# Patient Record
Sex: Female | Born: 1975 | Race: White | Hispanic: No | Marital: Married | Smoking: Never smoker
Health system: Southern US, Community
[De-identification: ages and names within clinical notes are randomized; demographics above are authoritative.]

## PROBLEM LIST (undated history)

## (undated) ENCOUNTER — Ambulatory Visit (HOSPITAL_COMMUNITY): Admission: EM | Source: Home / Self Care

## (undated) ENCOUNTER — Ambulatory Visit: Admission: EM

## (undated) DIAGNOSIS — Z789 Other specified health status: Secondary | ICD-10-CM

## (undated) DIAGNOSIS — F319 Bipolar disorder, unspecified: Secondary | ICD-10-CM

## (undated) HISTORY — PX: LEG SKIN LESION  BIOPSY / EXCISION: SUR473

## (undated) HISTORY — PX: OTHER SURGICAL HISTORY: SHX169

## (undated) HISTORY — DX: Bipolar disorder, unspecified: F31.9

---

## 2004-12-04 ENCOUNTER — Other Ambulatory Visit: Admission: RE | Admit: 2004-12-04 | Discharge: 2004-12-04 | Payer: Self-pay | Admitting: Obstetrics and Gynecology

## 2006-05-15 ENCOUNTER — Other Ambulatory Visit: Admission: RE | Admit: 2006-05-15 | Discharge: 2006-05-15 | Payer: Self-pay | Admitting: Obstetrics and Gynecology

## 2007-09-23 ENCOUNTER — Inpatient Hospital Stay (HOSPITAL_COMMUNITY): Admission: AD | Admit: 2007-09-23 | Discharge: 2007-09-23 | Payer: Self-pay | Admitting: Obstetrics and Gynecology

## 2007-09-24 ENCOUNTER — Inpatient Hospital Stay (HOSPITAL_COMMUNITY): Admission: AD | Admit: 2007-09-24 | Discharge: 2007-09-27 | Payer: Self-pay | Admitting: Obstetrics and Gynecology

## 2007-09-25 ENCOUNTER — Encounter (INDEPENDENT_AMBULATORY_CARE_PROVIDER_SITE_OTHER): Payer: Self-pay | Admitting: Obstetrics and Gynecology

## 2009-05-17 ENCOUNTER — Inpatient Hospital Stay (HOSPITAL_COMMUNITY): Admission: AD | Admit: 2009-05-17 | Discharge: 2009-05-20 | Payer: Self-pay | Admitting: Obstetrics and Gynecology

## 2010-04-09 LAB — CBC
HCT: 35.4 % — ABNORMAL LOW (ref 36.0–46.0)
Hemoglobin: 12 g/dL (ref 12.0–15.0)
MCV: 88.3 fL (ref 78.0–100.0)
RBC: 3.89 MIL/uL (ref 3.87–5.11)
WBC: 10.4 10*3/uL (ref 4.0–10.5)
WBC: 7.6 10*3/uL (ref 4.0–10.5)

## 2010-04-09 LAB — RPR: RPR Ser Ql: NONREACTIVE

## 2010-06-04 NOTE — Discharge Summary (Signed)
Janet Welch, Janet Welch                  ACCOUNT NO.:  [id number]   MEDICAL RECORD NO.:  [id number]          PATIENT TYPE:  INP   LOCATION:  9136                          FACILITY:  [facility]   PHYSICIAN:  Guy Sandifer. Henderson Cloud, M.D. DATE OF BIRTH:  [birth year]   DATE OF ADMISSION:  09/24/2007  DATE OF DISCHARGE:                               DISCHARGE SUMMARY   PROCEDURE:  Vacuum extraction.   INDICATIONS AND CONSENT:  This patient is a 35 year old married white  female G1, P0, at 37-5/7 weeks, who is admitted in labor.  Meconium was  noted.  An IUPC was placed for amnioinfusion.  The patient progresses to  complete and pushing.  She has been pushing for approximately 2 hours.  Vertex is at +3 station with caput.  However, there was no progress over  the last 45-60 minutes.  The procedure and options were discussed with  the patient and her husband.  The vacuum extraction with 1:40,000 risk  of severe morbidity or mortality as discussed.  All questions were  answered.  Straight cath was performed.  Kiwi vacuum extractor was  placed.  Over the course of 3-4 uterine contraction study progress was  made.  There was no pop-ups.  Vertex was delivered over a second-degree  midline episiotomy.  Oronasopharynx were suctioned with the DeLee  suction.  Baby was delivered and the cord was immediately clamped and  cut.  The baby's was handed to the awaiting pediatrics team.  A viable  female infant, Apgars of 9 and 9 at 1 and 5 minutes respectively with a  birth weight of 7 pounds 5 ounces.  Arterial cord pH of 7.36 noted.  Placenta with 3 vessels intact.  Estimated blood loss is 500 mL.  Placenta was sent to pathology for meconium staining.  Vagina and cervix  were without lesion.  A second-degree midline episiotomy was repaired.  The patient and infant are stable in labor and delivery room.      Guy Sandifer Henderson Cloud, M.D.  Electronically Signed     JET/MEDQ  D:  09/25/2007  T:  09/25/2007  Job:  [id number]

## 2010-09-05 LAB — HIV ANTIBODY (ROUTINE TESTING W REFLEX): HIV: NONREACTIVE

## 2010-09-05 LAB — RUBELLA ANTIBODY, IGM: Rubella: IMMUNE

## 2010-09-05 LAB — RPR: RPR: NONREACTIVE

## 2010-09-05 LAB — GC/CHLAMYDIA PROBE AMP, GENITAL

## 2010-10-23 LAB — CBC
HCT: 36.8
Hemoglobin: 12.1
MCHC: 32.7
MCHC: 32.8
MCV: 89.8
MCV: 90.1
Platelets: 224
RDW: 14

## 2010-10-23 LAB — CCBB MATERNAL DONOR DRAW

## 2010-11-06 ENCOUNTER — Other Ambulatory Visit (HOSPITAL_COMMUNITY): Payer: Self-pay | Admitting: Obstetrics and Gynecology

## 2010-11-06 DIAGNOSIS — Q632 Ectopic kidney: Secondary | ICD-10-CM

## 2010-11-08 ENCOUNTER — Ambulatory Visit (HOSPITAL_COMMUNITY): Payer: Self-pay

## 2011-01-29 ENCOUNTER — Ambulatory Visit (HOSPITAL_COMMUNITY)
Admission: RE | Admit: 2011-01-29 | Discharge: 2011-01-29 | Disposition: A | Discharge status: Home / Self Care | Payer: No Typology Code available for payment source | Source: Ambulatory Visit | Attending: Obstetrics and Gynecology | Admitting: Obstetrics and Gynecology

## 2011-01-29 DIAGNOSIS — O34599 Maternal care for other abnormalities of gravid uterus, unspecified trimester: Secondary | ICD-10-CM | POA: Insufficient documentation

## 2011-01-29 DIAGNOSIS — Q632 Ectopic kidney: Secondary | ICD-10-CM

## 2011-01-30 ENCOUNTER — Ambulatory Visit (HOSPITAL_COMMUNITY): Payer: Self-pay

## 2011-03-19 LAB — STREP B DNA PROBE: GBS: POSITIVE

## 2011-04-03 ENCOUNTER — Encounter (HOSPITAL_COMMUNITY): Payer: Self-pay | Admitting: *Deleted

## 2011-04-03 ENCOUNTER — Inpatient Hospital Stay (HOSPITAL_COMMUNITY)
Admission: RE | Admit: 2011-04-03 | Discharge: 2011-04-05 | DRG: 775 | Disposition: A | Discharge status: Home / Self Care | Payer: No Typology Code available for payment source | Source: Ambulatory Visit | Attending: Obstetrics and Gynecology | Admitting: Obstetrics and Gynecology

## 2011-04-03 ENCOUNTER — Encounter (HOSPITAL_COMMUNITY): Payer: Self-pay | Admitting: Anesthesiology

## 2011-04-03 ENCOUNTER — Inpatient Hospital Stay (HOSPITAL_COMMUNITY): Payer: No Typology Code available for payment source | Admitting: Anesthesiology

## 2011-04-03 DIAGNOSIS — O09529 Supervision of elderly multigravida, unspecified trimester: Secondary | ICD-10-CM | POA: Diagnosis present

## 2011-04-03 DIAGNOSIS — Z22330 Carrier of Group B streptococcus: Secondary | ICD-10-CM

## 2011-04-03 DIAGNOSIS — O99892 Other specified diseases and conditions complicating childbirth: Secondary | ICD-10-CM | POA: Diagnosis present

## 2011-04-03 HISTORY — DX: Other specified health status: Z78.9

## 2011-04-03 LAB — RPR: RPR Ser Ql: NONREACTIVE

## 2011-04-03 LAB — CBC
HCT: 36.3 % (ref 36.0–46.0)
Hemoglobin: 11.9 g/dL — ABNORMAL LOW (ref 12.0–15.0)
MCH: 28.7 pg (ref 26.0–34.0)
MCHC: 32.8 g/dL (ref 30.0–36.0)
MCV: 87.7 fL (ref 78.0–100.0)
Platelets: 212 10*3/uL (ref 150–400)
RBC: 4.14 MIL/uL (ref 3.87–5.11)
RDW: 14 % (ref 11.5–15.5)
WBC: 8.5 10*3/uL (ref 4.0–10.5)

## 2011-04-03 LAB — POCT FERN TEST: Fern Test: POSITIVE

## 2011-04-03 MED ORDER — CITRIC ACID-SODIUM CITRATE 334-500 MG/5ML PO SOLN: 30.0000 mL | ORAL | Status: DC | PRN

## 2011-04-03 MED ORDER — BISACODYL 10 MG RE SUPP: 10.0000 mg | Freq: Every day | RECTAL | Status: DC | PRN

## 2011-04-03 MED ORDER — SIMETHICONE 80 MG PO CHEW: 80.0000 mg | CHEWABLE_TABLET | ORAL | Status: DC | PRN

## 2011-04-03 MED ORDER — ONDANSETRON HCL 4 MG PO TABS: 4.0000 mg | ORAL_TABLET | ORAL | Status: DC | PRN

## 2011-04-03 MED ORDER — LACTATED RINGERS IV SOLN: 500.0000 mL | Freq: Once | INTRAVENOUS | Status: DC

## 2011-04-03 MED ORDER — PHENYLEPHRINE 40 MCG/ML (10ML) SYRINGE FOR IV PUSH (FOR BLOOD PRESSURE SUPPORT)
80.0000 ug | PREFILLED_SYRINGE | INTRAVENOUS | Status: DC | PRN
  Filled 2011-04-03: qty 2
  Filled 2011-04-03: qty 5

## 2011-04-03 MED ORDER — FENTANYL 2.5 MCG/ML BUPIVACAINE 1/10 % EPIDURAL INFUSION (WH - ANES)
14.0000 mL/h | INTRAMUSCULAR | Status: DC
  Administered 2011-04-03: 14 mL/h via EPIDURAL
  Filled 2011-04-03 (×2): qty 60

## 2011-04-03 MED ORDER — PHENYLEPHRINE 40 MCG/ML (10ML) SYRINGE FOR IV PUSH (FOR BLOOD PRESSURE SUPPORT)
80.0000 ug | PREFILLED_SYRINGE | INTRAVENOUS | Status: DC | PRN
  Filled 2011-04-03: qty 2

## 2011-04-03 MED ORDER — IBUPROFEN 600 MG PO TABS
600.0000 mg | ORAL_TABLET | Freq: Four times a day (QID) | ORAL | Status: DC | PRN
  Filled 2011-04-03: qty 1

## 2011-04-03 MED ORDER — EPHEDRINE 5 MG/ML INJ
10.0000 mg | INTRAVENOUS | Status: DC | PRN
  Filled 2011-04-03: qty 2
  Filled 2011-04-03: qty 4

## 2011-04-03 MED ORDER — DIBUCAINE 1 % RE OINT: 1.0000 "application " | TOPICAL_OINTMENT | RECTAL | Status: DC | PRN

## 2011-04-03 MED ORDER — PENICILLIN G POTASSIUM 5000000 UNITS IJ SOLR
2.5000 10*6.[IU] | INTRAVENOUS | Status: DC
  Administered 2011-04-03: 2.5 10*6.[IU] via INTRAVENOUS
  Filled 2011-04-03 (×3): qty 2.5

## 2011-04-03 MED ORDER — BENZOCAINE-MENTHOL 20-0.5 % EX AERO
INHALATION_SPRAY | CUTANEOUS | Status: AC
  Filled 2011-04-03: qty 56

## 2011-04-03 MED ORDER — OXYCODONE-ACETAMINOPHEN 5-325 MG PO TABS: 1.0000 | ORAL_TABLET | ORAL | Status: DC | PRN

## 2011-04-03 MED ORDER — SENNOSIDES-DOCUSATE SODIUM 8.6-50 MG PO TABS
2.0000 | ORAL_TABLET | Freq: Every day | ORAL | Status: DC
  Administered 2011-04-03 – 2011-04-04 (×2): 2 via ORAL

## 2011-04-03 MED ORDER — SENNOSIDES-DOCUSATE SODIUM 8.6-50 MG PO TABS: 2.0000 | ORAL_TABLET | Freq: Every day | ORAL | Status: DC

## 2011-04-03 MED ORDER — ZOLPIDEM TARTRATE 5 MG PO TABS: 5.0000 mg | ORAL_TABLET | Freq: Every evening | ORAL | Status: DC | PRN

## 2011-04-03 MED ORDER — FENTANYL 2.5 MCG/ML BUPIVACAINE 1/10 % EPIDURAL INFUSION (WH - ANES)
INTRAMUSCULAR | Status: DC | PRN
  Administered 2011-04-03: 14 mL/h via EPIDURAL

## 2011-04-03 MED ORDER — BENZOCAINE-MENTHOL 20-0.5 % EX AERO: 1.0000 "application " | INHALATION_SPRAY | CUTANEOUS | Status: DC | PRN

## 2011-04-03 MED ORDER — PRENATAL MULTIVITAMIN CH: 1.0000 | ORAL_TABLET | Freq: Every day | ORAL | Status: DC

## 2011-04-03 MED ORDER — FLEET ENEMA 7-19 GM/118ML RE ENEM: 1.0000 | ENEMA | Freq: Every day | RECTAL | Status: DC | PRN

## 2011-04-03 MED ORDER — EPHEDRINE 5 MG/ML INJ
10.0000 mg | INTRAVENOUS | Status: DC | PRN
  Filled 2011-04-03: qty 2

## 2011-04-03 MED ORDER — LACTATED RINGERS IV SOLN
INTRAVENOUS | Status: DC
  Administered 2011-04-03: 04:00:00 via INTRAVENOUS

## 2011-04-03 MED ORDER — TETANUS-DIPHTH-ACELL PERTUSSIS 5-2.5-18.5 LF-MCG/0.5 IM SUSP: 0.5000 mL | Freq: Once | INTRAMUSCULAR | Status: DC

## 2011-04-03 MED ORDER — ONDANSETRON HCL 4 MG/2ML IJ SOLN: 4.0000 mg | INTRAMUSCULAR | Status: DC | PRN

## 2011-04-03 MED ORDER — ACETAMINOPHEN 325 MG PO TABS: 650.0000 mg | ORAL_TABLET | ORAL | Status: DC | PRN

## 2011-04-03 MED ORDER — OXYTOCIN 20 UNITS IN LACTATED RINGERS INFUSION - SIMPLE
125.0000 mL/h | Freq: Once | INTRAVENOUS | Status: AC
  Administered 2011-04-03: 125 mL/h via INTRAVENOUS

## 2011-04-03 MED ORDER — OXYTOCIN BOLUS FROM INFUSION
500.0000 mL | Freq: Once | INTRAVENOUS | Status: AC
  Administered 2011-04-03: 500 mL via INTRAVENOUS
  Filled 2011-04-03: qty 500
  Filled 2011-04-03: qty 1000

## 2011-04-03 MED ORDER — PRENATAL MULTIVITAMIN CH
1.0000 | ORAL_TABLET | Freq: Every day | ORAL | Status: DC
  Administered 2011-04-05: 1 via ORAL
  Filled 2011-04-03 (×2): qty 1

## 2011-04-03 MED ORDER — DEXTROSE 5 % IV SOLN
5.0000 10*6.[IU] | Freq: Once | INTRAVENOUS | Status: AC
  Administered 2011-04-03: 5 10*6.[IU] via INTRAVENOUS
  Filled 2011-04-03: qty 5

## 2011-04-03 MED ORDER — DIPHENHYDRAMINE HCL 25 MG PO CAPS: 25.0000 mg | ORAL_CAPSULE | Freq: Four times a day (QID) | ORAL | Status: DC | PRN

## 2011-04-03 MED ORDER — LACTATED RINGERS IV SOLN: 500.0000 mL | INTRAVENOUS | Status: DC | PRN

## 2011-04-03 MED ORDER — WITCH HAZEL-GLYCERIN EX PADS: 1.0000 "application " | MEDICATED_PAD | CUTANEOUS | Status: DC | PRN

## 2011-04-03 MED ORDER — DIPHENHYDRAMINE HCL 50 MG/ML IJ SOLN: 12.5000 mg | INTRAMUSCULAR | Status: DC | PRN

## 2011-04-03 MED ORDER — IBUPROFEN 600 MG PO TABS
600.0000 mg | ORAL_TABLET | Freq: Four times a day (QID) | ORAL | Status: DC
  Administered 2011-04-03: 600 mg via ORAL

## 2011-04-03 MED ORDER — FLEET ENEMA 7-19 GM/118ML RE ENEM: 1.0000 | ENEMA | RECTAL | Status: DC | PRN

## 2011-04-03 MED ORDER — LANOLIN HYDROUS EX OINT: TOPICAL_OINTMENT | CUTANEOUS | Status: DC | PRN

## 2011-04-03 MED ORDER — ONDANSETRON HCL 4 MG/2ML IJ SOLN: 4.0000 mg | Freq: Four times a day (QID) | INTRAMUSCULAR | Status: DC | PRN

## 2011-04-03 MED ORDER — SODIUM BICARBONATE 8.4 % IV SOLN
INTRAVENOUS | Status: DC | PRN
  Administered 2011-04-03: 4 mL via EPIDURAL

## 2011-04-03 MED ORDER — LIDOCAINE HCL (PF) 1 % IJ SOLN
30.0000 mL | INTRAMUSCULAR | Status: DC | PRN
  Filled 2011-04-03 (×2): qty 30

## 2011-04-03 MED ORDER — IBUPROFEN 600 MG PO TABS
600.0000 mg | ORAL_TABLET | Freq: Four times a day (QID) | ORAL | Status: DC
  Administered 2011-04-03 – 2011-04-05 (×8): 600 mg via ORAL
  Filled 2011-04-03 (×8): qty 1

## 2011-04-03 MED ORDER — BENZOCAINE-MENTHOL 20-0.5 % EX AERO
1.0000 "application " | INHALATION_SPRAY | CUTANEOUS | Status: DC | PRN
  Administered 2011-04-03: 1 via TOPICAL

## 2011-04-03 NOTE — Progress Notes (Signed)
Dr. Henderson Cloud notified of pt presenting for labor check.  notified of + fern and GBS+ with VE 4-5/100/-2.  Admit orders received.

## 2011-04-03 NOTE — Progress Notes (Signed)
Pushing since about 6:30am Took about 30 min break in exagerrated Simms Tired, requests help Vtx at +3, ROP D/W pt/husband VE and  risk of severe morbidity or mortality All questions answered

## 2011-04-03 NOTE — H&P (Signed)
Janet Welch is a 36 y.o. female presenting for SROM and UCs starting about 3 am.  No CNS change/epigastric pain. Maternal Medical History:  Reason for admission: Reason for admission: rupture of membranes and contractions.  Contractions: Onset was 3-5 hours ago.    Fetal activity: Perceived fetal activity is normal.      OB History    Grav Para Term Preterm Abortions TAB SAB Ect Mult Living   4 2 2       2      Past Medical History  Diagnosis Date  . No pertinent past medical history    Past Surgical History  Procedure Date  . Scar tissue biopsy    Family History: family history is not on file. Social History:  reports that she has never smoked. She does not have any smokeless tobacco history on file. She reports that she does not drink alcohol or use illicit drugs.  Review of Systems  Eyes: Negative for blurred vision.  Gastrointestinal: Negative for abdominal pain.  Neurological: Negative for headaches.    Dilation: 7 Effacement (%): 100 Station: 0 Exam by:: H.Norton, RN  Blood pressure 105/38, pulse 69, temperature 97.8 F (36.6 C), temperature source Oral, resp. rate 20, height 5\' 7"  (1.702 m), weight 81.647 kg (180 lb), last menstrual period 06/30/2010, SpO2 100.00%. Maternal Exam:  Uterine Assessment: Contraction strength is firm.  Contraction frequency is regular.      Fetal Exam Fetal Monitor Review: Pattern: accelerations present.       Physical Exam  Cardiovascular: Normal rate and regular rhythm.   Respiratory: Effort normal and breath sounds normal.  GI: There is no tenderness.  Neurological: She has normal reflexes.   Cx  Lt rim/C/+1  (+2 with UC) Prenatal labs: ABO, Rh: B/Positive/-- (08/16 0000) Antibody: Negative (08/16 0000) Rubella: Immune (08/16 0000) RPR: Nonreactive (08/16 0000)  HBsAg: Negative (08/16 0000)  HIV: Non-reactive (08/16 0000)  GBS: Positive (02/27 0000)   Assessment/Plan: 36 yo G4P2 at 41 4/7 weeks with SROM and  labor. Anticipate vaginal delivery.  Juley Giovanetti II,Lashell Moffitt E 04/03/2011, 6:32 AM

## 2011-04-03 NOTE — Progress Notes (Signed)
Delivery Note Foley cath removed Kiwi applied-handle came apart, separated from tubing Another Kiwi applied, steady progress, popoff x 2 ROT, asynclytic VMI Apgars 8/9, weight 8#  11oz Art pH insufficient blood per lab report Placenta 3 vessels, intact, EBL 350cc cx vagina intact Second degree MLE repaired Pt/infant stable in LDR

## 2011-04-03 NOTE — Anesthesia Preprocedure Evaluation (Signed)

## 2011-04-03 NOTE — Anesthesia Procedure Notes (Signed)

## 2011-04-03 NOTE — MAU Note (Signed)
Pt G3 P2 , SVE 4-5cm today in the office.  Contracting every .

## 2011-04-03 NOTE — Progress Notes (Signed)
Pt may go to room 169. 

## 2011-04-04 ENCOUNTER — Encounter (HOSPITAL_COMMUNITY): Payer: Self-pay | Admitting: *Deleted

## 2011-04-04 LAB — CBC
HCT: 36.8 % (ref 36.0–46.0)
Hemoglobin: 11.8 g/dL — ABNORMAL LOW (ref 12.0–15.0)
MCH: 28.4 pg (ref 26.0–34.0)
MCHC: 32.1 g/dL (ref 30.0–36.0)
MCV: 88.7 fL (ref 78.0–100.0)
RBC: 4.15 MIL/uL (ref 3.87–5.11)

## 2011-04-04 NOTE — Addendum Note (Signed)
Addendum  created 04/04/11 1446 by Lorenzo Pereyra O Claris Pech, CRNA   Modules edited:Charges VN, Notes Section    

## 2011-04-04 NOTE — Addendum Note (Signed)
Addendum  created 04/04/11 1446 by Graciela Husbands, CRNA   Modules edited:Charges VN, Notes Section

## 2011-04-04 NOTE — Progress Notes (Signed)
Post Partum Day 1 Subjective: no complaints and tolerating PO  Objective: Blood pressure 113/63, pulse 62, temperature 97.4 F (36.3 C), temperature source Oral, resp. rate 18, height 5\' 7"  (1.702 m), weight 81.647 kg (180 lb), last menstrual period 06/30/2010, SpO2 100.00%, unknown if currently breastfeeding.  Physical Exam:  General: alert and cooperative Lochia: appropriate Uterine Fundus: firm Incision: n/a DVT Evaluation: No evidence of DVT seen on physical exam.   Basename 04/04/11 0510 04/03/11 0417  HGB 11.8* 11.9*  HCT 36.8 36.3    Assessment/Plan: Plan for discharge tomorrow   LOS: 1 day   Janet Welch 04/04/2011, 9:03 AM

## 2011-04-04 NOTE — Anesthesia Postprocedure Evaluation (Signed)
  Anesthesia Post-op Note  Patient: Janet Welch  Procedure(s) Performed: * No procedures listed *  Patient Location: 131  Anesthesia Type: Epidural  Level of Consciousness: awake, alert  and oriented  Airway and Oxygen Therapy: Patient Spontanous Breathing  Post-op Pain: mild  Post-op Assessment: Post-op Vital signs reviewed, Patient's Cardiovascular Status Stable, No headache, No backache, No residual numbness and No residual motor weakness  Post-op Vital Signs: Reviewed and stable  Complications: No apparent anesthesia complications

## 2011-04-05 NOTE — Discharge Summary (Signed)
Obstetric Discharge Summary Reason for Admission: onset of labor Prenatal Procedures: ultrasound Intrapartum Procedures: spontaneous vaginal delivery Postpartum Procedures: none Complications-Operative and Postpartum: none Hemoglobin  Date Value Range Status  04/04/2011 11.8* 12.0-15.0 (g/dL) Final     HCT  Date Value Range Status  04/04/2011 36.8  36.0-46.0 (%) Final    Physical Exam:  General: alert and cooperative Lochia: appropriate Uterine Fundus: firm Incision: n/a DVT Evaluation: No evidence of DVT seen on physical exam.  Discharge Diagnoses: Term Pregnancy-delivered  Discharge Information: Date: 04/05/2011 Activity: pelvic rest Diet: routine Medications: PNV and Ibuprofen Condition: stable Instructions: refer to practice specific booklet Discharge to: home Follow-up Information    Schedule an appointment as soon as possible for a visit in 5 weeks to follow up.         Newborn Data: Live born female  Birth Weight: 8 lb 11.7 oz (3960 g) APGAR: 8, 9  Home with mother.  Alfio Loescher 04/05/2011, 9:27 AM

## 2011-10-10 ENCOUNTER — Other Ambulatory Visit: Payer: Self-pay | Admitting: Podiatry

## 2012-12-03 LAB — OB RESULTS CONSOLE HIV ANTIBODY (ROUTINE TESTING): HIV: NONREACTIVE

## 2012-12-03 LAB — OB RESULTS CONSOLE ABO/RH: RH TYPE: POSITIVE

## 2012-12-03 LAB — OB RESULTS CONSOLE ANTIBODY SCREEN: Antibody Screen: NEGATIVE

## 2012-12-03 LAB — OB RESULTS CONSOLE RPR: RPR: NONREACTIVE

## 2012-12-03 LAB — OB RESULTS CONSOLE RUBELLA ANTIBODY, IGM: RUBELLA: IMMUNE

## 2012-12-03 LAB — OB RESULTS CONSOLE HEPATITIS B SURFACE ANTIGEN: Hepatitis B Surface Ag: NEGATIVE

## 2012-12-15 LAB — OB RESULTS CONSOLE GC/CHLAMYDIA
Chlamydia: NEGATIVE
Gonorrhea: NEGATIVE

## 2013-06-15 LAB — OB RESULTS CONSOLE GBS: STREP GROUP B AG: POSITIVE

## 2013-07-06 ENCOUNTER — Inpatient Hospital Stay (HOSPITAL_COMMUNITY)
Admission: AD | Admit: 2013-07-06 | Discharge: 2013-07-10 | DRG: 766 | Disposition: A | Discharge status: Home / Self Care | Payer: BC Managed Care – PPO | Source: Ambulatory Visit | Attending: Obstetrics & Gynecology | Admitting: Obstetrics & Gynecology

## 2013-07-06 ENCOUNTER — Encounter (HOSPITAL_COMMUNITY): Payer: Self-pay | Admitting: *Deleted

## 2013-07-06 DIAGNOSIS — O99892 Other specified diseases and conditions complicating childbirth: Secondary | ICD-10-CM | POA: Diagnosis present

## 2013-07-06 DIAGNOSIS — O324XX0 Maternal care for high head at term, not applicable or unspecified: Secondary | ICD-10-CM | POA: Diagnosis present

## 2013-07-06 DIAGNOSIS — IMO0001 Reserved for inherently not codable concepts without codable children: Secondary | ICD-10-CM

## 2013-07-06 DIAGNOSIS — Z302 Encounter for sterilization: Secondary | ICD-10-CM

## 2013-07-06 DIAGNOSIS — O9989 Other specified diseases and conditions complicating pregnancy, childbirth and the puerperium: Secondary | ICD-10-CM

## 2013-07-06 DIAGNOSIS — O09529 Supervision of elderly multigravida, unspecified trimester: Secondary | ICD-10-CM | POA: Diagnosis present

## 2013-07-06 DIAGNOSIS — Z22330 Carrier of Group B streptococcus: Secondary | ICD-10-CM

## 2013-07-06 DIAGNOSIS — Z98891 History of uterine scar from previous surgery: Secondary | ICD-10-CM

## 2013-07-06 LAB — CBC
HCT: 37 % (ref 36.0–46.0)
HEMOGLOBIN: 12.4 g/dL (ref 12.0–15.0)
MCH: 29.1 pg (ref 26.0–34.0)
MCHC: 33.5 g/dL (ref 30.0–36.0)
MCV: 86.9 fL (ref 78.0–100.0)
Platelets: 261 10*3/uL (ref 150–400)
RBC: 4.26 MIL/uL (ref 3.87–5.11)
RDW: 13.9 % (ref 11.5–15.5)
WBC: 10.6 10*3/uL — ABNORMAL HIGH (ref 4.0–10.5)

## 2013-07-06 MED ORDER — CITRIC ACID-SODIUM CITRATE 334-500 MG/5ML PO SOLN
30.0000 mL | ORAL | Status: DC | PRN
  Administered 2013-07-07: 30 mL via ORAL
  Filled 2013-07-06: qty 15

## 2013-07-06 MED ORDER — LACTATED RINGERS IV SOLN: 500.0000 mL | INTRAVENOUS | Status: DC | PRN

## 2013-07-06 MED ORDER — ACETAMINOPHEN 325 MG PO TABS: 650.0000 mg | ORAL_TABLET | ORAL | Status: DC | PRN

## 2013-07-06 MED ORDER — PENICILLIN G POTASSIUM 5000000 UNITS IJ SOLR
2.5000 10*6.[IU] | INTRAVENOUS | Status: DC
  Administered 2013-07-07 (×4): 2.5 10*6.[IU] via INTRAVENOUS
  Filled 2013-07-06 (×7): qty 2.5

## 2013-07-06 MED ORDER — LACTATED RINGERS IV SOLN
500.0000 mL | Freq: Once | INTRAVENOUS | Status: AC
  Administered 2013-07-07: 500 mL via INTRAVENOUS

## 2013-07-06 MED ORDER — DIPHENHYDRAMINE HCL 50 MG/ML IJ SOLN: 12.5000 mg | INTRAMUSCULAR | Status: DC | PRN

## 2013-07-06 MED ORDER — EPHEDRINE 5 MG/ML INJ
10.0000 mg | INTRAVENOUS | Status: DC | PRN
  Filled 2013-07-06: qty 2
  Filled 2013-07-06: qty 4

## 2013-07-06 MED ORDER — EPHEDRINE 5 MG/ML INJ
10.0000 mg | INTRAVENOUS | Status: DC | PRN
  Filled 2013-07-06: qty 2

## 2013-07-06 MED ORDER — ONDANSETRON HCL 4 MG/2ML IJ SOLN: 4.0000 mg | Freq: Four times a day (QID) | INTRAMUSCULAR | Status: DC | PRN

## 2013-07-06 MED ORDER — IBUPROFEN 600 MG PO TABS: 600.0000 mg | ORAL_TABLET | Freq: Four times a day (QID) | ORAL | Status: DC | PRN

## 2013-07-06 MED ORDER — OXYCODONE-ACETAMINOPHEN 5-325 MG PO TABS: 1.0000 | ORAL_TABLET | ORAL | Status: DC | PRN

## 2013-07-06 MED ORDER — PHENYLEPHRINE 40 MCG/ML (10ML) SYRINGE FOR IV PUSH (FOR BLOOD PRESSURE SUPPORT)
80.0000 ug | PREFILLED_SYRINGE | INTRAVENOUS | Status: DC | PRN
  Filled 2013-07-06: qty 2

## 2013-07-06 MED ORDER — OXYTOCIN 40 UNITS IN LACTATED RINGERS INFUSION - SIMPLE MED
62.5000 mL/h | INTRAVENOUS | Status: DC
Start: 2013-07-06 — End: 2013-07-07

## 2013-07-06 MED ORDER — OXYTOCIN BOLUS FROM INFUSION: 500.0000 mL | INTRAVENOUS | Status: DC

## 2013-07-06 MED ORDER — PENICILLIN G POTASSIUM 5000000 UNITS IJ SOLR
5.0000 10*6.[IU] | Freq: Once | INTRAVENOUS | Status: AC
  Administered 2013-07-06: 5 10*6.[IU] via INTRAVENOUS
  Filled 2013-07-06: qty 5

## 2013-07-06 MED ORDER — LIDOCAINE HCL (PF) 1 % IJ SOLN: 30.0000 mL | INTRAMUSCULAR | Status: DC | PRN

## 2013-07-06 MED ORDER — FLEET ENEMA 7-19 GM/118ML RE ENEM: 1.0000 | ENEMA | RECTAL | Status: DC | PRN

## 2013-07-06 MED ORDER — PHENYLEPHRINE 40 MCG/ML (10ML) SYRINGE FOR IV PUSH (FOR BLOOD PRESSURE SUPPORT)
80.0000 ug | PREFILLED_SYRINGE | INTRAVENOUS | Status: DC | PRN
  Filled 2013-07-06: qty 2
  Filled 2013-07-06: qty 10

## 2013-07-06 MED ORDER — LACTATED RINGERS IV SOLN
INTRAVENOUS | Status: DC
  Administered 2013-07-06: 23:00:00 via INTRAVENOUS
  Administered 2013-07-07: 125 mL/h via INTRAVENOUS
  Administered 2013-07-07 (×5): via INTRAVENOUS

## 2013-07-06 MED ORDER — FENTANYL 2.5 MCG/ML BUPIVACAINE 1/10 % EPIDURAL INFUSION (WH - ANES)
14.0000 mL/h | INTRAMUSCULAR | Status: DC | PRN
  Administered 2013-07-07 (×2): 14 mL/h via EPIDURAL
  Filled 2013-07-06 (×2): qty 125

## 2013-07-07 ENCOUNTER — Encounter (HOSPITAL_COMMUNITY)
Admission: AD | Disposition: A | Discharge status: Home / Self Care | Payer: Self-pay | Source: Ambulatory Visit | Attending: Obstetrics & Gynecology

## 2013-07-07 ENCOUNTER — Inpatient Hospital Stay (HOSPITAL_COMMUNITY): Payer: BC Managed Care – PPO | Admitting: Anesthesiology

## 2013-07-07 ENCOUNTER — Encounter (HOSPITAL_COMMUNITY): Payer: BC Managed Care – PPO | Admitting: Anesthesiology

## 2013-07-07 ENCOUNTER — Inpatient Hospital Stay (HOSPITAL_COMMUNITY): Admission: RE | Admit: 2013-07-07 | Payer: No Typology Code available for payment source | Source: Ambulatory Visit

## 2013-07-07 ENCOUNTER — Encounter (HOSPITAL_COMMUNITY): Payer: Self-pay | Admitting: *Deleted

## 2013-07-07 DIAGNOSIS — Z98891 History of uterine scar from previous surgery: Secondary | ICD-10-CM

## 2013-07-07 LAB — RPR

## 2013-07-07 SURGERY — Surgical Case
Anesthesia: Epidural

## 2013-07-07 MED ORDER — LACTATED RINGERS IV SOLN
INTRAVENOUS | Status: DC
  Administered 2013-07-08: 05:00:00 via INTRAVENOUS

## 2013-07-07 MED ORDER — MEPERIDINE HCL 25 MG/ML IJ SOLN: 6.2500 mg | INTRAMUSCULAR | Status: DC | PRN

## 2013-07-07 MED ORDER — SIMETHICONE 80 MG PO CHEW
80.0000 mg | CHEWABLE_TABLET | ORAL | Status: DC
  Filled 2013-07-07 (×2): qty 1

## 2013-07-07 MED ORDER — IBUPROFEN 600 MG PO TABS: 600.0000 mg | ORAL_TABLET | Freq: Four times a day (QID) | ORAL | Status: DC | PRN

## 2013-07-07 MED ORDER — SODIUM BICARBONATE 8.4 % IV SOLN
INTRAVENOUS | Status: DC | PRN
  Administered 2013-07-07 (×2): 5 mL via EPIDURAL

## 2013-07-07 MED ORDER — DIPHENHYDRAMINE HCL 25 MG PO CAPS: 25.0000 mg | ORAL_CAPSULE | Freq: Four times a day (QID) | ORAL | Status: DC | PRN

## 2013-07-07 MED ORDER — MENTHOL 3 MG MT LOZG: 1.0000 | LOZENGE | OROMUCOSAL | Status: DC | PRN

## 2013-07-07 MED ORDER — PRENATAL MULTIVITAMIN CH
1.0000 | ORAL_TABLET | Freq: Every day | ORAL | Status: DC
  Administered 2013-07-08 – 2013-07-09 (×2): 1 via ORAL
  Filled 2013-07-07 (×2): qty 1

## 2013-07-07 MED ORDER — OXYTOCIN 40 UNITS IN LACTATED RINGERS INFUSION - SIMPLE MED: 62.5000 mL/h | INTRAVENOUS | Status: AC

## 2013-07-07 MED ORDER — KETOROLAC TROMETHAMINE 30 MG/ML IJ SOLN
30.0000 mg | Freq: Four times a day (QID) | INTRAMUSCULAR | Status: AC | PRN
  Administered 2013-07-07: 30 mg via INTRAMUSCULAR

## 2013-07-07 MED ORDER — ACETAMINOPHEN 500 MG PO TABS: 1000.0000 mg | ORAL_TABLET | Freq: Four times a day (QID) | ORAL | Status: AC

## 2013-07-07 MED ORDER — SODIUM CHLORIDE 0.9 % IJ SOLN: 3.0000 mL | INTRAMUSCULAR | Status: DC | PRN

## 2013-07-07 MED ORDER — ZOLPIDEM TARTRATE 5 MG PO TABS: 5.0000 mg | ORAL_TABLET | Freq: Every evening | ORAL | Status: DC | PRN

## 2013-07-07 MED ORDER — ONDANSETRON HCL 4 MG/2ML IJ SOLN
INTRAMUSCULAR | Status: AC
  Filled 2013-07-07: qty 2

## 2013-07-07 MED ORDER — DIPHENHYDRAMINE HCL 50 MG/ML IJ SOLN: 25.0000 mg | INTRAMUSCULAR | Status: DC | PRN

## 2013-07-07 MED ORDER — ONDANSETRON HCL 4 MG/2ML IJ SOLN
INTRAMUSCULAR | Status: DC | PRN
  Administered 2013-07-07: 4 mg via INTRAVENOUS

## 2013-07-07 MED ORDER — NALBUPHINE HCL 10 MG/ML IJ SOLN: 5.0000 mg | INTRAMUSCULAR | Status: DC | PRN

## 2013-07-07 MED ORDER — MORPHINE SULFATE (PF) 0.5 MG/ML IJ SOLN
INTRAMUSCULAR | Status: DC | PRN
  Administered 2013-07-07: 4 mg via EPIDURAL

## 2013-07-07 MED ORDER — KETOROLAC TROMETHAMINE 30 MG/ML IJ SOLN
INTRAMUSCULAR | Status: AC
  Administered 2013-07-07: 30 mg via INTRAMUSCULAR
  Filled 2013-07-07: qty 1

## 2013-07-07 MED ORDER — CEFAZOLIN SODIUM-DEXTROSE 2-3 GM-% IV SOLR
2.0000 g | Freq: Once | INTRAVENOUS | Status: AC
  Administered 2013-07-07 (×2): 2 g via INTRAVENOUS
  Filled 2013-07-07: qty 50

## 2013-07-07 MED ORDER — TERBUTALINE SULFATE 1 MG/ML IJ SOLN
0.2500 mg | Freq: Once | INTRAMUSCULAR | Status: DC | PRN
Start: 2013-07-07 — End: 2013-07-07

## 2013-07-07 MED ORDER — LANOLIN HYDROUS EX OINT: 1.0000 "application " | TOPICAL_OINTMENT | CUTANEOUS | Status: DC | PRN

## 2013-07-07 MED ORDER — KETOROLAC TROMETHAMINE 30 MG/ML IJ SOLN
30.0000 mg | Freq: Four times a day (QID) | INTRAMUSCULAR | Status: AC | PRN
  Administered 2013-07-08: 30 mg via INTRAVENOUS
  Filled 2013-07-07: qty 1

## 2013-07-07 MED ORDER — SCOPOLAMINE 1 MG/3DAYS TD PT72
MEDICATED_PATCH | TRANSDERMAL | Status: AC
  Administered 2013-07-07: 1.5 mg via TRANSDERMAL
  Filled 2013-07-07: qty 1

## 2013-07-07 MED ORDER — DIPHENHYDRAMINE HCL 50 MG/ML IJ SOLN: 12.5000 mg | INTRAMUSCULAR | Status: DC | PRN

## 2013-07-07 MED ORDER — NALOXONE HCL 1 MG/ML IJ SOLN: 1.0000 ug/kg/h | INTRAVENOUS | Status: DC | PRN

## 2013-07-07 MED ORDER — ONDANSETRON HCL 4 MG/2ML IJ SOLN: 4.0000 mg | Freq: Three times a day (TID) | INTRAMUSCULAR | Status: DC | PRN

## 2013-07-07 MED ORDER — FENTANYL 2.5 MCG/ML BUPIVACAINE 1/10 % EPIDURAL INFUSION (WH - ANES)
INTRAMUSCULAR | Status: DC | PRN
  Administered 2013-07-07: 14 mL/h via EPIDURAL

## 2013-07-07 MED ORDER — SIMETHICONE 80 MG PO CHEW
80.0000 mg | CHEWABLE_TABLET | ORAL | Status: DC | PRN
  Administered 2013-07-08: 80 mg via ORAL

## 2013-07-07 MED ORDER — OXYTOCIN 10 UNIT/ML IJ SOLN
INTRAMUSCULAR | Status: AC
  Filled 2013-07-07: qty 4

## 2013-07-07 MED ORDER — MORPHINE SULFATE 0.5 MG/ML IJ SOLN
INTRAMUSCULAR | Status: AC
  Filled 2013-07-07: qty 10

## 2013-07-07 MED ORDER — DIBUCAINE 1 % RE OINT: 1.0000 "application " | TOPICAL_OINTMENT | RECTAL | Status: DC | PRN

## 2013-07-07 MED ORDER — OXYTOCIN 10 UNIT/ML IJ SOLN
40.0000 [IU] | INTRAVENOUS | Status: DC | PRN
  Administered 2013-07-07: 40 [IU] via INTRAVENOUS

## 2013-07-07 MED ORDER — SODIUM BICARBONATE 8.4 % IV SOLN
INTRAVENOUS | Status: AC
  Filled 2013-07-07: qty 50

## 2013-07-07 MED ORDER — NALOXONE HCL 0.4 MG/ML IJ SOLN: 0.4000 mg | INTRAMUSCULAR | Status: DC | PRN

## 2013-07-07 MED ORDER — LACTATED RINGERS IV SOLN
INTRAVENOUS | Status: DC | PRN
  Administered 2013-07-07: 16:00:00 via INTRAVENOUS

## 2013-07-07 MED ORDER — OXYTOCIN 40 UNITS IN LACTATED RINGERS INFUSION - SIMPLE MED
1.0000 m[IU]/min | INTRAVENOUS | Status: DC
Start: 2013-07-07 — End: 2013-07-07
  Administered 2013-07-07: 2 m[IU]/min via INTRAVENOUS
  Filled 2013-07-07: qty 1000

## 2013-07-07 MED ORDER — MEPERIDINE HCL 25 MG/ML IJ SOLN
INTRAMUSCULAR | Status: AC
  Filled 2013-07-07: qty 1

## 2013-07-07 MED ORDER — LIDOCAINE HCL (PF) 1 % IJ SOLN
INTRAMUSCULAR | Status: DC | PRN
  Administered 2013-07-07 (×2): 5 mL
  Administered 2013-07-07: 3 mL

## 2013-07-07 MED ORDER — ONDANSETRON HCL 4 MG/2ML IJ SOLN
4.0000 mg | INTRAMUSCULAR | Status: DC | PRN
  Administered 2013-07-07: 4 mg via INTRAVENOUS

## 2013-07-07 MED ORDER — SIMETHICONE 80 MG PO CHEW
80.0000 mg | CHEWABLE_TABLET | Freq: Three times a day (TID) | ORAL | Status: DC
  Administered 2013-07-08 – 2013-07-10 (×5): 80 mg via ORAL
  Filled 2013-07-07 (×3): qty 1

## 2013-07-07 MED ORDER — LIDOCAINE-EPINEPHRINE (PF) 2 %-1:200000 IJ SOLN
INTRAMUSCULAR | Status: AC
  Filled 2013-07-07: qty 20

## 2013-07-07 MED ORDER — FENTANYL CITRATE 0.05 MG/ML IJ SOLN: 25.0000 ug | INTRAMUSCULAR | Status: DC | PRN

## 2013-07-07 MED ORDER — SENNOSIDES-DOCUSATE SODIUM 8.6-50 MG PO TABS
2.0000 | ORAL_TABLET | ORAL | Status: DC
  Administered 2013-07-08 – 2013-07-09 (×2): 2 via ORAL
  Filled 2013-07-07 (×2): qty 2

## 2013-07-07 MED ORDER — SCOPOLAMINE 1 MG/3DAYS TD PT72
1.0000 | MEDICATED_PATCH | Freq: Once | TRANSDERMAL | Status: DC
  Administered 2013-07-07: 1.5 mg via TRANSDERMAL

## 2013-07-07 MED ORDER — PROMETHAZINE HCL 25 MG/ML IJ SOLN: 6.2500 mg | INTRAMUSCULAR | Status: DC | PRN

## 2013-07-07 MED ORDER — TETANUS-DIPHTH-ACELL PERTUSSIS 5-2.5-18.5 LF-MCG/0.5 IM SUSP
0.5000 mL | Freq: Once | INTRAMUSCULAR | Status: DC
Start: 2013-07-08 — End: 2013-07-08

## 2013-07-07 MED ORDER — METOCLOPRAMIDE HCL 5 MG/ML IJ SOLN
10.0000 mg | Freq: Three times a day (TID) | INTRAMUSCULAR | Status: DC | PRN
Start: 2013-07-07 — End: 2013-07-10
  Administered 2013-07-08: 10 mg via INTRAVENOUS
  Filled 2013-07-07: qty 2

## 2013-07-07 MED ORDER — DIPHENHYDRAMINE HCL 25 MG PO CAPS: 25.0000 mg | ORAL_CAPSULE | ORAL | Status: DC | PRN

## 2013-07-07 MED ORDER — WITCH HAZEL-GLYCERIN EX PADS: 1.0000 "application " | MEDICATED_PAD | CUTANEOUS | Status: DC | PRN

## 2013-07-07 MED ORDER — IBUPROFEN 600 MG PO TABS
600.0000 mg | ORAL_TABLET | Freq: Four times a day (QID) | ORAL | Status: DC
Start: 2013-07-08 — End: 2013-07-10
  Administered 2013-07-08 – 2013-07-10 (×9): 600 mg via ORAL
  Filled 2013-07-07 (×9): qty 1

## 2013-07-07 MED ORDER — MEPERIDINE HCL 25 MG/ML IJ SOLN
INTRAMUSCULAR | Status: DC | PRN
  Administered 2013-07-07 (×2): 12.5 mg via INTRAVENOUS

## 2013-07-07 MED ORDER — ONDANSETRON HCL 4 MG PO TABS: 4.0000 mg | ORAL_TABLET | ORAL | Status: DC | PRN

## 2013-07-07 MED ORDER — MIDAZOLAM HCL 2 MG/2ML IJ SOLN: 0.5000 mg | Freq: Once | INTRAMUSCULAR | Status: DC | PRN

## 2013-07-07 MED ORDER — OXYCODONE-ACETAMINOPHEN 5-325 MG PO TABS
1.0000 | ORAL_TABLET | ORAL | Status: DC | PRN
Start: 2013-07-07 — End: 2013-07-10
  Administered 2013-07-08 – 2013-07-09 (×3): 1 via ORAL
  Administered 2013-07-09: 2 via ORAL
  Administered 2013-07-09 – 2013-07-10 (×4): 1 via ORAL
  Filled 2013-07-07: qty 1
  Filled 2013-07-07: qty 2
  Filled 2013-07-07 (×7): qty 1

## 2013-07-07 SURGICAL SUPPLY — 34 items
BENZOIN TINCTURE PRP APPL 2/3 (GAUZE/BANDAGES/DRESSINGS) ×2 IMPLANT
CLAMP CORD UMBIL (MISCELLANEOUS) IMPLANT
CLOSURE STERI STRIP 1/2 X4 (GAUZE/BANDAGES/DRESSINGS) ×2 IMPLANT
CLOTH BEACON ORANGE TIMEOUT ST (SAFETY) ×2 IMPLANT
DERMABOND ADVANCED (GAUZE/BANDAGES/DRESSINGS)
DERMABOND ADVANCED .7 DNX12 (GAUZE/BANDAGES/DRESSINGS) IMPLANT
DRAPE LG THREE QUARTER DISP (DRAPES) IMPLANT
DRSG OPSITE POSTOP 4X10 (GAUZE/BANDAGES/DRESSINGS) ×2 IMPLANT
DURAPREP 26ML APPLICATOR (WOUND CARE) ×2 IMPLANT
ELECT REM PT RETURN 9FT ADLT (ELECTROSURGICAL) ×2
ELECTRODE REM PT RTRN 9FT ADLT (ELECTROSURGICAL) ×1 IMPLANT
EXTRACTOR VACUUM KIWI (MISCELLANEOUS) IMPLANT
EXTRACTOR VACUUM M CUP 4 TUBE (SUCTIONS) IMPLANT
GLOVE BIO SURGEON STRL SZ 6 (GLOVE) ×2 IMPLANT
GLOVE BIOGEL PI IND STRL 6 (GLOVE) ×2 IMPLANT
GLOVE BIOGEL PI INDICATOR 6 (GLOVE) ×2
GOWN STRL REUS W/TWL LRG LVL3 (GOWN DISPOSABLE) ×4 IMPLANT
KIT ABG SYR 3ML LUER SLIP (SYRINGE) ×2 IMPLANT
NEEDLE HYPO 25X5/8 SAFETYGLIDE (NEEDLE) ×2 IMPLANT
NS IRRIG 1000ML POUR BTL (IV SOLUTION) ×2 IMPLANT
PACK C SECTION WH (CUSTOM PROCEDURE TRAY) ×2 IMPLANT
PAD OB MATERNITY 4.3X12.25 (PERSONAL CARE ITEMS) ×2 IMPLANT
STAPLER VISISTAT 35W (STAPLE) IMPLANT
SUT CHROMIC 0 CTX 36 (SUTURE) ×6 IMPLANT
SUT MON AB 2-0 CT1 27 (SUTURE) ×2 IMPLANT
SUT PDS AB 0 CT1 27 (SUTURE) IMPLANT
SUT PLAIN 0 NONE (SUTURE) ×2 IMPLANT
SUT VIC AB 0 CT1 27 (SUTURE) ×1
SUT VIC AB 0 CT1 27XBRD ANBCTR (SUTURE) ×1 IMPLANT
SUT VIC AB 0 CT1 36 (SUTURE) IMPLANT
SUT VIC AB 4-0 KS 27 (SUTURE) ×2 IMPLANT
TOWEL OR 17X24 6PK STRL BLUE (TOWEL DISPOSABLE) ×2 IMPLANT
TRAY FOLEY CATH 14FR (SET/KITS/TRAYS/PACK) IMPLANT
WATER STERILE IRR 1000ML POUR (IV SOLUTION) ×2 IMPLANT

## 2013-07-07 NOTE — Transfer of Care (Signed)
Immediate Anesthesia Transfer of Care Note  Patient: Janet Welch  Procedure(s) Performed: Procedure(s): CESAREAN SECTION (N/A)  Patient Location: PACU  Anesthesia Type:Epidural  Level of Consciousness: awake  Airway & Oxygen Therapy: Patient Spontanous Breathing  Post-op Assessment: Report given to PACU RN and Post -op Vital signs reviewed and stable  Post vital signs: stable  Complications: No apparent anesthesia complications

## 2013-07-07 NOTE — Op Note (Signed)
[facility] PROCEDURE DATE: 07/06/2013 - 07/07/2013  PREOPERATIVE DIAGNOSIS: Intrauterine pregnancy at  [redacted]w[redacted]d weeks gestation with arrest of descent and non-reassuring fetal heart tracing; desires permanent sterilization  POSTOPERATIVE DIAGNOSIS: The same  PROCEDURE:  Primary  Low Transverse Cesarean Section with Bilateral Tubal Ligation  SURGEON:  Dr. Linda Hedges  INDICATIONS: Janet Welch is a 38 y.o. [phone] at [redacted]w[redacted]d scheduled for cesarean section secondary to arrest of descent and non-reassuring fetal heart tracing.  The risks of cesarean section discussed with the patient included but were not limited to: bleeding which may require transfusion or reoperation; infection which may require antibiotics; injury to bowel, bladder, ureters or other surrounding organs; injury to the fetus; need for additional procedures including hysterectomy in the event of a life-threatening hemorrhage, incisional problems, thromboembolic phenomenon and other postoperative/anesthesia complications. Also, the patient was counseled about the risk of failure, regret and ectopic pregnancy with tubal ligation.  The patient concurred with the proposed plan, giving informed written consent for the procedure.    FINDINGS:  Viable female infant in cephalic presentation, APGARs 9,9: weight pending  clear amniotic fluid.  Intact placenta, three vessel cord.  Grossly normal uterus, ovaries and fallopian tubes. .   ANESTHESIA:    Epidural ESTIMATED BLOOD LOSS:  700 ml SPECIMENS: Placenta sent to L&D COMPLICATIONS: None immediate  PROCEDURE IN DETAIL:  The patient received intravenous antibiotics and had sequential compression devices applied to her lower extremities while in the preoperative area.  She was then taken to the operating room where epidural anesthesia was dosed up to surgical level and was found to be adequate. She was then placed in a dorsal supine position with a leftward tilt, and prepped and draped in a sterile  manner.  A foley catheter was placed into her bladder and attached to constant gravity.  After an adequate timeout was performed, a Pfannenstiel skin incision was made with scalpel and carried through to the underlying layer of fascia. The fascia was incised in the midline and this incision was extended bilaterally using the Mayo scissors. Kocher clamps were applied to the superior aspect of the fascial incision and the underlying rectus muscles were dissected off bluntly. A similar process was carried out on the inferior aspect of the facial incision. The rectus muscles were separated in the midline bluntly and the peritoneum was entered bluntly.  A bladder flap was created sharply and developed bluntly.  Bladder was protected behind the bladder blade.  A transverse hysterotomy was made with a scalpel and extended bilaterally bluntly. The bladder blade was then removed. The infant was successfully delivered, and cord was clamped and cut and infant was handed over to awaiting neonatology team. Uterine massage was then administered and the placenta delivered intact with three-vessel cord. The uterus was cleared of clot and debris.  The hysterotomy was closed with 0 Chromic.  A second imbricating suture of 0 Chromic was used to reinforce the incision and aid in hemostasis.  Attention was turned to the fallopian tubes.  The right tube was elevated, doubly tied with plain gut and the knuckle was excised with excellent hemostasis.  The contralateral fallopian tube was treated in the same way.  The peritoneum and rectus muscles were noted to be hemostatic and were reapproximated using 3-0 Monocryl.  The fascia was closed with 0-Vicryl in a running fashion with good restoration of anatomy.  The subcutaneus tissue was copiously irrigated.  The skin was closed with 4-0 Vicryl in a subcuticular fashion.  Pt tolerated the procedure will.  All counts were correct x2.  Pt went to the recovery room in stable condition.

## 2013-07-07 NOTE — Progress Notes (Signed)
Dr Jillyn Hidden notified that pt desating into upper 80s when sleeping.  O2 started @ 2 liters/min

## 2013-07-07 NOTE — Progress Notes (Signed)
Patient has been pushing effectively x 2 hours and little descent noted.  C/C/+2.  Late decelerations now present despite pitocin off and repositioning.  Recommend C/S secondary to arrest of descent.  Counseled re: risk of bleeding, infection, scarring, and damage to surrounding structures.  She is informed of the risk of life-saving C-hyst.  She also wants to have BTL since she is having C/S.  She understands the risk of failure, regret and permanence.  All questions were answered and the patient wishes to proceed.    Linda Hedges, DO

## 2013-07-07 NOTE — Progress Notes (Signed)
Rope and hand bars used

## 2013-07-07 NOTE — Consult Note (Signed)
Neonatology Note:   Attendance at C-section:    I was asked by Dr. Rogue Bussing to attend this primary C/S at term due to FTP. 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 ROM 8 hours before delivery, fluid clear. Mother received Pen G > 4 hours prior to delivery.  Infant vigorous with good spontaneous cry and tone. Needed only minimal bulb suctioning. Ap 9/9. Lungs clear to ausc in DR. To CN to care of Pediatrician.   Real Cons, MD

## 2013-07-07 NOTE — Addendum Note (Signed)
Addendum created 07/07/13 1758 by Ignacia Bayley, CRNA   Modules edited: Anesthesia Flowsheet

## 2013-07-07 NOTE — H&P (Signed)
Janet Welch is a 38 y.o. female presenting for latent labor.  Antepartum course complicated by AMA with normal first trimester screen.  Of note, patient has left pelvic kidney.  GBS positive.  Patient began contracting last night and came in for evaluation secondary to multiparity and h/o fast labor.  She is comfortable with epidural.  Maternal Medical History:  Reason for admission: Contractions.   Contractions: Onset was 13-24 hours ago.   Frequency: irregular.   Perceived severity is mild.    Fetal activity: Perceived fetal activity is normal.   Last perceived fetal movement was within the past hour.    Prenatal complications: no prenatal complications Prenatal Complications - Diabetes: none.    OB History   Grav Para Term Preterm Abortions TAB SAB Ect Mult Living   5 3 3  0 0 0 0 0 0 3     Past Medical History  Diagnosis Date  . No pertinent past medical history    Past Surgical History  Procedure Laterality Date  . Scar tissue biopsy    . Leg skin lesion  biopsy / excision     Family History: family history includes Parkinson's disease in her father; Thyroid disease in her mother. Social History:  reports that she has never smoked. She has never used smokeless tobacco. She reports that she does not drink alcohol or use illicit drugs.   Prenatal Transfer Tool  Maternal Diabetes: No Genetic Screening: Normal Maternal Ultrasounds/Referrals: Normal Fetal Ultrasounds or other Referrals:  None Maternal Substance Abuse:  No Significant Maternal Medications:  None Significant Maternal Lab Results:  Lab values include: Group B Strep positive Other Comments:  None  ROS  Dilation: 5 Effacement (%): 70 Station: Ballotable Exam by:: C McIntosh RN Blood pressure 113/64, pulse 66, temperature 98.1 F (36.7 C), temperature source Oral, resp. rate 18, height 5\' 7"  (1.702 m), weight 199 lb 6 oz (90.436 kg), last menstrual period 09/30/2012, SpO2 100.00%, unknown if currently  breastfeeding. Maternal Exam:  Uterine Assessment: Contraction strength is mild.  Contraction frequency is irregular.   Abdomen: Patient reports no abdominal tenderness. Fundal height is c/w dates.   Estimated fetal weight is 8#.       Physical Exam  Constitutional: She is oriented to person, place, and time. She appears well-developed and well-nourished.  GI: Soft. There is no rebound and no guarding.  Neurological: She is alert and oriented to person, place, and time.  Skin: Skin is warm and dry.  Psychiatric: She has a normal mood and affect. Her behavior is normal.    Prenatal labs: ABO, Rh: B/Positive/-- (11/14 0000) Antibody: Negative (11/14 0000) Rubella: Immune (11/14 0000) RPR: NON REAC (06/17 2310)  HBsAg: Negative (11/14 0000)  HIV: Non-reactive (11/14 0000)  GBS: Positive (05/27 0000)   Assessment/Plan: 38yo G5P3 at [redacted]w[redacted]d with latent labor -PCN for GBS ppx -Will AROM -Pitocin prn   MORRIS, MEGAN 07/07/2013, 8:10 AM

## 2013-07-07 NOTE — Anesthesia Preprocedure Evaluation (Signed)
Anesthesia Evaluation  Patient identified by MRN, date of birth, ID band Patient awake    Reviewed: Allergy & Precautions, H&P , NPO status , Patient's Chart, lab work & pertinent test results  History of Anesthesia Complications Negative for: history of anesthetic complications  Airway Mallampati: II TM Distance: >3 FB Neck ROM: full    Dental no notable dental hx. (+) Teeth Intact   Pulmonary neg pulmonary ROS,  breath sounds clear to auscultation  Pulmonary exam normal       Cardiovascular negative cardio ROS  Rhythm:regular Rate:Normal     Neuro/Psych negative neurological ROS  negative psych ROS   GI/Hepatic negative GI ROS, Neg liver ROS,   Endo/Other  negative endocrine ROS  Renal/GU negative Renal ROS  negative genitourinary   Musculoskeletal   Abdominal Normal abdominal exam  (+)   Peds  Hematology negative hematology ROS (+)   Anesthesia Other Findings   Reproductive/Obstetrics (+) Pregnancy                           Anesthesia Physical Anesthesia Plan  ASA: II  Anesthesia Plan: Epidural   Post-op Pain Management:    Induction:   Airway Management Planned:   Additional Equipment:   Intra-op Plan:   Post-operative Plan:   Informed Consent: I have reviewed the patients History and Physical, chart, labs and discussed the procedure including the risks, benefits and alternatives for the proposed anesthesia with the patient or authorized representative who has indicated his/her understanding and acceptance.     Plan Discussed with:   Anesthesia Plan Comments:         Anesthesia Quick Evaluation  

## 2013-07-07 NOTE — Progress Notes (Signed)
To OR per stretcher

## 2013-07-07 NOTE — Anesthesia Postprocedure Evaluation (Signed)
Anesthesia Post Note  Patient: Janet Welch  Procedure(s) Performed: Procedure(s) (LRB): CESAREAN SECTION (N/A)  Anesthesia type: Epidural  Patient location: PACU  Post pain: Pain level controlled  Post assessment: Post-op Vital signs reviewed  Last Vitals:  Filed Vitals:   07/07/13 1730  BP: 100/51  Pulse: 76  Temp:   Resp: 14    Post vital signs: Reviewed  Level of consciousness: awake  Complications: No apparent anesthesia complications

## 2013-07-07 NOTE — Anesthesia Postprocedure Evaluation (Deleted)
  Anesthesia Post-op Note  Patient: Janet Welch  Procedure(s) Performed: Procedure(s): CESAREAN SECTION (N/A)  Patient Location: PACU  Anesthesia Type:Epidural  Level of Consciousness: awake  Airway and Oxygen Therapy: Patient Spontanous Breathing  Post-op Pain: mild  Post-op Assessment: Patient's Cardiovascular Status Stable and Respiratory Function Stable  Post-op Vital Signs: stable  Last Vitals:  Filed Vitals:   07/07/13 1557  BP:   Pulse:   Temp:   Resp: 20    Complications: No apparent anesthesia complications

## 2013-07-08 ENCOUNTER — Encounter (HOSPITAL_COMMUNITY): Payer: Self-pay | Admitting: Obstetrics & Gynecology

## 2013-07-08 LAB — CBC
HCT: 29.3 % — ABNORMAL LOW (ref 36.0–46.0)
Hemoglobin: 9.5 g/dL — ABNORMAL LOW (ref 12.0–15.0)
MCH: 28.4 pg (ref 26.0–34.0)
MCHC: 32.4 g/dL (ref 30.0–36.0)
MCV: 87.7 fL (ref 78.0–100.0)
PLATELETS: 218 10*3/uL (ref 150–400)
RBC: 3.34 MIL/uL — ABNORMAL LOW (ref 3.87–5.11)
RDW: 14 % (ref 11.5–15.5)
WBC: 17.3 10*3/uL — ABNORMAL HIGH (ref 4.0–10.5)

## 2013-07-08 NOTE — Anesthesia Postprocedure Evaluation (Signed)
  Anesthesia Post-op Note  Patient: Janet Welch  Procedure(s) Performed: Procedure(s): CESAREAN SECTION (N/A)  Patient Location: Mother/Baby  Anesthesia Type:Epidural  Level of Consciousness: awake  Airway and Oxygen Therapy: Patient Spontanous Breathing  Post-op Pain: mild  Post-op Assessment: Patient's Cardiovascular Status Stable and Respiratory Function Stable  Post-op Vital Signs: stable  Last Vitals:  Filed Vitals:   07/08/13 0630  BP: 90/58  Pulse: 83  Temp:   Resp:     Complications: No apparent anesthesia complications

## 2013-07-08 NOTE — Addendum Note (Signed)
Addendum created 07/08/13 0741 by Ignacia Bayley, CRNA   Modules edited: Charges VN, Notes Section   Notes Section:  File: [id number]

## 2013-07-08 NOTE — Lactation Note (Signed)
This note was copied from the chart of [place]. Lactation Consultation Note  Patient Name: Janet Welch [id number] Date: 07/08/2013 Reason for consult: Initial assessment  Baby 19 hours of life.  Mom states baby just finished nursing, and nursing going well. Mom states that she has nursed each of her children for 15 months. Mom given [facility] brochure, aware of OP/BFSG and community resources. Enc to call for assist with latch as needed. Maternal Data Has patient been taught Hand Expression?: Yes Does the patient have breastfeeding experience prior to this delivery?: Yes  Feeding Feeding Type:  (Mom has just finished nursing.)  LATCH Score/Interventions                      Lactation Tools Discussed/Used     Consult Status Consult Status: Follow-up Date: 07/09/13 Follow-up type: In-patient    Inocente Salles 07/08/2013, 11:42 AM

## 2013-07-08 NOTE — Progress Notes (Signed)
Subjective: Postpartum Day 1: Cesarean Delivery Patient reports tolerating PO.    Objective: Vital signs in last 24 hours: Temp:  [97.6 F (36.4 C)-99.7 F (37.6 C)] 98.4 F (36.9 C) (06/19 0430) Pulse Rate:  [60-107] 83 (06/19 0630) Resp:  [13-20] 18 (06/19 0430) BP: (87-120)/(42-84) 90/58 mmHg (06/19 0630) SpO2:  [88 %-99 %] 95 % (06/19 0430)  Physical Exam:  General: alert and cooperative Lochia: appropriate Uterine Fundus: firm Incision: honeycomb dressing CDI DVT Evaluation: No evidence of DVT seen on physical exam. Negative Homan's sign. No cords or calf tenderness. No significant calf/ankle edema.   Recent Labs  07/06/13 2310 07/08/13 0600  HGB 12.4 9.5*  HCT 37.0 29.3*    Assessment/Plan: Status post Cesarean section. Doing well postoperatively.  Continue current care.  CURTIS,CAROL G 07/08/2013, 8:26 AM

## 2013-07-09 NOTE — Progress Notes (Signed)
Subjective: Postpartum Day 2: Cesarean Delivery Patient reports tolerating PO, + flatus and no problems voiding.    Objective: Vital signs in last 24 hours: Temp:  [97.8 F (36.6 C)-98.2 F (36.8 C)] 97.8 F (36.6 C) (06/20 0518) Pulse Rate:  [62-82] 71 (06/20 0518) Resp:  [18-20] 18 (06/20 0518) BP: (92-100)/(54-61) 99/59 mmHg (06/20 0518) SpO2:  [95 %-98 %] 98 % (06/19 1630)  Physical Exam:  General: alert, cooperative and no distress Lochia: appropriate Uterine Fundus: firm Incision: healing well DVT Evaluation: No evidence of DVT seen on physical exam.   Recent Labs  07/06/13 2310 07/08/13 0600  HGB 12.4 9.5*  HCT 37.0 29.3*    Assessment/Plan: Status post Cesarean section. Doing well postoperatively.  Continue current care.  TOMBLIN II,JAMES E 07/09/2013, 10:32 AM

## 2013-07-09 NOTE — Plan of Care (Signed)
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

## 2013-07-10 MED ORDER — IBUPROFEN 600 MG PO TABS: 600.0000 mg | ORAL_TABLET | Freq: Four times a day (QID) | ORAL | Status: DC | PRN

## 2013-07-10 MED ORDER — OXYCODONE-ACETAMINOPHEN 5-325 MG PO TABS: 1.0000 | ORAL_TABLET | Freq: Four times a day (QID) | ORAL | Status: DC | PRN

## 2013-07-10 NOTE — Progress Notes (Signed)
Subjective: Postpartum Day 3: Cesarean Delivery Patient reports tolerating PO, + flatus and no problems voiding.    Objective: Vital signs in last 24 hours: Temp:  [97.7 F (36.5 C)-98 F (36.7 C)] 97.7 F (36.5 C) (06/21 0500) Pulse Rate:  [64-71] 64 (06/21 0500) Resp:  [18] 18 (06/21 0500) BP: (103-105)/(50-68) 105/50 mmHg (06/21 0500)  Physical Exam:  General: alert, cooperative and no distress Lochia: appropriate Uterine Fundus: firm Incision: healing well DVT Evaluation: No evidence of DVT seen on physical exam.   Recent Labs  07/08/13 0600  HGB 9.5*  HCT 29.3*    Assessment/Plan: Status post Cesarean section. Doing well postoperatively.  Discharge home with standard precautions and return to clinic in 4-6 weeks.  TOMBLIN II,JAMES E 07/10/2013, 9:38 AM

## 2013-07-10 NOTE — Lactation Note (Signed)
This note was copied from the chart of [place]. Lactation Consultation Note  Patient Name: Janet Welch [id number] Date: 07/10/2013 Reason for consult: Follow-up assessment;Breast/nipple pain Mom asked for assistance with latch due to nipple pain. Left nipple is cracked. She is wearing comfort gels and reports discomfort is improving. Care for sore nipples reviewed.  Assisted Mom in football hold and obtaining more depth with latch. Mom has been using cradle hold Mom reported some pain with initial latch that resolved with baby nursing. Mom reports her breasts are filling. No engorgement noted. Engorgement care reviewed if needed. Advised of OP services and support group.   Maternal Data    Feeding Feeding Type: Breast Fed Length of feed: 15 min  LATCH Score/Interventions Latch: Grasps breast easily, tongue down, lips flanged, rhythmical sucking.  Audible Swallowing: A few with stimulation  Type of Nipple: Everted at rest and after stimulation  Comfort (Breast/Nipple): Filling, red/small blisters or bruises, mild/mod discomfort  Problem noted: Cracked, bleeding, blisters, bruises;Mild/Moderate discomfort Interventions  (Cracked/bleeding/bruising/blister): Expressed breast milk to nipple Interventions (Mild/moderate discomfort): Comfort gels  Hold (Positioning): Assistance needed to correctly position infant at breast and maintain latch. Intervention(s): Breastfeeding basics reviewed;Support Pillows;Position options;Skin to skin  LATCH Score: 7  Lactation Tools Discussed/Used Tools: Comfort gels   Consult Status Consult Status: Complete Date: 07/10/13    Katrine Coho 07/10/2013, 8:57 AM

## 2013-07-11 NOTE — Discharge Summary (Signed)
Obstetric Discharge Summary Reason for Admission: onset of labor Prenatal Procedures: ultrasound Intrapartum Procedures: cesarean: low cervical, transverse Postpartum Procedures: none Complications-Operative and Postpartum: none Hemoglobin  Date Value Ref Range Status  07/08/2013 9.5* 12.0 - 15.0 g/dL Final     DELTA CHECK NOTED     REPEATED TO VERIFY     HCT  Date Value Ref Range Status  07/08/2013 29.3* 36.0 - 46.0 % Final    Physical Exam:  General: alert, cooperative and no distress Lochia: appropriate Uterine Fundus: firm Incision: healing well DVT Evaluation: No evidence of DVT seen on physical exam.  Discharge Diagnoses: Term Pregnancy-delivered  Discharge Information: Date: 07/11/2013 Activity: pelvic rest Diet: routine Medications: PNV, Ibuprofen and Percocet Condition: stable Instructions: refer to practice specific booklet Discharge to: home   Newborn Data: Live born female  Birth Weight: 8 lb 13.6 oz (4014 g) APGAR: 9, 9  Home with mother.  TOMBLIN II,JAMES E 07/11/2013, 12:59 AM

## 2013-11-21 ENCOUNTER — Encounter (HOSPITAL_COMMUNITY): Payer: Self-pay | Admitting: Obstetrics & Gynecology

## 2014-01-30 ENCOUNTER — Encounter: Payer: Self-pay | Admitting: Physician Assistant

## 2014-07-06 ENCOUNTER — Encounter: Payer: Self-pay | Admitting: Physician Assistant

## 2014-07-06 ENCOUNTER — Ambulatory Visit (INDEPENDENT_AMBULATORY_CARE_PROVIDER_SITE_OTHER): Payer: BLUE CROSS/BLUE SHIELD | Admitting: Physician Assistant

## 2014-07-06 VITALS — BP 100/60 | HR 80 | Temp 98.3°F | Resp 18 | Wt 159.0 lb

## 2014-07-06 DIAGNOSIS — H6121 Impacted cerumen, right ear: Secondary | ICD-10-CM

## 2014-07-06 NOTE — Progress Notes (Signed)
    Patient ID: Janet Welch MRN: [id number], DOB: August 14, 1975, 39 y.o. Date of Encounter: 07/06/2014, 2:34 PM    Chief Complaint:  Chief Complaint  Patient presents with  . OTHER    right ear feels clogged up, feels like wax in ear and in tunnel     HPI: 39 y.o. year old white female presents with above. Says that she has had problems with wax getting in stuck in her right ear in the past. Says that her right ear seems to be made differently than the left and she has had problems with wax in the right ear in the past but never problems with the left. No other complaints or concerns.     Home Meds:   Outpatient Prescriptions Prior to Visit  Medication Sig Dispense Refill  . ibuprofen (ADVIL,MOTRIN) 600 MG tablet Take 1 tablet (600 mg total) by mouth every 6 (six) hours as needed. 30 tablet 0  . oxyCODONE-acetaminophen (PERCOCET/ROXICET) 5-325 MG per tablet Take 1-2 tablets by mouth every 6 (six) hours as needed for severe pain (moderate - severe pain). (Patient not taking: Reported on 07/06/2014) 30 tablet 0  . Prenatal Vit-Fe Fumarate-FA (PRENATAL MULTIVITAMIN) TABS Take 1 tablet by mouth daily.     No facility-administered medications prior to visit.    Allergies: No Known Allergies    Review of Systems: See HPI for pertinent ROS. All other ROS negative.    Physical Exam: Blood pressure 100/60, pulse 80, temperature 98.3 F (36.8 C), temperature source Oral, resp. rate 18, weight 159 lb (72.122 kg), not currently breastfeeding., Body mass index is 24.9 kg/(m^2). General: WNWD WF.  Appears in no acute distress. HEENT: Normocephalic, atraumatic, eyes without discharge, sclera non-icteric, nares are without discharge.Right ear canal obstructed. Left ear canal and tympanic membrane are all normal. There is absolutely no cerumen in the left ear canal. Right ear was irrigated. After irrigation right ear was reexamined. Ear canal is now patent and normal. Tympanic membrane normal. Oral  cavity moist, posterior pharynx without exudate, erythema, peritonsillar abscess, or post nasal drip.  Neck: Supple. No thyromegaly. No lymphadenopathy. Lungs: Clear bilaterally to auscultation without wheezes, rales, or rhonchi. Breathing is unlabored. Heart: Regular rhythm. No murmurs, rubs, or gallops. Msk:  Strength and tone normal for age. Extremities/Skin: Warm and dry. Neuro: Alert and oriented X 3. Moves all extremities spontaneously. Gait is normal. CNII-XII grossly in tact. Psych:  Responds to questions appropriately with a normal affect.     ASSESSMENT AND PLAN:  39 y.o. year old female with  1. Cerumen impaction, right Irrigated in the office today. All cerumen was removed. Told her that she can use over-the-counter drops to prevent reoccurrence. Follow up PRN.   Marin Olp [place], [place], [facility] 07/06/2014 2:34 PM

## 2014-08-23 ENCOUNTER — Other Ambulatory Visit: Payer: Self-pay | Admitting: Obstetrics and Gynecology

## 2014-08-24 LAB — CYTOLOGY - PAP

## 2015-04-18 ENCOUNTER — Telehealth: Payer: Self-pay | Admitting: *Deleted

## 2015-04-18 MED ORDER — OSELTAMIVIR PHOSPHATE 75 MG PO CAPS: 75.0000 mg | ORAL_CAPSULE | Freq: Every day | ORAL | Status: DC

## 2015-04-18 NOTE — Telephone Encounter (Signed)
Pt called stating daughter has flu and wants to know if can prescribe Tamiflu, was advised needs to come in to be seen. Please advise!  [id number] - [place], [place] - [address] AT [address]  Please call husband [employer]

## 2015-04-18 NOTE — Telephone Encounter (Signed)
Spoke to provider  Tamiflu 75 mg qd x 10 days.  Called pt and they want RX to go to [facility].  Rx called in

## 2015-07-05 DIAGNOSIS — D2262 Melanocytic nevi of left upper limb, including shoulder: Secondary | ICD-10-CM | POA: Diagnosis not present

## 2015-07-05 DIAGNOSIS — D2261 Melanocytic nevi of right upper limb, including shoulder: Secondary | ICD-10-CM | POA: Diagnosis not present

## 2015-07-05 DIAGNOSIS — D225 Melanocytic nevi of trunk: Secondary | ICD-10-CM | POA: Diagnosis not present

## 2015-07-05 DIAGNOSIS — D2372 Other benign neoplasm of skin of left lower limb, including hip: Secondary | ICD-10-CM | POA: Diagnosis not present

## 2015-07-05 DIAGNOSIS — D485 Neoplasm of uncertain behavior of skin: Secondary | ICD-10-CM | POA: Diagnosis not present

## 2015-10-18 DIAGNOSIS — Z6826 Body mass index (BMI) 26.0-26.9, adult: Secondary | ICD-10-CM | POA: Diagnosis not present

## 2015-10-18 DIAGNOSIS — Z01419 Encounter for gynecological examination (general) (routine) without abnormal findings: Secondary | ICD-10-CM | POA: Diagnosis not present

## 2015-10-25 DIAGNOSIS — L738 Other specified follicular disorders: Secondary | ICD-10-CM | POA: Diagnosis not present

## 2015-10-31 DIAGNOSIS — Z13228 Encounter for screening for other metabolic disorders: Secondary | ICD-10-CM | POA: Diagnosis not present

## 2015-10-31 DIAGNOSIS — Z13220 Encounter for screening for lipoid disorders: Secondary | ICD-10-CM | POA: Diagnosis not present

## 2015-10-31 DIAGNOSIS — Z1231 Encounter for screening mammogram for malignant neoplasm of breast: Secondary | ICD-10-CM | POA: Diagnosis not present

## 2015-11-05 ENCOUNTER — Other Ambulatory Visit: Payer: Self-pay | Admitting: Obstetrics and Gynecology

## 2015-11-05 DIAGNOSIS — R928 Other abnormal and inconclusive findings on diagnostic imaging of breast: Secondary | ICD-10-CM

## 2015-11-09 ENCOUNTER — Ambulatory Visit
Admission: RE | Admit: 2015-11-09 | Discharge: 2015-11-09 | Disposition: A | Discharge status: Home / Self Care | Payer: BLUE CROSS/BLUE SHIELD | Source: Ambulatory Visit | Attending: Obstetrics and Gynecology | Admitting: Obstetrics and Gynecology

## 2015-11-09 DIAGNOSIS — R928 Other abnormal and inconclusive findings on diagnostic imaging of breast: Secondary | ICD-10-CM

## 2015-11-09 DIAGNOSIS — R921 Mammographic calcification found on diagnostic imaging of breast: Secondary | ICD-10-CM | POA: Diagnosis not present

## 2016-02-08 ENCOUNTER — Telehealth: Payer: Self-pay | Admitting: Physician Assistant

## 2016-02-08 ENCOUNTER — Encounter: Payer: Self-pay | Admitting: Physician Assistant

## 2016-02-08 ENCOUNTER — Other Ambulatory Visit: Payer: Self-pay | Admitting: Family Medicine

## 2016-02-08 MED ORDER — OSELTAMIVIR PHOSPHATE 75 MG PO CAPS: 75.0000 mg | ORAL_CAPSULE | Freq: Every day | ORAL | 0 refills | Status: DC

## 2016-02-08 NOTE — Telephone Encounter (Signed)
Patient aware of med sent to pharm via vm

## 2016-02-08 NOTE — Telephone Encounter (Signed)
Ok Tamiflu 75 bid for 5 days

## 2016-02-08 NOTE — Telephone Encounter (Signed)
OK to call in - LOV 07/06/14

## 2016-02-08 NOTE — Telephone Encounter (Signed)
Pt states her husband was seen on 02/05/2016 for the flu and got tamiflu but now she has it. Wants to know if tamiflu can be called in for her.

## 2016-02-08 NOTE — Telephone Encounter (Signed)
This encounter was created in error - please disregard.

## 2016-02-08 NOTE — Telephone Encounter (Signed)
Pt states her husband was seen on Tuesday for the flu and now she has it wants to know if tamiflu can be called in for her.

## 2016-02-22 ENCOUNTER — Ambulatory Visit (INDEPENDENT_AMBULATORY_CARE_PROVIDER_SITE_OTHER): Payer: BLUE CROSS/BLUE SHIELD | Admitting: Family Medicine

## 2016-02-22 ENCOUNTER — Encounter: Payer: Self-pay | Admitting: Family Medicine

## 2016-02-22 VITALS — BP 102/60 | HR 80 | Temp 98.7°F | Resp 14 | Ht 67.0 in | Wt 167.0 lb

## 2016-02-22 DIAGNOSIS — H6691 Otitis media, unspecified, right ear: Secondary | ICD-10-CM | POA: Diagnosis not present

## 2016-02-22 MED ORDER — AMOXICILLIN 875 MG PO TABS: 875.0000 mg | ORAL_TABLET | Freq: Two times a day (BID) | ORAL | 0 refills | Status: DC

## 2016-02-22 NOTE — Patient Instructions (Signed)
Take antibiotics as prescribed F/U as needed 

## 2016-02-22 NOTE — Progress Notes (Signed)
   Subjective:    Patient ID: Janet Welch, female    DOB: 23-Apr-1975, 41 y.o.   MRN: [id number]  Patient presents for R Ear Pain (x1 week- has frequent ear infections ) Here with right ear pain for the past week. She has history of recurrent ear infections as had swimmer's ear as well as true inner ear infection. She does admit that her canal seen to be a little bit smaller and she is seeing ear nose and throat the past. She has not had knee fever this past week but the entire family had influenza which she completed Tamiflu  She has not noted any drainage from the ear. She does have muffled hearing    Review Of Systems: per above   GEN- denies fatigue, fever, weight loss,weakness, recent illness HEENT- denies eye drainage, change in vision, nasal discharge, CVS- denies chest pain, palpitations RESP- denies SOB, cough, wheeze ABD- denies N/V, change in stools, abd pain GU- denies dysuria, hematuria, dribbling, incontinence MSK- denies joint pain, muscle aches, injury Neuro- denies headache, dizziness, syncope, seizure activity       Objective:    BP 102/60 (BP Location: Left Arm, Patient Position: Sitting, Cuff Size: Normal)   Pulse 80   Temp 98.7 F (37.1 C) (Oral)   Resp 14   Ht 5\' 7"  (1.702 m)   Wt 167 lb (75.8 kg)   SpO2 98%   BMI 26.16 kg/m    GEN- NAD, alert and oriented x3 HEENT- PERRL, EOMI, non injected sclera, pink conjunctiva, MMM, oropharynx clear, TM clear left side, canal clear, TM erythema, fluid noted, bulging membrane, canal clear right side  No  maxillary sinus tenderness,nares clear  Neck- Supple, no LAD CVS- RRR, no murmur RESP-CTAB Pulses- Radial 2+         Assessment & Plan:      Problem List Items Addressed This Visit    None    Visit Diagnoses    Right otitis media, unspecified otitis media type    -  Primary   ROM, treat with amoxicllin, can use anti-histamine and Ibuprofen for pain    Relevant Medications   amoxicillin (AMOXIL) 875  MG tablet      Note: This dictation was prepared with Dragon dictation along with smaller phrase technology. Any transcriptional errors that result from this process are unintentional.

## 2016-03-06 ENCOUNTER — Encounter: Payer: Self-pay | Admitting: Family Medicine

## 2016-03-06 ENCOUNTER — Ambulatory Visit (INDEPENDENT_AMBULATORY_CARE_PROVIDER_SITE_OTHER): Payer: BLUE CROSS/BLUE SHIELD | Admitting: Family Medicine

## 2016-03-06 VITALS — BP 106/70 | HR 72 | Temp 99.1°F | Resp 16 | Ht 67.0 in | Wt 170.0 lb

## 2016-03-06 DIAGNOSIS — H6691 Otitis media, unspecified, right ear: Secondary | ICD-10-CM | POA: Diagnosis not present

## 2016-03-06 MED ORDER — NEOMYCIN-POLYMYXIN-HC 3.5-10000-1 OT SOLN: 3.0000 [drp] | Freq: Four times a day (QID) | OTIC | 0 refills | Status: DC

## 2016-03-06 MED ORDER — CEFDINIR 300 MG PO CAPS: 300.0000 mg | ORAL_CAPSULE | Freq: Two times a day (BID) | ORAL | 0 refills | Status: DC

## 2016-03-06 NOTE — Patient Instructions (Signed)
Try probiotic Take omnicef/ AND Ear drops F/U as needed

## 2016-03-06 NOTE — Progress Notes (Signed)
   Subjective:    Patient ID: Janet Welch, female    DOB: 08/29/1975, 41 y.o.   MRN: [id number]  Patient presents for R Ear Pain (states that infection has not improved from 2/2- has completed ABTx )    Pt here with persistant Right ear pain, seen on 2/2 had ROM given amoxicillin. History of recurrent ear infections and swimmers ea rin past. Treated with amoxicllin 875mg  for 10 days  She's not noticed any difference in her ear pain. She missed a couple of doses but only has 2 pills left. She still has muffled hearing and pain. She recalled that she's been on amoxicillin multiple times even as a child because of ear infections  Review Of Systems:  GEN- denies fatigue, fever, weight loss,weakness, recent illness HEENT- denies eye drainage, change in vision, nasal discharge, CVS- denies chest pain, palpitations RESP- denies SOB, cough, wheeze Neuro- denies headache, dizziness, syncope, seizure activity       Objective:    BP 106/70   Pulse 72   Temp 99.1 F (37.3 C) (Oral)   Resp 16   Ht 5\' 7"  (1.702 m)   Wt 170 lb (77.1 kg)   SpO2 98%   BMI 26.63 kg/m   GEN- NAD, alert and oriented x3 HEENT- PERRL, EOMI, non injected sclera, pink conjunctiva, MMM, oropharynx clear, TM clear left side, canal clear, TM erythema, fluid noted, bulging membrane, canal mild erythema No  maxillary sinus tenderness,nares clear  Neck- Supple, no LAD         Assessment & Plan:      Problem List Items Addressed This Visit    None    Visit Diagnoses    Right otitis media, unspecified otitis media type    -  Primary   Resistant to Amox, will D/C start omnicef, also given cortisporin, if no improvement in 1 week, needs urgent ENT evalauation   Relevant Medications   cefdinir (OMNICEF) 300 MG capsule      Note: This dictation was prepared with Dragon dictation along with smaller phrase technology. Any transcriptional errors that result from this process are unintentional.

## 2016-03-12 ENCOUNTER — Telehealth: Payer: Self-pay | Admitting: *Deleted

## 2016-03-12 DIAGNOSIS — H6691 Otitis media, unspecified, right ear: Secondary | ICD-10-CM

## 2016-03-12 NOTE — Telephone Encounter (Signed)
Received call from patient.   Reports that she has not seen much improvement in ear infection. Per chart notes, Resistant to Amox, will D/C start omnicef, also given cortisporin, if no improvement in 1 week, needs urgent ENT evaluation  Ok to refer?

## 2016-03-12 NOTE — Telephone Encounter (Signed)
Yes get urgent ENT referral

## 2016-03-13 ENCOUNTER — Encounter: Payer: Self-pay | Admitting: *Deleted

## 2016-03-13 NOTE — Telephone Encounter (Signed)
Referral placed.

## 2016-03-13 NOTE — Telephone Encounter (Signed)
This encounter was created in error - please disregard.

## 2016-03-13 NOTE — Telephone Encounter (Signed)
Received call from patient.   States that she has made her own appointment with [facility] with Dr. Redmond Baseman.   Please F/U

## 2016-03-13 NOTE — Telephone Encounter (Signed)
That's fine.  I did already fax over the notes.

## 2016-03-19 DIAGNOSIS — H6981 Other specified disorders of Eustachian tube, right ear: Secondary | ICD-10-CM | POA: Diagnosis not present

## 2016-03-19 DIAGNOSIS — H6501 Acute serous otitis media, right ear: Secondary | ICD-10-CM | POA: Diagnosis not present

## 2016-06-06 ENCOUNTER — Encounter: Payer: Self-pay | Admitting: Family Medicine

## 2016-06-06 ENCOUNTER — Ambulatory Visit (INDEPENDENT_AMBULATORY_CARE_PROVIDER_SITE_OTHER): Payer: BLUE CROSS/BLUE SHIELD | Admitting: Family Medicine

## 2016-06-06 VITALS — BP 118/58 | HR 60 | Temp 98.0°F | Resp 14 | Ht 67.0 in | Wt 169.0 lb

## 2016-06-06 DIAGNOSIS — J301 Allergic rhinitis due to pollen: Secondary | ICD-10-CM

## 2016-06-06 DIAGNOSIS — J040 Acute laryngitis: Secondary | ICD-10-CM

## 2016-06-06 DIAGNOSIS — R5383 Other fatigue: Secondary | ICD-10-CM | POA: Diagnosis not present

## 2016-06-06 LAB — CBC WITH DIFFERENTIAL/PLATELET
BASOS PCT: 1 %
Basophils Absolute: 64 cells/uL (ref 0–200)
Eosinophils Absolute: 192 cells/uL (ref 15–500)
Eosinophils Relative: 3 %
HEMATOCRIT: 39.7 % (ref 35.0–45.0)
HEMOGLOBIN: 12.4 g/dL (ref 12.0–15.0)
LYMPHS ABS: 1856 {cells}/uL (ref 850–3900)
Lymphocytes Relative: 29 %
MCH: 27.6 pg (ref 27.0–33.0)
MCHC: 31.2 g/dL — AB (ref 32.0–36.0)
MCV: 88.2 fL (ref 80.0–100.0)
MONO ABS: 384 {cells}/uL (ref 200–950)
MPV: 10.8 fL (ref 7.5–12.5)
Monocytes Relative: 6 %
NEUTROS ABS: 3904 {cells}/uL (ref 1500–7800)
NEUTROS PCT: 61 %
Platelets: 279 10*3/uL (ref 140–400)
RBC: 4.5 MIL/uL (ref 3.80–5.10)
RDW: 13.5 % (ref 11.0–15.0)
WBC: 6.4 10*3/uL (ref 3.8–10.8)

## 2016-06-06 LAB — COMPREHENSIVE METABOLIC PANEL
ALBUMIN: 4 g/dL (ref 3.6–5.1)
ALK PHOS: 41 U/L (ref 33–115)
ALT: 7 U/L (ref 6–29)
AST: 13 U/L (ref 10–30)
BILIRUBIN TOTAL: 0.5 mg/dL (ref 0.2–1.2)
BUN: 11 mg/dL (ref 7–25)
CO2: 25 mmol/L (ref 20–31)
CREATININE: 0.91 mg/dL (ref 0.50–1.10)
Calcium: 8.9 mg/dL (ref 8.6–10.2)
Chloride: 104 mmol/L (ref 98–110)
GLUCOSE: 82 mg/dL (ref 70–99)
Potassium: 3.8 mmol/L (ref 3.5–5.3)
Sodium: 139 mmol/L (ref 135–146)
TOTAL PROTEIN: 6.4 g/dL (ref 6.1–8.1)

## 2016-06-06 MED ORDER — METHYLPREDNISOLONE ACETATE 40 MG/ML IJ SUSP
40.0000 mg | Freq: Once | INTRAMUSCULAR | Status: AC
  Administered 2016-06-06: 40 mg via INTRAMUSCULAR

## 2016-06-06 NOTE — Progress Notes (Signed)
   Subjective:    Patient ID: Janet Welch, female    DOB: [birth year], 41 y.o.   MRN: [id number]  Patient presents for Seasonal Allergies (x2 months- allergy Sx with hoarseness) and Itching (x3 days- intense itching to B hands and feet- no rash or irritation noted)   Pt here with itching to hands and feet for past 3 days, no rash noted. Has also had Runny nose postnasal drip UTI, itchy eyes sneezing. She's also had hoarse voice on and off . She has Zyrtec but does not take regularly. She feels like she has been in a daze the past few weeks. She has no energy. No difficulty with sleep no fever no signs of sinusitis. Denies any ear pain. He says she has been itching in her palms and soles intermittently has not had any rash that she is noted. Denies any change in soap or detergents. but it was all over when she was pregnant it resolved after she delivered she does not have a rash at that time.   Review Of Systems:  GEN- + fatigue, denies fever, weight loss,weakness, recent illness HEENT- denies eye drainage, change in vision, +nasal discharge, CVS- denies chest pain, palpitations RESP- denies SOB, cough, wheeze ABD- denies N/V, change in stools, abd pain GU- denies dysuria, hematuria, dribbling, incontinence MSK- denies joint pain, muscle aches, injury Neuro- denies headache, dizziness, syncope, seizure activity       Objective:    BP (!) 118/58   Pulse 60   Temp 98 F (36.7 C) (Oral)   Resp 14   Ht 5\' 7"  (1.702 m)   Wt 169 lb (76.7 kg)   SpO2 98%   BMI 26.47 kg/m  GEN- NAD, alert and oriented x3,hoarse voice  HEENT- PERRL, EOMI, non injected sclera, pink conjunctiva, MMM, oropharynx phlegm seen in post oropharynx, TM clear no effusion, nares enlarged turbinates, +nasal drainage  Neck- Supple, no thyromegaly, no LAD  CVS- RRR, no murmur RESP-CTAB ABD-NABS,soft,NT,ND, no HSM Skin- in tact no lesions soles or palms EXT- No edema Pulses- Radial, DP- 2+        Assessment &  Plan:      Problem List Items Addressed This Visit    None    Visit Diagnoses    Other fatigue    -  Primary   Check labs at request no CBC/TSH in a few years on file.    Relevant Orders   CBC with Differential/Platelet   Comprehensive metabolic panel   TSH   Allergic rhinitis due to pollen, unspecified seasonality       Recommend she take the anti-hsitamine daily, also can use nasal steroid. With laryngitis, will give Depo Medrol injection also with interimmant pruritis in hands/feet. No sign of liver disease, normal skin exam She also has morbid itch cycle if she scratches at it and it is more she does not think about it and she does not have the itching in her hands. I think her bodies is just overreactive with the histamines at this time.    Relevant Medications   methylPREDNISolone acetate (DEPO-MEDROL) injection 40 mg (Completed)   Laryngitis          Note: This dictation was prepared with Dragon dictation along with smaller phrase technology. Any transcriptional errors that result from this process are unintentional.

## 2016-06-06 NOTE — Patient Instructions (Signed)
Take zyrtec daily  Use nasal steroid or try nasal saline We will call with lab results F/U as needed

## 2016-06-07 LAB — TSH: TSH: 1.31 mIU/L

## 2016-07-08 DIAGNOSIS — D2261 Melanocytic nevi of right upper limb, including shoulder: Secondary | ICD-10-CM | POA: Diagnosis not present

## 2016-07-08 DIAGNOSIS — D2262 Melanocytic nevi of left upper limb, including shoulder: Secondary | ICD-10-CM | POA: Diagnosis not present

## 2016-07-08 DIAGNOSIS — D2272 Melanocytic nevi of left lower limb, including hip: Secondary | ICD-10-CM | POA: Diagnosis not present

## 2016-07-08 DIAGNOSIS — D225 Melanocytic nevi of trunk: Secondary | ICD-10-CM | POA: Diagnosis not present

## 2016-12-05 ENCOUNTER — Other Ambulatory Visit: Payer: Self-pay | Admitting: Obstetrics and Gynecology

## 2016-12-05 DIAGNOSIS — Z139 Encounter for screening, unspecified: Secondary | ICD-10-CM

## 2016-12-22 DIAGNOSIS — Z01419 Encounter for gynecological examination (general) (routine) without abnormal findings: Secondary | ICD-10-CM | POA: Diagnosis not present

## 2016-12-22 DIAGNOSIS — Z6827 Body mass index (BMI) 27.0-27.9, adult: Secondary | ICD-10-CM | POA: Diagnosis not present

## 2017-01-07 ENCOUNTER — Ambulatory Visit: Payer: BLUE CROSS/BLUE SHIELD

## 2017-01-16 ENCOUNTER — Ambulatory Visit
Admission: RE | Admit: 2017-01-16 | Discharge: 2017-01-16 | Disposition: A | Discharge status: Home / Self Care | Payer: BLUE CROSS/BLUE SHIELD | Source: Ambulatory Visit | Attending: Obstetrics and Gynecology | Admitting: Obstetrics and Gynecology

## 2017-01-16 DIAGNOSIS — Z139 Encounter for screening, unspecified: Secondary | ICD-10-CM

## 2017-01-19 ENCOUNTER — Other Ambulatory Visit: Payer: Self-pay

## 2017-01-19 ENCOUNTER — Encounter: Payer: Self-pay | Admitting: Physician Assistant

## 2017-01-19 ENCOUNTER — Other Ambulatory Visit: Payer: Self-pay | Admitting: Obstetrics and Gynecology

## 2017-01-19 ENCOUNTER — Ambulatory Visit: Payer: BLUE CROSS/BLUE SHIELD | Admitting: Physician Assistant

## 2017-01-19 VITALS — BP 100/62 | HR 62 | Temp 98.1°F | Resp 16 | Wt 176.2 lb

## 2017-01-19 DIAGNOSIS — R921 Mammographic calcification found on diagnostic imaging of breast: Secondary | ICD-10-CM

## 2017-01-19 DIAGNOSIS — H6504 Acute serous otitis media, recurrent, right ear: Secondary | ICD-10-CM

## 2017-01-19 MED ORDER — AMOXICILLIN-POT CLAVULANATE 875-125 MG PO TABS: 1.0000 | ORAL_TABLET | Freq: Two times a day (BID) | ORAL | 0 refills | Status: AC

## 2017-01-19 NOTE — Progress Notes (Signed)
    Patient ID: Janet Welch MRN: [id number], DOB: [birth year], 41 y.o. Date of Encounter: 01/19/2017, 4:26 PM    Chief Complaint:  Chief Complaint  Patient presents with  . RIGHT EAR PAIN    X 1 month     HPI: 41 y.o. year old female presents with above.   Patient reports that her right ear has been hurting off and on for several weeks.  States that it was during the snowstorm--during that time she remembers that she leaned over and that is when she started feeling the discomfort in her right ear.  Says that it was an annoying full feeling but since then it has been hurting more.  Some days she does not feel it very much but in general it is progressively worsening. She reports that she has not had any nasal congestion or mucus from the nose.  Has had no sore throat.  No cough.  No fevers or chills.  Reviewed her chart and saw that 03/19/16 she had visit with Dr. Redmond Baseman that [facility].  Prior to her visit there she had been treated here with antibiotic. Patient reports that they discussed that tubes would be an option if symptoms did not resolve.  However patient states that eventually her symptoms did resolve and she has had no problems with this until now.     Home Meds:   Outpatient Medications Prior to Visit  Medication Sig Dispense Refill  . cetirizine (ZYRTEC ALLERGY) 10 MG tablet Take 10 mg by mouth daily as needed for allergies.    . diphenhydrAMINE (BENADRYL) 25 MG tablet Take 25 mg by mouth every 6 (six) hours as needed.     No facility-administered medications prior to visit.     Allergies: No Known Allergies    Review of Systems: See HPI for pertinent ROS. All other ROS negative.    Physical Exam: Blood pressure 100/62, pulse 62, temperature 98.1 F (36.7 C), temperature source Oral, resp. rate 16, weight 79.9 kg (176 lb 3.2 oz), last menstrual period 12/30/2016, SpO2 99 %., Body mass index is 27.6 kg/m. General:  WNWD WF. Appears in no acute  distress. HEENT: Normocephalic, atraumatic, eyes without discharge, sclera non-icteric, nares are without discharge. Bilateral auditory canals clear. Left TM appears normal.  Right TM: Lateral portion is golden with effusion.   Oral cavity moist, posterior pharynx without exudate, erythema, peritonsillar abscess. Neck: Supple. No thyromegaly. No lymphadenopathy. Lungs: Clear bilaterally to auscultation without wheezes, rales, or rhonchi. Breathing is unlabored. Heart: Regular rhythm. No murmurs, rubs, or gallops. Msk:  Strength and tone normal for age. Extremities/Skin: Warm and dry.  Neuro: Alert and oriented X 3. Moves all extremities spontaneously. Gait is normal. CNII-XII grossly in tact. Psych:  Responds to questions appropriately with a normal affect.     ASSESSMENT AND PLAN:  41 y.o. year old female with  1. Acute serous otitis media, recurrent, right ear She is to take antibiotic as directed.  She is to perform auto insufflation.  If symptoms do not resolve upon completion of antibiotic she can follow-up here or can call [facility] and schedule follow-up with Dr. Redmond Baseman. - amoxicillin-clavulanate (AUGMENTIN) 875-125 MG tablet; Take 1 tablet by mouth every 12 (twelve) hours for 7 days.  Dispense: 14 tablet; Refill: 0   Signed, [address] [place], [place], [facility] 01/19/2017 4:26 PM

## 2017-01-28 ENCOUNTER — Ambulatory Visit
Admission: RE | Admit: 2017-01-28 | Discharge: 2017-01-28 | Disposition: A | Discharge status: Home / Self Care | Payer: BLUE CROSS/BLUE SHIELD | Source: Ambulatory Visit | Attending: Obstetrics and Gynecology | Admitting: Obstetrics and Gynecology

## 2017-01-28 DIAGNOSIS — R921 Mammographic calcification found on diagnostic imaging of breast: Secondary | ICD-10-CM

## 2017-02-24 DIAGNOSIS — H6501 Acute serous otitis media, right ear: Secondary | ICD-10-CM | POA: Diagnosis not present

## 2017-02-24 DIAGNOSIS — H6981 Other specified disorders of Eustachian tube, right ear: Secondary | ICD-10-CM | POA: Diagnosis not present

## 2017-03-04 DIAGNOSIS — H6991 Unspecified Eustachian tube disorder, right ear: Secondary | ICD-10-CM | POA: Diagnosis not present

## 2017-03-25 ENCOUNTER — Encounter: Payer: Self-pay | Admitting: Physician Assistant

## 2017-03-25 ENCOUNTER — Other Ambulatory Visit: Payer: Self-pay

## 2017-03-25 ENCOUNTER — Ambulatory Visit: Payer: BLUE CROSS/BLUE SHIELD | Admitting: Physician Assistant

## 2017-03-25 VITALS — BP 126/82 | HR 83 | Temp 98.6°F | Resp 16 | Wt 162.0 lb

## 2017-03-25 DIAGNOSIS — G4700 Insomnia, unspecified: Secondary | ICD-10-CM

## 2017-03-25 MED ORDER — ZOLPIDEM TARTRATE 10 MG PO TABS: 10.0000 mg | ORAL_TABLET | Freq: Every evening | ORAL | 0 refills | Status: DC | PRN

## 2017-03-25 NOTE — Progress Notes (Signed)
Patient ID: Janet Welch MRN: [id number], DOB: 1975/12/13, 42 y.o. Date of Encounter: 03/25/2017, 10:51 AM    Chief Complaint:  Chief Complaint  Patient presents with  . trouble sleeping at night    going on 4 weeks      HPI: 42 y.o. year old female presents with above.   Her husband accompanies her for visit today.  Patient herself reports the following information: She is lying on the exam table on her back through the visit.  They have the lights turned off in the exam room.  Patient simply says that she just feels tired. Noted that the nurse had reported insomnia and that they have 4 children. Asked if she just feels tired because she does not have enough time to get enough sleep or if she feels like she gets in the bed to sleep but then cannot sleep. Also asked how long this is been going on if it is something that just acutely started to occur recently or if this is been a long-term issue. She never provides much information.   Husband then reports the following information: Says that she cannot fall asleep.  Says that he had back surgery about 4-1/2 weeks ago and says that this problem started about 4 weeks ago.  Says that she "has had to help him -- up until a week ago" as well as taking care of the 4 kids etc.  Says that he could not drive for 2 weeks and that he could not run his company [employer]) until just recently.  Asked if she was having to actually do any work regarding [employer] or if he is bringing that part up just because of decreased financial income etc. and he saysl "no, money is not a problem." Again mentions them taking care of 4 kids. Then says that she has been his wife for 19 years and that she has just recently told him things that she has never told him.  Says that "she has been his wife for 19 years and all the sudden it is like he does not even know who she is."  Says that "for the last 4 or 5 days, it is like she is not there".   Says that "she is asking weird questions and saying things that do not make sense."  "Not the wife I have known for 19 years ".  Notes that her mother has history of similar problems (mental health/ psychiatric issues) that have been intermittent since she was in her 65s.  Husband says that he thinks that wife is "having a nervous breakdown ".  Says that the things that she has said in the last few days about things that she went through during childhood are things that he has never heard before and her parents have never heard before.  Says that he has called psychologist to Dr. Enis Gash but is waiting to hear back from him and get appointment scheduled.  Husband states that he has been seeing Dr. Enis Gash himself for 4 years and that he feels that she needs to talk to a psychologist and that if they feel that she needs a psychiatrist then will do that as well.  She says that she does not feel suicidal or homicidal and husband verifies that he has not seen or heard any indication of suicidal or homicidal ideation.  Says that he just needs something so that his wife will sleep says that if she could just sleep  but that that would help until sees Dr. Enis Gash and that he will make sure that she sees Dr. Enis Gash this week.     Home Meds:   Outpatient Medications Prior to Visit  Medication Sig Dispense Refill  . diphenhydrAMINE (BENADRYL) 25 MG tablet Take 25 mg by mouth every 6 (six) hours as needed.    . cetirizine (ZYRTEC ALLERGY) 10 MG tablet Take 10 mg by mouth daily as needed for allergies.     No facility-administered medications prior to visit.     Allergies: No Known Allergies    Review of Systems: See HPI for pertinent ROS. All other ROS negative.    Physical Exam: Blood pressure 126/82, pulse 83, temperature 98.6 F (37 C), temperature source Oral, resp. rate 16, weight 73.5 kg (162 lb), last menstrual period 02/25/2017, SpO2 98 %., Body mass index is 25.37 kg/m. General: WNWD WF.   Appears in no acute distress. Neck: Supple. No thyromegaly. No lymphadenopathy. Lungs: Clear bilaterally to auscultation without wheezes, rales, or rhonchi. Breathing is unlabored. Heart: Regular rhythm. No murmurs, rubs, or gallops. Msk:  Strength and tone normal for age. Extremities/Skin: Warm and dry.  Neuro: Alert and oriented X 3. Moves all extremities spontaneously. Gait is normal. CNII-XII grossly in tact. Psych:  Responds to questions appropriately with a normal affect.     ASSESSMENT AND PLAN:  42 y.o. year old female with  1. Insomnia, unspecified type Ideally, she probably needs to be on some type of antipsychotic or mood stabilizer, or other type of medication.  Really she needs to have thorough evaluation with psychology and psychiatry.  Husband is definitely following up with this.  In the interim will give trial of Ambien to see if she can use this to be getting some sleep and husband is definitely scheduling follow-up with psychologist.  She is to go ahead and take the first dose of Ambien when they get home so that husband is awake and alert to monitor her with this medication.  If symptoms worsen, follow-up immediately. - zolpidem (AMBIEN) 10 MG tablet; Take 1 tablet (10 mg total) by mouth at bedtime as needed for sleep.  Dispense: 30 tablet; Refill: 0   Signed, [address] [place], [place], [facility] 03/25/2017 10:51 AM

## 2017-03-26 ENCOUNTER — Emergency Department (HOSPITAL_COMMUNITY)
Admission: EM | Admit: 2017-03-26 | Discharge: 2017-03-27 | Disposition: A | Discharge status: Home / Self Care | Payer: BLUE CROSS/BLUE SHIELD | Attending: Emergency Medicine | Admitting: Emergency Medicine

## 2017-03-26 ENCOUNTER — Ambulatory Visit: Payer: BLUE CROSS/BLUE SHIELD | Admitting: Physician Assistant

## 2017-03-26 ENCOUNTER — Other Ambulatory Visit: Payer: Self-pay

## 2017-03-26 ENCOUNTER — Inpatient Hospital Stay (HOSPITAL_COMMUNITY): Admit: 2017-03-26 | Payer: Self-pay | Admitting: Psychiatry

## 2017-03-26 ENCOUNTER — Encounter (HOSPITAL_COMMUNITY): Payer: Self-pay

## 2017-03-26 DIAGNOSIS — Z72820 Sleep deprivation: Secondary | ICD-10-CM | POA: Diagnosis not present

## 2017-03-26 DIAGNOSIS — F22 Delusional disorders: Secondary | ICD-10-CM | POA: Insufficient documentation

## 2017-03-26 DIAGNOSIS — F4489 Other dissociative and conversion disorders: Secondary | ICD-10-CM | POA: Diagnosis not present

## 2017-03-26 LAB — RAPID URINE DRUG SCREEN, HOSP PERFORMED
AMPHETAMINES: NOT DETECTED
BARBITURATES: NOT DETECTED
Benzodiazepines: NOT DETECTED
Cocaine: NOT DETECTED
Opiates: NOT DETECTED
TETRAHYDROCANNABINOL: NOT DETECTED

## 2017-03-26 LAB — URINALYSIS, ROUTINE W REFLEX MICROSCOPIC
BILIRUBIN URINE: NEGATIVE
GLUCOSE, UA: NEGATIVE mg/dL
Ketones, ur: 5 mg/dL — AB
NITRITE: NEGATIVE
Protein, ur: NEGATIVE mg/dL
SPECIFIC GRAVITY, URINE: 1.01 (ref 1.005–1.030)
pH: 6 (ref 5.0–8.0)

## 2017-03-26 LAB — COMPREHENSIVE METABOLIC PANEL
ALBUMIN: 4.2 g/dL (ref 3.5–5.0)
ALT: 13 U/L — AB (ref 14–54)
AST: 18 U/L (ref 15–41)
Alkaline Phosphatase: 44 U/L (ref 38–126)
Anion gap: 12 (ref 5–15)
BUN: 10 mg/dL (ref 6–20)
CHLORIDE: 103 mmol/L (ref 101–111)
CO2: 23 mmol/L (ref 22–32)
CREATININE: 0.8 mg/dL (ref 0.44–1.00)
Calcium: 9.2 mg/dL (ref 8.9–10.3)
GFR calc Af Amer: >60 mL/min (ref >60)
GFR calc non Af Amer: >60 mL/min (ref >60)
Glucose, Bld: 91 mg/dL (ref 65–99)
Potassium: 3.5 mmol/L (ref 3.5–5.1)
SODIUM: 138 mmol/L (ref 135–145)
Total Bilirubin: 1 mg/dL (ref 0.3–1.2)
Total Protein: 7.1 g/dL (ref 6.5–8.1)

## 2017-03-26 LAB — CBC
HCT: 43.3 % (ref 36.0–46.0)
Hemoglobin: 14.1 g/dL (ref 12.0–15.0)
MCH: 28.8 pg (ref 26.0–34.0)
MCHC: 32.6 g/dL (ref 30.0–36.0)
MCV: 88.4 fL (ref 78.0–100.0)
PLATELETS: 253 10*3/uL (ref 150–400)
RBC: 4.9 MIL/uL (ref 3.87–5.11)
RDW: 13.7 % (ref 11.5–15.5)
WBC: 7.2 10*3/uL (ref 4.0–10.5)

## 2017-03-26 LAB — ACETAMINOPHEN LEVEL

## 2017-03-26 LAB — TSH: TSH: 1.074 u[IU]/mL (ref 0.350–4.500)

## 2017-03-26 LAB — I-STAT BETA HCG BLOOD, ED (MC, WL, AP ONLY)

## 2017-03-26 LAB — I-STAT TROPONIN, ED: Troponin i, poc: 0 ng/mL (ref 0.00–0.08)

## 2017-03-26 LAB — CBG MONITORING, ED: Glucose-Capillary: 83 mg/dL (ref 65–99)

## 2017-03-26 LAB — ETHANOL

## 2017-03-26 LAB — SALICYLATE LEVEL

## 2017-03-26 MED ORDER — GUAIFENESIN-CODEINE 100-10 MG/5ML PO SOLN: 5.0000 mL | Freq: Once | ORAL | Status: DC

## 2017-03-26 MED ORDER — LORAZEPAM 1 MG PO TABS
1.0000 mg | ORAL_TABLET | Freq: Once | ORAL | Status: AC
  Administered 2017-03-26: 1 mg via ORAL
  Filled 2017-03-26: qty 1

## 2017-03-26 MED ORDER — IPRATROPIUM-ALBUTEROL 0.5-2.5 (3) MG/3ML IN SOLN: 3.0000 mL | Freq: Once | RESPIRATORY_TRACT | Status: DC

## 2017-03-26 MED ORDER — ACETAMINOPHEN 500 MG PO TABS: 1000.0000 mg | ORAL_TABLET | Freq: Once | ORAL | Status: DC

## 2017-03-26 NOTE — ED Notes (Signed)
Pt noted to [place], stating "it doesn't have my name on it" when looking at the label. RN explained that medication comes from bulk supply and they are not labeled for each pt. Pt took medication without further questions.

## 2017-03-26 NOTE — ED Triage Notes (Signed)
Pt brought in by her brother due to patient having insomnia, lack of sleep and abnormal behavior. Her brother states she was prescribed ambien yesterday and she took the meds and slept better last night. Family states they have noticed increased confusion. Hx of similar situation and having to be admitted to the hospital.
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
## 2017-03-26 NOTE — BHH Counselor (Signed)
[name], NP recommends inpatient treatment. Per [name], patient accepted to [name] for admission 03/27/2017 after 10am.. The accepting provider is Dr. Jonelle Sports. Nursing report [phone]. Patient will need to present to the [name] building upon arrival. Updated patient's nurse [name]) with patient's disposition.

## 2017-03-26 NOTE — ED Notes (Signed)
Pt CBG was 83, notified Brooke(RN)

## 2017-03-26 NOTE — BH Assessment (Signed)
Patient assigned a bed at [facility]. Room 403-1. Admission to [facility] cancelled.

## 2017-03-26 NOTE — Discharge Instructions (Addendum)
Follow-up with community mental health resources °
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
## 2017-03-27 MED ORDER — ACETAMINOPHEN 325 MG PO TABS: 650.0000 mg | ORAL_TABLET | ORAL | Status: DC | PRN

## 2017-03-27 MED ORDER — ZOLPIDEM TARTRATE 5 MG PO TABS: 10.0000 mg | ORAL_TABLET | Freq: Every evening | ORAL | Status: DC | PRN

## 2017-03-27 NOTE — ED Notes (Signed)
Patient talking to husband on the phone.

## 2017-03-27 NOTE — ED Notes (Signed)
Pt verbalized understanding of discharge instructions. Pt called family to bring her clothes.

## 2017-03-27 NOTE — ED Notes (Signed)
Pt still not wanting to go to [facility]. Pt wanting to follow up as out patient. [facility] agreeable with this plan. Dr Lacinda Axon agreeable as well.

## 2017-03-27 NOTE — ED Notes (Signed)
Patient stated she was ready to go, So I went to supply room to get some blue scrubs for patient and she asked for shoes and Blanket, I told her We don't have shoes.Then she ask me do buses go outside [place], I told her that the [facility] bus does. So She ask could you stay another night so I told her I will let the Nurse Know what she want.

## 2017-03-27 NOTE — ED Notes (Signed)
Pt attempted to reach family to pick her up. Pt family has her belongings. Pt at bedside eating lunch tray.

## 2017-03-27 NOTE — ED Notes (Signed)
Patient calling husband, reports she would like to speak with husband before signed consent for transfer for [facility].

## 2017-03-27 NOTE — ED Notes (Signed)
Pt states she has been unsuccessful getting in touvh with family for a ride home. Pt informed she is able to use the phone in the lobby to continue to attempt to reach family/friends. Pt agreeable to this plan. Pt cahnged into blue scrubs and shown the phone out in the lobby.

## 2017-03-27 NOTE — Progress Notes (Signed)
CSW contacted by [facility] ED.  Patient has been accepted by [facility] but patient refused to sign consent for treatment.  Patient chart was reviewed by this Probation officer, the [facility] Physician Extender, Rodman Key, NP and the Admission Coordinator at [facility], Letitia Libra, [place].  The treatment team does not think pt meets criteria for IVC.  We recommend that the EDP review patient status and discharge the patient if she is not willing to sign the consent for treatment.  Areatha Keas. Judi Cong, MSW, [place] Disposition Clinical Social Work [phone] (cell) [phone] (office)

## 2017-03-27 NOTE — Progress Notes (Signed)
CSW contacted pt's husband, [name], and informed him that pt would like to speak with him before signing [facility] consent for treatment form. Husband will call [facility] to speak with ED RN and pt.

## 2017-03-30 ENCOUNTER — Inpatient Hospital Stay (HOSPITAL_COMMUNITY)
Admission: RE | Admit: 2017-03-30 | Discharge: 2017-04-03 | DRG: 885 | Disposition: A | Discharge status: Home / Self Care | Payer: BLUE CROSS/BLUE SHIELD | Attending: Psychiatry | Admitting: Psychiatry

## 2017-03-30 ENCOUNTER — Ambulatory Visit: Payer: BLUE CROSS/BLUE SHIELD | Admitting: Physician Assistant

## 2017-03-30 ENCOUNTER — Encounter (HOSPITAL_COMMUNITY): Payer: Self-pay

## 2017-03-30 ENCOUNTER — Telehealth: Payer: Self-pay

## 2017-03-30 ENCOUNTER — Other Ambulatory Visit: Payer: Self-pay

## 2017-03-30 DIAGNOSIS — Z62810 Personal history of physical and sexual abuse in childhood: Secondary | ICD-10-CM | POA: Diagnosis not present

## 2017-03-30 DIAGNOSIS — F22 Delusional disorders: Secondary | ICD-10-CM | POA: Diagnosis not present

## 2017-03-30 DIAGNOSIS — F332 Major depressive disorder, recurrent severe without psychotic features: Principal | ICD-10-CM

## 2017-03-30 DIAGNOSIS — F312 Bipolar disorder, current episode manic severe with psychotic features: Secondary | ICD-10-CM | POA: Diagnosis present

## 2017-03-30 DIAGNOSIS — G4700 Insomnia, unspecified: Secondary | ICD-10-CM | POA: Diagnosis present

## 2017-03-30 DIAGNOSIS — F419 Anxiety disorder, unspecified: Secondary | ICD-10-CM | POA: Diagnosis not present

## 2017-03-30 DIAGNOSIS — F322 Major depressive disorder, single episode, severe without psychotic features: Secondary | ICD-10-CM

## 2017-03-30 DIAGNOSIS — Z6379 Other stressful life events affecting family and household: Secondary | ICD-10-CM | POA: Diagnosis not present

## 2017-03-30 DIAGNOSIS — F319 Bipolar disorder, unspecified: Secondary | ICD-10-CM | POA: Diagnosis not present

## 2017-03-30 MED ORDER — ALUM & MAG HYDROXIDE-SIMETH 200-200-20 MG/5ML PO SUSP
30.0000 mL | ORAL | Status: DC | PRN
  Administered 2017-04-03: 30 mL via ORAL
  Filled 2017-03-30 (×2): qty 30

## 2017-03-30 MED ORDER — OLANZAPINE 10 MG PO TABS
10.0000 mg | ORAL_TABLET | Freq: Every day | ORAL | Status: DC
  Administered 2017-03-30 – 2017-04-01 (×3): 10 mg via ORAL
  Filled 2017-03-30 (×6): qty 1

## 2017-03-30 MED ORDER — OLANZAPINE 5 MG PO TABS
5.0000 mg | ORAL_TABLET | Freq: Once | ORAL | Status: AC
  Administered 2017-03-30: 5 mg via ORAL
  Filled 2017-03-30 (×2): qty 1

## 2017-03-30 MED ORDER — MAGNESIUM HYDROXIDE 400 MG/5ML PO SUSP
30.0000 mL | Freq: Every day | ORAL | Status: DC | PRN
  Filled 2017-03-30: qty 30

## 2017-03-30 MED ORDER — HYDROXYZINE HCL 25 MG PO TABS
25.0000 mg | ORAL_TABLET | Freq: Three times a day (TID) | ORAL | Status: DC | PRN
  Administered 2017-03-31: 25 mg via ORAL
  Filled 2017-03-30 (×2): qty 1

## 2017-03-30 MED ORDER — ACETAMINOPHEN 325 MG PO TABS
650.0000 mg | ORAL_TABLET | Freq: Four times a day (QID) | ORAL | Status: DC | PRN
  Filled 2017-03-30: qty 2
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
## 2017-03-30 NOTE — Tx Team (Signed)
Initial Treatment Plan 03/30/2017 3:58 PM Janet Welch ENI:[id number]    PATIENT STRESSORS: Other: Past three weeks not sleeping   PATIENT STRENGTHS: Occupational psychologist fund of knowledge Physical Health Religious Affiliation Supportive family/friends   PATIENT IDENTIFIED PROBLEMS: Poor sleep  Psychosis  "I just need to sleep"                 DISCHARGE CRITERIA:  Improved stabilization in mood, thinking, and/or behavior Verbal commitment to aftercare and medication compliance  PRELIMINARY DISCHARGE PLAN: Outpatient therapy Medication management  PATIENT/FAMILY INVOLVEMENT: This treatment plan has been presented to and reviewed with the patient, Janet Welch.  The patient and family have been given the opportunity to ask questions and make suggestions.  Windell Moment, RN 03/30/2017, 3:58 PM

## 2017-03-30 NOTE — Progress Notes (Signed)
Emmalou is a 42 year old female being admitted voluntarily as a walk in to [facility].  She came in with her husband for poor sleep, thought blocking, paranoia and delusional thinking.  During [facility] admission, she was a poor historian.  She was unable to answer many questions about why she was here.  She did report that a stressor was her husband having back surgery recently.  She denies any pain or discomfort and appears to be in no physical distress.  She had difficulty with admission paperwork.  She was able to complete visitation form and belongings sheet.  She kept her head in her hands when trying to explain the treatment agreement.  She couldn't answer SI/HI or A/V hallucinations but did mention "I think I just need to sleep."  Oriented her to the unit.  Admission paperwork reviewed.  Belongings searched and no locker needed on admission, no contraband found.  Skin assessment completed and no skin issues noted.  Q 15 minute checks initiated for safety.  We will continue to monitor the progress towards her goals.

## 2017-03-30 NOTE — Plan of Care (Signed)
  Safety: Periods of time without injury will increase 03/30/2017 2239 - Progressing by Providence Crosby, RN Note Pt safe on the unit at this time

## 2017-03-30 NOTE — Progress Notes (Signed)
D: Pt presents paranoid, thought blocking and isolative characteristics.   A: Pt was offered support and encouragement. Pt was given scheduled medications. Pt was encourage to attend groups. Q 15 minute checks were done for safety.   R: safety maintained on unit.

## 2017-03-30 NOTE — BHH Group Notes (Signed)
Pt did not attend wrap up group this evening. Pt stayed in bed instead.  
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
## 2017-03-30 NOTE — Telephone Encounter (Signed)
Call was placed to patient husband Shamaine Mulkern in regards to an appointment patient had scheduled for today. The appointment was cancelled because the patient had a previous appointment with Olean Ree on 03/25/2017 at that appointment patient as well as her husband was informed that patient needed to follow up with psychology and psychiatry.  I called husband to cancel the appointment that was scheduled with Olean Ree today because per Olean Ree patient needs emergency psychiatry evaluation and she can go to [facility] long for emergency mental health treatment. Husband became very upset started talking about he was going to call executive director of [facility] as well as meet with the CEO of [facility] this afternoon. After explaining this to patient husband he then asked if Olean Ree could prescribe xanax to the patient. I explained the patient needed a mental health evaluation and they would prescribe what patient needed. I then told the husband I was sorry for the inconvience this has caused him but that he needed to schedule an appointment for mental health evaluationor take the patient to [facility]. Husband became very upset started yelling that he could not get the patient an appoinment and that If I hung the phone up then he was going to call the Optician, dispensing as well as the [employer]. I placed patient on hold to notify Olean Ree of what was going on and when I returned to again explain to Mr Arvin that he would have to call other locations to schedule patient an appointment he had called Lew Dawes while I placed him on hold. I then explained to her that the patient had been seen by Olean Ree on 03/25/2017 and was prescribed ambien. Lew Dawes then asked if patient could be seen today to be assessed by Olean Ree. I then placed Lew Dawes as well as Mr. Nass on hold to speak with Olean Ree she then told me to transfer the call to her. When I went back to the call to transfer Lelon Frohlich asked if I could give Olean Ree her number so  she could call her and not hold up Mr Hoerner time. I took down the number and told Lew Dawes I would have Olean Ree give her a call and that I was going to disconnect the call now.

## 2017-03-31 LAB — TSH: TSH: 1.749 u[IU]/mL (ref 0.350–4.500)

## 2017-03-31 LAB — COMPREHENSIVE METABOLIC PANEL
ALBUMIN: 4.6 g/dL (ref 3.5–5.0)
ALK PHOS: 47 U/L (ref 38–126)
ALT: 39 U/L (ref 14–54)
ANION GAP: 11 (ref 5–15)
AST: 22 U/L (ref 15–41)
BUN: 12 mg/dL (ref 6–20)
CALCIUM: 9.7 mg/dL (ref 8.9–10.3)
CO2: 26 mmol/L (ref 22–32)
Chloride: 102 mmol/L (ref 101–111)
Creatinine, Ser: 0.83 mg/dL (ref 0.44–1.00)
GFR calc Af Amer: >60 mL/min (ref >60)
GFR calc non Af Amer: >60 mL/min (ref >60)
GLUCOSE: 95 mg/dL (ref 65–99)
Potassium: 3.4 mmol/L — ABNORMAL LOW (ref 3.5–5.1)
SODIUM: 139 mmol/L (ref 135–145)
Total Bilirubin: 0.8 mg/dL (ref 0.3–1.2)
Total Protein: 7.7 g/dL (ref 6.5–8.1)

## 2017-03-31 LAB — LIPID PANEL
CHOL/HDL RATIO: 2.5 ratio
Cholesterol: 158 mg/dL (ref 0–200)
HDL: 63 mg/dL (ref >40)
LDL CALC: 79 mg/dL (ref 0–99)
Triglycerides: 81 mg/dL (ref <150)
VLDL: 16 mg/dL (ref 0–40)

## 2017-03-31 LAB — CBC
HCT: 45.4 % (ref 36.0–46.0)
HEMOGLOBIN: 14.7 g/dL (ref 12.0–15.0)
MCH: 29 pg (ref 26.0–34.0)
MCHC: 32.4 g/dL (ref 30.0–36.0)
MCV: 89.5 fL (ref 78.0–100.0)
Platelets: 305 10*3/uL (ref 150–400)
RBC: 5.07 MIL/uL (ref 3.87–5.11)
RDW: 13.6 % (ref 11.5–15.5)
WBC: 5.7 10*3/uL (ref 4.0–10.5)

## 2017-03-31 LAB — HEMOGLOBIN A1C
Hgb A1c MFr Bld: 5.1 % (ref 4.8–5.6)
MEAN PLASMA GLUCOSE: 99.67 mg/dL

## 2017-03-31 LAB — PREGNANCY, URINE: Preg Test, Ur: NEGATIVE
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
## 2017-03-31 NOTE — Progress Notes (Signed)
D: Patient denies SI or HI but states she is having decreased auditory hallucinations and the visual hallucinations are now "blurred visions". Patient presents as flat and anxious, stating that she slept well last night with the aid of the Zyprexa.  Pt. Is attending groups and interacting with staff and others.  Pt.'s goal for today is to focus on getting more rest.    A: Patient given emotional support from RN. Patient encouraged to come to staff with concerns and/or questions. Patient's medication routine continued. Patient's orders and plan of care reviewed.   R: Patient remains appropriate and cooperative. Will continue to monitor patient q15 minutes for safety.

## 2017-03-31 NOTE — Progress Notes (Signed)
Adult Psychoeducational Group Note  Date:  03/31/2017 Time:  9:42 AM  Group Topic/Focus:  Orientation:   The focus of this group is to educate the patient on the purpose and policies of crisis stabilization and provide a format to answer questions about their admission.  The group details unit policies and expectations of patients while admitted.  Participation Level:  Active  Participation Quality:  Appropriate  Affect:  Appropriate  Cognitive:  Alert  Insight: Appropriate  Engagement in Group:  Engaged  Modes of Intervention:  Discussion and Education  Additional Comments:  Pt participated in group. Pt's goal today is to get rest so she can be more aware and have better concentration.   Lita Mains 03/31/2017, 9:42 AM

## 2017-03-31 NOTE — BHH Counselor (Signed)
Adult Comprehensive Assessment  Patient ID: Janet Welch, female   DOB: February 01, 1975, 42 y.o.   MRN: [id number]  Information Source: Information source: Patient  Current Stressors:  Family Relationships: Pt reports she is very busy with responsibilities and never gets to rest. Physical health (include injuries & life threatening diseases): Pt unable to articulate specific stressors  Living/Environment/Situation:  Living Arrangements: Children, Spouse/significant other Living conditions (as described by patient or guardian): pt unable to describe How long has patient lived in current situation?: Pt unable to remember  Family History:  Marital status: Married Number of Years Married: 12 What types of issues is patient dealing with in the relationship?: Pt unable to answer. "I think it's going OK" Are you sexually active?: Yes What is your sexual orientation?: heterosexual Has your sexual activity been affected by drugs, alcohol, medication, or emotional stress?: Pt declined to answer Does patient have children?: Yes How many children?: 4 How is patient's relationship with their children?: sons ages 70,5,3.  daughter: 44.  Relationships "sometimes good, sometimes hard being so busy."  Childhood History:  By whom was/is the patient raised?: Both parents Additional childhood history information: Parents stayed together. Pt unable to describe childhood Description of patient's relationship with caregiver when they were a child: Pt unable to answer Patient's description of current relationship with people who raised him/her: "I feel like it's getting better" How were you disciplined when you got in trouble as a child/adolescent?: Pt unable to answer Does patient have siblings?: Yes Number of Siblings: 1 Description of patient's current relationship with siblings: younger brother: good relationship Did patient suffer any verbal/emotional/physical/sexual abuse as a child?: (Pt reports, "I have  memories of things and I don't know if they are real or not") Did patient suffer from severe childhood neglect?: (Pt unable to answer) Has patient ever been sexually abused/assaulted/raped as an adolescent or adult?: (Pt unable to answer) Was the patient ever a victim of a crime or a disaster?: (Pt unable to answer)  Education:  Highest grade of school patient has completed: Master's Degree in Health Currently a student?: No Learning disability?: (Pt unable to answer)  Employment/Work Situation:   Employment situation: (Pt unable to answer) Has patient ever been in the [employer]?: No Are There Guns or Other Weapons in [place]?: Yes(Guns are locked: "we have safes")  Financial Resources:   Financial resources: Income from spouse, Private insurance  Alcohol/Substance Abuse:   What has been your use of drugs/alcohol within the last 12 months?: alcohol: "a little if I need to sleep at night", denies drugs. If attempted suicide, did drugs/alcohol play a role in this?: No Alcohol/Substance Abuse Treatment Hx: (unable to answer) Has alcohol/substance abuse ever caused legal problems?: (pt unable to answer)  Social Support System:   [employer] Support System: (pt unable to answer) Describe Community Support System: pt unable to answer Type of faith/religion: pt unable to answer  Leisure/Recreation:   Leisure and Hobbies: Pt unable to answer  Strengths/Needs:   What things does the patient do well?: Pt unable to answer In what areas does patient struggle / problems for patient: Pt unable to answer  Discharge Plan:   Does patient have access to transportation?: Yes Will patient be returning to same living situation after discharge?: Yes Currently receiving community mental health services: No If no, would patient like referral for services when discharged?: Yes (What county?)([place]) Does patient have financial barriers related to discharge medications?:  No  Summary/Recommendations:   Summary and Recommendations (  to be completed by the evaluator): Pt is 42 year old female from [place]. [facility])  Pt is diagnosed with major depressive disorder with psychotic features and was admitted due to paranoia, delusions, and thought blocking.  Recomendations for pt include crisis stabilization, therapeuitic milieu, attend and participate in groups, medication management, and development of comprehensive mental wellness plan.  Joanne Chars. 03/31/2017

## 2017-03-31 NOTE — Progress Notes (Signed)
Patient ID: Janet Welch, female   DOB: Feb 06, 1975, 42 y.o.   MRN: [id number]  D: Patient in her room visiting with husband on approach. Pt reports the visit went well. Pt reports she feels her mood has improved since admission. Pt reports she is tolerating medication but c/o blurred vision at times. Pt mood and affect appeared depressed and flat. Pt denies SI/HI/AVH and pain. Cooperative with assessment.   A: Medications administered as prescribed. Support and encouragement provided to attend groups and engage in milieu. Pt encouraged to discuss feelings and come to staff with any question or concerns.   R: Patient remains safe and complaint with medications.
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
## 2017-04-01 DIAGNOSIS — Z6379 Other stressful life events affecting family and household: Secondary | ICD-10-CM

## 2017-04-01 DIAGNOSIS — Z62810 Personal history of physical and sexual abuse in childhood: Secondary | ICD-10-CM

## 2017-04-01 DIAGNOSIS — F312 Bipolar disorder, current episode manic severe with psychotic features: Secondary | ICD-10-CM | POA: Diagnosis present

## 2017-04-01 LAB — PROLACTIN: Prolactin: 17.8 ng/mL (ref 4.8–23.3)

## 2017-04-01 MED ORDER — OLANZAPINE 5 MG PO TBDP
5.0000 mg | ORAL_TABLET | Freq: Three times a day (TID) | ORAL | Status: DC | PRN
  Filled 2017-04-01: qty 1

## 2017-04-01 MED ORDER — HYDROXYZINE HCL 50 MG PO TABS
50.0000 mg | ORAL_TABLET | Freq: Three times a day (TID) | ORAL | Status: DC | PRN
Start: 2017-04-01 — End: 2017-04-03
  Administered 2017-04-01 – 2017-04-03 (×3): 50 mg via ORAL
  Filled 2017-04-01 (×3): qty 1

## 2017-04-01 MED ORDER — QUETIAPINE FUMARATE 50 MG PO TABS
50.0000 mg | ORAL_TABLET | Freq: Once | ORAL | Status: AC
  Administered 2017-04-01: 50 mg via ORAL
  Filled 2017-04-01 (×2): qty 1
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
## 2017-04-01 NOTE — [facility] Group Notes (Signed)
LCSW Group Therapy Note   04/01/2017 1:15pm   Type of Therapy and Topic:  Group Therapy:  Trust and Honesty  Participation Level:  Minimal  Description of Group:    In this group patients will be asked to explore the value of being honest.  Patients will be guided to discuss their thoughts, feelings, and behaviors related to honesty and trusting in others. Patients will process together how trust and honesty relate to forming relationships with peers, family members, and self. Each patient will be challenged to identify and express feelings of being vulnerable. Patients will discuss reasons why people are dishonest and identify alternative outcomes if one was truthful (to self or others). This group will be process-oriented, with patients participating in exploration of their own experiences, giving and receiving support, and processing challenge from other group members.   Therapeutic Goals: 1. Patient will identify why honesty is important to relationships and how honesty overall affects relationships.  2. Patient will identify a situation where they lied or were lied too and the  feelings, thought process, and behaviors surrounding the situation 3. Patient will identify the meaning of being vulnerable, how that feels, and how that correlates to being honest with self and others. 4. Patient will identify situations where they could have told the truth, but instead lied and explain reasons of dishonesty.   Summary of Patient Progress  Came initially, engaged.  Agreed that sarcasm is an unhealthy form of honesty.  Came up with acronym "trustworthy" for honesty.  Called out to see the Dr., and did not return.  Therapeutic Modalities:   Cognitive Behavioral Therapy Solution Focused Therapy Motivational Interviewing Brief Therapy  Trish Mage, LCSW 04/01/2017 1:57 PM

## 2017-04-01 NOTE — Progress Notes (Signed)
  DATA ACTION RESPONSE  Objective- Pt. is visible in the room, seen resting in bed with eyes open. Presents with a suspicious/paranoid/anxiousaffect and mood. Pt was guarded on approach. Pt states she hopes to be discharge soon.  Subjective- Denies having any SI/HI/AVH/Pain at this time. Is cooperative and remains safe on the unit.  1:1 interaction in private to establish rapport. Encouragement, education, & support given from staff.    Safety maintained with Q 15 checks. Continue with POC.

## 2017-04-01 NOTE — Progress Notes (Signed)
Recreation Therapy Notes  Date: 3.13.19 Time: 10:00 am  Location: [address]   Group Topic: Therapist, nutritional, Clinical research associate, Conservation officer, nature) Addresses:   Goal 1.1: To build healthy support systems - Patient will identify the importance of a healthy support system - Patient will identify at least one person in their support system  - Patient will identify ways on how to improve their support system                      Intervention: Horticulture, Art   Activity: Patients were given a cup to use as a flower vase. Patients had the opportunity to paint and decorate their vase however they wanted. Patients then took the provided flowers and placed them into their vase. Patients were encouraged to keep their plant or give to one person in their support system   Education: [place], Clinical research associate, Engineer, drilling, Discharge Planning    Education Outcome: Acknowledges Education  Clinical Observations/Feedback: Patient did not attend    Ranell Patrick, Recreation Therapy Intern   Ranell Patrick 04/01/2017 11:17 AM

## 2017-04-01 NOTE — Progress Notes (Signed)
Did not attend group 
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
## 2017-04-01 NOTE — Progress Notes (Signed)
Patient has been isolative to self this shift.  Patient denies SI, HI and AVH.  Patient reported feeling tired and spent time in her room resting stating this is the first time she has been able to rest in a while.   Assess patient for safety, offer medications as prescribed engage patient in 1:1 staff talks.   Patient able to contract for safety. Continue to monitor as planned.

## 2017-04-01 NOTE — Progress Notes (Signed)
Recreation Therapy Notes  INPATIENT RECREATION THERAPY ASSESSMENT  Patient Details Name: Janet Welch MRN: [id number] DOB: 12-Jul-1975 Today's Date: 04/01/2017       Information Obtained From: Patient  Able to Participate in Assessment/Interview: Yes  Patient Presentation: Responsive  Reason for Admission (Per Patient): Patient reports being admitted in to the hospital because of depression and paranoia    Patient Stressors: Family Patient reports that she has a lot going on with her family. Patient states that her husband just had surgery. Patient mentions that she has been overwhelmed with taking care of her husband and kids.   Coping Skills:   Isolation, Exercise, Prayer  Leisure Interests (2+):  Individual - Reading, Exercise - Walking, Nature   Frequency of Recreation/Participation: Patient states that she does not engage in activities often because she does not have the time to   Awareness of Community Resources:  Yes  Community Resources:  [place], [place], Pharmacist, community Theaters  Current Use: Yes  Expressed Interest in [place]: No  [employer] of Residence:  [place]   Patient Main Form of Transportation: Musician  Patient Strengths:  Programmer, systems   Patient Identified Areas of Improvement:  Asking for help   Patient Goal for Hospitalization:  Resting   Current SI (including self-harm):  No  Current HI:  No  Current AVH: No  Staff Intervention Plan: Group Attendance, Collaborate with Interdisciplinary Treatment Team  Consent to Intern Participation: Yes   Ranell Patrick, Recreation Therapy Intern   Ranell Patrick 04/01/2017, 2:25 PM

## 2017-04-02 LAB — HEMOGLOBIN A1C
HEMOGLOBIN A1C: 5.1 % (ref 4.8–5.6)
Mean Plasma Glucose: 99.67 mg/dL

## 2017-04-02 LAB — LIPID PANEL
Cholesterol: 153 mg/dL (ref 0–200)
HDL: 61 mg/dL (ref >40)
LDL CALC: 77 mg/dL (ref 0–99)
TRIGLYCERIDES: 76 mg/dL (ref <150)
Total CHOL/HDL Ratio: 2.5 RATIO
VLDL: 15 mg/dL (ref 0–40)

## 2017-04-02 LAB — TSH: TSH: 1.799 u[IU]/mL (ref 0.350–4.500)

## 2017-04-02 MED ORDER — OLANZAPINE 7.5 MG PO TABS
7.5000 mg | ORAL_TABLET | Freq: Every day | ORAL | Status: DC
  Administered 2017-04-02: 7.5 mg via ORAL
  Filled 2017-04-02 (×2): qty 1

## 2017-04-02 NOTE — Progress Notes (Signed)
Patient had a good day since she attended all of her groups. Her goal for tomorrow is to get discharged.

## 2017-04-02 NOTE — Progress Notes (Signed)
[facility] MD Progress Note  04/02/2017 3:20 PM Janet Welch  MRN:  [id number] Subjective:    Janet Welch is a 42 y/o F with history of treatment for depression and previous episode concerning for manic symptoms in context of head injury at age 73 who presented voluntarily brought in by family with worsening insomnia, disorganization, paranoia, and AH for the past several weeks. Pt was started on olanzapine while in the ED and transferred to [facility] for additional treatment and evaluation. Pt was continued on olanzapine, and she reported incremental improvement of her presenting symptoms.  Today upon evaluation, pt shares, "I'm doing good." She reports that she slept well and her appetite is good. She notes having some residual paranoia without any specific concern, and she rates the intensity as "3/10" as compared to "10/10" when she first came to the hospital. She also notes that her symptoms are episodic throughout the day rather than constant as compared to when she first came in. She denies SI/HI/AH/VH. She reports some feeling of "heaviness" in her arms and legs, and she is unsure what to associate that symptom with. She has no other physical complaints. She feels that her medications have been helpful for her symptoms. Discussed with patient about reducing evening dose of olanzapine as she may be feeling overly sedated from current olanzapine dose, and pt was in agreement. She was in agreement with the above plan, and she had no further questions, comments, or concerns.   Principal Problem: Bipolar I disorder, current or most recent episode manic, severe with mood-congruent psychotic features ([place]) Diagnosis:   Patient Active Problem List   Diagnosis Date Noted  . Bipolar I disorder, current or most recent episode manic, severe with mood-congruent psychotic features ([place]) [F31.2] 04/01/2017  . MDD (major depressive disorder), severe ([place]) [F32.2] 03/30/2017  . MDD (major depressive disorder), recurrent  severe, without psychosis ([place]) [F33.2] 03/30/2017   Total Time spent with patient: 30 minutes  Past Psychiatric History: see H&P  Past Medical History:  Past Medical History:  Diagnosis Date  . No pertinent past medical history     Past Surgical History:  Procedure Laterality Date  . CESAREAN SECTION N/A 07/07/2013   Procedure: CESAREAN SECTION;  Surgeon: Linda Hedges, DO;  Location: [facility];  Service: Obstetrics;  Laterality: N/A;  . LEG SKIN LESION  BIOPSY / EXCISION    . scar tissue biopsy     Family History:  Family History  Problem Relation Age of Onset  . Thyroid disease Mother   . Parkinson's disease Father    Family Psychiatric  History: see H&P Social History:  Social History   Substance and Sexual Activity  Alcohol Use No     Social History   Substance and Sexual Activity  Drug Use No    Social History   Socioeconomic History  . Marital status: Married    Spouse name: None  . Number of children: None  . Years of education: None  . Highest education level: None  Social Needs  . Financial resource strain: None  . Food insecurity - worry: None  . Food insecurity - inability: None  . Transportation needs - medical: None  . Transportation needs - non-medical: None  Occupational History  . None  Tobacco Use  . Smoking status: Never Smoker  . Smokeless tobacco: Never Used  Substance and Sexual Activity  . Alcohol use: No  . Drug use: No  . Sexual activity: Yes  Other Topics Concern  . None  Social History Narrative  . None   Additional Social History:    Pain Medications: please see mar Prescriptions: please see mar Over the Counter: please see mar History of alcohol / drug use?: No history of alcohol / drug abuse Longest period of sobriety (when/how long): NA                    Sleep: Good  Appetite:  Good  Current Medications: Current Facility-Administered Medications  Medication Dose Route Frequency Provider Last Rate Last  Dose  . acetaminophen (TYLENOL) tablet 650 mg  650 mg Oral Q6H PRN Derrill Center, NP      . alum & mag hydroxide-simeth (MAALOX/MYLANTA) 200-200-20 MG/5ML suspension 30 mL  30 mL Oral Q4H PRN Derrill Center, NP      . hydrOXYzine (ATARAX/VISTARIL) tablet 50 mg  50 mg Oral Q8H PRN Pennelope Bracken, MD   50 mg at 04/01/17 2353  . magnesium hydroxide (MILK OF MAGNESIA) suspension 30 mL  30 mL Oral Daily PRN Derrill Center, NP      . OLANZapine (ZYPREXA) tablet 7.5 mg  7.5 mg Oral QHS Celicia Minahan T, MD      . OLANZapine zydis (ZYPREXA) disintegrating tablet 5 mg  5 mg Oral TID PRN Nanci Pina, FNP        Lab Results:  Results for orders placed or performed during the hospital encounter of 03/30/17 (from the past 48 hour(s))  TSH     Status: None   Collection Time: 04/02/17  6:36 AM  Result Value Ref Range   TSH 1.799 0.350 - 4.500 uIU/mL    Comment: Performed by a 3rd Generation assay with a functional sensitivity of <=0.01 uIU/mL. Performed at [facility], [place] [address], [place], [place] [address]   Hemoglobin A1c     Status: None   Collection Time: 04/02/17  6:36 AM  Result Value Ref Range   Hgb A1c MFr Bld 5.1 4.8 - 5.6 %    Comment: (NOTE) Pre diabetes:          5.7%-6.4% Diabetes:              >6.4% Glycemic control for   <7.0% adults with diabetes    Mean Plasma Glucose 99.67 mg/dL    Comment: Performed at [place] [address], [place], [place] [address]  Lipid panel     Status: None   Collection Time: 04/02/17  6:36 AM  Result Value Ref Range   Cholesterol 153 0 - 200 mg/dL   Triglycerides 76 <150 mg/dL   HDL 61 >40 mg/dL   Total CHOL/HDL Ratio 2.5 RATIO   VLDL 15 0 - 40 mg/dL   LDL Cholesterol 77 0 - 99 mg/dL    Comment:        Total Cholesterol/HDL:CHD Risk Coronary Heart Disease Risk Table                     Men   Women  1/2 Average Risk   3.4   3.3  Average Risk       5.0   4.4  2 X Average Risk   9.6    7.1  3 X Average Risk  23.4   11.0        Use the calculated Patient Ratio above and the CHD Risk Table to determine the patient's CHD Risk.        ATP III CLASSIFICATION (LDL):  <  100     mg/dL   Optimal  100-129  mg/dL   Near or Above                    Optimal  130-159  mg/dL   Borderline  160-189  mg/dL   High  >190     mg/dL   Very High Performed at [place] [address], [place], [place] [address]     Blood Alcohol level:  Lab Results  Component Value Date   ETH <10 46/96/2952    Metabolic Disorder Labs: Lab Results  Component Value Date   HGBA1C 5.1 04/02/2017   MPG 99.67 04/02/2017   MPG 99.67 03/31/2017   Lab Results  Component Value Date   PROLACTIN 17.8 03/31/2017   Lab Results  Component Value Date   CHOL 153 04/02/2017   TRIG 76 04/02/2017   HDL 61 04/02/2017   CHOLHDL 2.5 04/02/2017   VLDL 15 04/02/2017   LDLCALC 77 04/02/2017   LDLCALC 79 03/31/2017    Physical Findings: AIMS: Facial and Oral Movements Muscles of Facial Expression: None, normal Lips and Perioral Area: None, normal Jaw: None, normal Tongue: None, normal,Extremity Movements Upper (arms, wrists, hands, fingers): None, normal Lower (legs, knees, ankles, toes): None, normal, Trunk Movements Neck, shoulders, hips: None, normal, Overall Severity Severity of abnormal movements (highest score from questions above): None, normal Incapacitation due to abnormal movements: None, normal Patient's awareness of abnormal movements (rate only patient's report): No Awareness, Dental Status Current problems with teeth and/or dentures?: No Does patient usually wear dentures?: No  CIWA:    COWS:     Musculoskeletal: Strength & Muscle Tone: within normal limits Gait & Station: normal Patient leans: N/A  Psychiatric Specialty Exam: Physical Exam  Nursing note and vitals reviewed.   Review of Systems  Constitutional: Negative for chills and fever.  Respiratory:  Negative for cough and shortness of breath.   Cardiovascular: Negative for chest pain.  Gastrointestinal: Negative for abdominal pain, heartburn, nausea and vomiting.  Psychiatric/Behavioral: Negative for depression, hallucinations and suicidal ideas. The patient is not nervous/anxious and does not have insomnia.     Blood pressure (!) 142/80, pulse 89, temperature 97.7 F (36.5 C), resp. rate 16, height 5\' 7"  (1.702 m), weight 71.7 kg (158 lb).Body mass index is 24.75 kg/m.  General Appearance: Casual and Well Groomed  Eye Contact:  Good  Speech:  Clear and Coherent and Normal Rate  Volume:  Normal  Mood:  Euthymic  Affect:  Appropriate, Congruent and Flat  Thought Process:  Coherent and Goal Directed  Orientation:  Full (Time, Place, and Person)  Thought Content:  Paranoid Ideation  Suicidal Thoughts:  No  Homicidal Thoughts:  No  Memory:  Immediate;   Fair Recent;   Fair Remote;   Fair  Judgement:  Fair  Insight:  Fair  Psychomotor Activity:  Psychomotor Retardation  Concentration:  Concentration: Fair  Recall:  [place] of Knowledge:  Good  Language:  Fair  Akathisia:  No  Handed:    AIMS (if indicated):     Assets:  Communication Skills Desire for Improvement Financial Resources/Insurance Housing Resilience Social Support  ADL's:  Intact  Cognition:  WNL  Sleep:  Number of Hours: 5.25   Treatment Plan Summary: Daily contact with patient to assess and evaluate symptoms and progress in treatment and Medication management   - Continue inpatient hospitalization  - Bipolar I, current episode manic, with psychotic features             -  Change olanzapine 10mg  po qhs to olanzapine 7.5mg  po qhs             - Continue zydis 5mg  po q8h prn psychosis  - Anxiety             - Continue atarax 25mg  po TID prn anxiety/insomnia  - Insomnia             - See above atarax order  -Encourage participation in groups and therapeutic milieu  -Discharge planning will be  ongoing  Pennelope Bracken, MD 04/02/2017, 3:20 PM

## 2017-04-02 NOTE — Progress Notes (Signed)
Recreation Therapy Notes  Date: 3.14.19 Time: 10:00 a.m. Location: 500 [address] Dayroom   Group Topic: Communication   Goal Area(s) Addresses:  Goal 1.1: To improve communication  - Group will communicate with peers during group session    - Group will identify the importance of healthy communication  - Group will answer at least three questions during Recreation Therapy tx  Behavioral Response: Engaged   Intervention: Game   Activity: Jenga: Patients played Jenga as normal. When a patient pulled out a Jenga piece, based on the color of the skittle, the patient answered a specific question for that color.   Education: Communication, Team-Work   Education Outcome: Acknowledges education  Clinical Observations/Feedback: Patient attended and participated appropriately in Recreation Therapy group treatment successfully engaging in group activity. Patient was able to communicate with peers in a appropriate manner. Patient was able to answer at least three questions about communication. Patient successfully met Goal 1.1 (see above).   Twilla Khouri, Recreation Therapy Intern   Janet Welch 04/02/2017 8:39 AM 

## 2017-04-02 NOTE — Progress Notes (Signed)
D: Patient denies SI, HI or AVH tonight. Patient presents as flat and anxious but pleasant and appropriate.  Pt. States that she had a good day and is preparing for discharge tomorrow.  Pt. States that the medication has helped her but sometimes when she awakes from sleep she feels confused and has some blurred vision that clears in a short amount of time.  She denies any other physical complaints.  Pt. Is visualized on the unit attending groups and interacting with staff and others on the unit.    A: Patient given emotional support from RN. Patient encouraged to come to staff with concerns and/or questions. Patient's medication routine continued. Patient's orders and plan of care reviewed.   R: Patient remains appropriate and cooperative. Will continue to monitor patient q15 minutes for safety.

## 2017-04-02 NOTE — B[facility] Group Notes (Signed)
[facility] Group Therapy 04/02/2017 10:40 AM   Type of Therapy: Mental Health Association Presentation   Participation Level: Active   Participation Quality: Attentive   Affect: Appropriate   Cognitive: Oriented   Insight: Developing/Improving   Engagement in Therapy: Engaged   Modes of Intervention: Discussion, Education and Socialization   Summary of Progress/Problems: Janet ([place]) Speaker came to talk about his personal journey with mental health. The pt processed ways by which to relate to the speaker. [place] speaker provided handouts and educational information pertaining to groups and services offered by the [facility]. Pt was engaged in speaker's presentation and was receptive to resources provided.      Janet Welch, [place] 04/02/2017 10:40 AM

## 2017-04-02 NOTE — Tx Team (Addendum)
Interdisciplinary Treatment and Diagnostic Plan Update  04/02/2017 Time of Session: 9:08 AM  MELIA HOPES MRN: [id number]  Principal Diagnosis: Bipolar I disorder, current or most recent episode manic, severe with mood-congruent psychotic features ([place])  Secondary Diagnoses: Principal Problem:   Bipolar I disorder, current or most recent episode manic, severe with mood-congruent psychotic features ([place] )   Current Medications:  Current Facility-Administered Medications  Medication Dose Route Frequency Provider Last Rate Last Dose  . acetaminophen (TYLENOL) tablet 650 mg  650 mg Oral Q6H PRN Derrill Center, NP      . alum & mag hydroxide-simeth (MAALOX/MYLANTA) 200-200-20 MG/5ML suspension 30 mL  30 mL Oral Q4H PRN Derrill Center, NP      . hydrOXYzine (ATARAX/VISTARIL) tablet 50 mg  50 mg Oral Q8H PRN Pennelope Bracken, MD   50 mg at 04/01/17 2353  . magnesium hydroxide (MILK OF MAGNESIA) suspension 30 mL  30 mL Oral Daily PRN Derrill Center, NP      . OLANZapine (ZYPREXA) tablet 10 mg  10 mg Oral QHS Derrill Center, NP   10 mg at 04/01/17 2116  . OLANZapine zydis (ZYPREXA) disintegrating tablet 5 mg  5 mg Oral TID PRN Nanci Pina, FNP        PTA Medications: Medications Prior to Admission  Medication Sig Dispense Refill Last Dose  . zolpidem (AMBIEN) 10 MG tablet Take 1 tablet (10 mg total) by mouth at bedtime as needed for sleep. 30 tablet 0 03/25/2017 at Unknown time    Patient Stressors: Other: Past three weeks not sleeping  Patient Strengths: Occupational psychologist fund of knowledge Physical Health Religious Affiliation Supportive family/friends  Treatment Modalities: Medication Management, Group therapy, Case management,  1 to 1 session with clinician, Psychoeducation, Recreational therapy.   Physician Treatment Plan for Primary Diagnosis: Bipolar I disorder, current or most recent episode manic, severe with mood-congruent psychotic  features ([place]) Long Term Goal(s): Improvement in symptoms so as ready for discharge  Short Term Goals: Ability to identify changes in lifestyle to reduce recurrence of condition will improve Ability to verbalize feelings will improve Ability to disclose and discuss suicidal ideas Ability to demonstrate self-control will improve Ability to identify and develop effective coping behaviors will improve Ability to maintain clinical measurements within normal limits will improve Compliance with prescribed medications will improve Ability to identify triggers associated with substance abuse/mental health issues will improve  Medication Management: Evaluate patient's response, side effects, and tolerance of medication regimen.  Therapeutic Interventions: 1 to 1 sessions, Unit Group sessions and Medication administration.  Evaluation of Outcomes: Progressing  Physician Treatment Plan for Secondary Diagnosis: Principal Problem:   Bipolar I disorder, current or most recent episode manic, severe with mood-congruent psychotic features ([place])   Long Term Goal(s): Improvement in symptoms so as ready for discharge  Short Term Goals: Ability to identify changes in lifestyle to reduce recurrence of condition will improve Ability to verbalize feelings will improve Ability to disclose and discuss suicidal ideas Ability to demonstrate self-control will improve Ability to identify and develop effective coping behaviors will improve Ability to maintain clinical measurements within normal limits will improve Compliance with prescribed medications will improve Ability to identify triggers associated with substance abuse/mental health issues will improve  Medication Management: Evaluate patient's response, side effects, and tolerance of medication regimen.  Therapeutic Interventions: 1 to 1 sessions, Unit Group sessions and Medication administration.  Evaluation of Outcomes: Progressing   RN Treatment  Plan for  Primary Diagnosis: Bipolar I disorder, current or most recent episode manic, severe with mood-congruent psychotic features ([place]) Long Term Goal(s): Knowledge of disease and therapeutic regimen to maintain health will improve  Short Term Goals: Ability to identify and develop effective coping behaviors will improve and Compliance with prescribed medications will improve  Medication Management: RN will administer medications as ordered by provider, will assess and evaluate patient's response and provide education to patient for prescribed medication. RN will report any adverse and/or side effects to prescribing provider.  Therapeutic Interventions: 1 on 1 counseling sessions, Psychoeducation, Medication administration, Evaluate responses to treatment, Monitor vital signs and CBGs as ordered, Perform/monitor CIWA, COWS, AIMS and Fall Risk screenings as ordered, Perform wound care treatments as ordered.  Evaluation of Outcomes: Progressing   LCSW Treatment Plan for Primary Diagnosis: Bipolar I disorder, current or most recent episode manic, severe with mood-congruent psychotic features ([place]) Long Term Goal(s): Safe transition to appropriate next level of care at discharge, Engage patient in therapeutic group addressing interpersonal concerns.  Short Term Goals: Engage patient in aftercare planning with referrals and resources  Therapeutic Interventions: Assess for all discharge needs, 1 to 1 time with Social worker, Explore available resources and support systems, Assess for adequacy in community support network, Educate family and significant other(s) on suicide prevention, Complete Psychosocial Assessment, Interpersonal group therapy.  Evaluation of Outcomes: Met  Return home, follow up outpt   Progress in Treatment: Attending groups: Yes Participating in groups: Yes Taking medication as prescribed: Yes Toleration medication: Yes, no side effects reported at this  time Family/Significant other contact made: Yes Patient understands diagnosis: Yes AEB asking for help with sleep Discussing patient identified problems/goals with staff: Yes Medical problems stabilized or resolved: Yes Denies suicidal/homicidal ideation: Yes Issues/concerns per patient self-inventory: None Other: N/A  New problem(s) identified: None identified at this time.   New Short Term/Long Term Goal(s): Pt was unable to identify any goals, nor was she willing to sign.  Discharge Plan or Barriers:   Reason for Continuation of Hospitalization: Poor sleep Mania [place] Medication stabilization   Estimated Length of Stay: 3/18  Attendees: Patient:  04/02/2017  9:08 AM  Physician: Maris Berger, MD 04/02/2017  9:08 AM  Nursing: Sena Hitch, RN 04/02/2017  9:08 AM  RN Care Manager: Lars Pinks, RN 04/02/2017  9:08 AM  Social Worker: Ripley Fraise 04/02/2017  9:08 AM  Recreational Therapist: Winfield Cunas 04/02/2017  9:08 AM  Other: Norberto Sorenson 04/02/2017  9:08 AM  Other:  04/02/2017  9:08 AM    Scribe for Treatment Team:  Roque Lias LCSW 04/02/2017 9:08 AM

## 2017-04-02 NOTE — Progress Notes (Signed)
DAR NOTE: Patient presents with anxious affect and mood.  Denies pain, auditory and visual hallucinations.  Rates depression at 0, hopelessness at 0, and anxiety at 0.  Maintained on routine safety checks.  Medications given as prescribed.  Support and encouragement offered as needed.  Attended group and participated.  States goal for today is "discharge plan."  Patient remained in her room most of this shift. Vistaril 50 mg given for symptoms of anxiety with good effect.

## 2017-04-03 LAB — PROLACTIN: PROLACTIN: 31.8 ng/mL — AB (ref 4.8–23.3)

## 2017-04-03 MED ORDER — OLANZAPINE 5 MG PO TABS
5.0000 mg | ORAL_TABLET | Freq: Every day | ORAL | Status: DC
  Filled 2017-04-03: qty 1

## 2017-04-03 MED ORDER — HYDROXYZINE HCL 50 MG PO TABS: 50.0000 mg | ORAL_TABLET | Freq: Three times a day (TID) | ORAL | 0 refills | Status: DC | PRN

## 2017-04-03 MED ORDER — OLANZAPINE 5 MG PO TABS: 5.0000 mg | ORAL_TABLET | Freq: Every day | ORAL | 0 refills | Status: DC

## 2017-04-03 NOTE — Progress Notes (Signed)
  [facility] Adult Case Management Discharge Plan :  Will you be returning to the same living situation after discharge:  Yes,  home At discharge, do you have transportation home?: Yes,  husband Do you have the ability to pay for your medications: Yes,  insurance  Release of information consent forms completed and in the chart;  Patient's signature needed at discharge.  Patient to Follow up at: [facility] Follow up on 04/17/2017.   Why:  Friday at [facility] with Diona Foley for your hospital follow up appointment Contact information: [place] [address] [phone]           Next level of care provider has access to [facility] Link:No  Safety Planning and Suicide Prevention discussed: Yes,  yes  Have you used any form of tobacco in the last 30 days? (Cigarettes, Smokeless Tobacco, Cigars, and/or Pipes): No  Has patient been referred to the Quitline?: N/A patient is not a smoker  Patient has been referred for addiction treatment: [place], LCSW 04/03/2017, 10:55 AM

## 2017-04-03 NOTE — Progress Notes (Signed)
Recreation Therapy Notes  INPATIENT RECREATION TR PLAN  Patient Details Name: Janet Welch MRN: [id number] DOB: 07/31/1975 Today's Date: 04/03/2017  Rec Therapy Plan Is patient appropriate for Therapeutic Recreation?: Yes Treatment times per week: At least three  Estimated Length of Stay: 5-7 days  TR Treatment/Interventions: Group participation (appropriate particicpation in Recreation Therapy group tx.)  Discharge Criteria Pt will be discharged from therapy if:: Discharged Treatment plan/goals/alternatives discussed and agreed upon by:: Patient/family  Discharge Summary Short term goals set: See Care Plan  Short term goals met: Adequate for discharge Progress toward goals comments: Groups attended Which groups?: Communication Therapeutic equipment acquired: None  Reason patient discharged from therapy: Discharge from hospital Pt/family agrees with progress & goals achieved: Yes Date patient discharged from therapy: 04/03/17   , Recreation Therapy Intern     04/03/2017, 11:23 AM  

## 2017-04-03 NOTE — Progress Notes (Signed)
Recreation Therapy Notes  Date: 3.15.19 Time: 10:00 a.m. Location: 500 Hall Dayroom  Group Topic: Stress Management  Goal Area(s) Addresses:  Goal 1.1: To reduce stress  -Patient will report feeling a reduction in stress level  -Patient will identify the importance of stress management  -Patient will participate during stress management group treatment    Intervention: Stress Management  Activity: Yoga- Recreation Therapy Intern instructed a yoga group. Patients were in a peaceful environment with soft lighting enhancing patients mood.   Education: Stress Management, Discharge Planning.   Education Outcome: Acknowledges edcuation/In group clarification offered/Needs additional education  Clinical Observations/Feedback:: Patient did not attend    Ranell Patrick, Recreation Therapy Intern   Ranell Patrick 04/03/2017 10:41 AM

## 2017-04-03 NOTE — Plan of Care (Signed)
3.15.19 Patient attend and participated once during Recreation Therapy group treatment

## 2017-04-03 NOTE — Progress Notes (Signed)
Patient discharged to lobby. Patient was stable and appreciative at that time. All papers and prescriptions were given and valuables returned. Verbal understanding expressed. Denies SI/HI and A/VH. Patient given opportunity to express concerns and ask questions.  

## 2017-04-03 NOTE — [facility] Suicide Risk Assessment (Signed)
[facility] Discharge Suicide Risk Assessment   Principal Problem: Bipolar I disorder, current or most recent episode manic, severe with mood-congruent psychotic features [facility]) Discharge Diagnoses:  Patient Active Problem List   Diagnosis Date Noted  . Bipolar I disorder, current or most recent episode manic, severe with mood-congruent psychotic features ([place]) [F31.2] 04/01/2017  . MDD (major depressive disorder), severe ([place]) [F32.2] 03/30/2017  . MDD (major depressive disorder), recurrent severe, without psychosis ([place]) [F33.2] 03/30/2017    Total Time spent with patient: 30 minutes  Musculoskeletal: Strength & Muscle Tone: within normal limits Gait & Station: normal Patient leans: N/A  Psychiatric Specialty Exam: Review of Systems  Constitutional: Negative for chills and fever.  Respiratory: Negative for cough and shortness of breath.   Cardiovascular: Negative for chest pain.  Gastrointestinal: Negative for abdominal pain, heartburn, nausea and vomiting.  Psychiatric/Behavioral: Negative for depression, hallucinations and suicidal ideas. The patient is not nervous/anxious.     Blood pressure 116/64, pulse (!) 105, temperature 97.9 F (36.6 C), temperature source Oral, resp. rate 18, height 5\' 7"  (1.702 m), weight 71.7 kg (158 lb).Body mass index is 24.75 kg/m.  General Appearance: Fairly Groomed  Engineer, water::  Good  Speech:  Clear and Coherent and Normal Rate  Volume:  Normal  Mood:  Anxious and Euthymic  Affect:  Appropriate, Congruent and Constricted  Thought Process:  Coherent and Goal Directed  Orientation:  Full (Time, Place, and Person)  Thought Content:  Logical  Suicidal Thoughts:  No  Homicidal Thoughts:  No  Memory:  Immediate;   Fair Recent;   Fair Remote;   Fair  Judgement:  Fair  Insight:  Fair  Psychomotor Activity:  Normal  Concentration:  Good  Recall:  Good  Fund of Knowledge:Fair  Language: Good  Akathisia:  No  Handed:    AIMS (if indicated):      Assets:  Communication Skills Resilience  Sleep:  Number of Hours: 5.75  Cognition: WNL  ADL's:  Intact   Mental Status Per Nursing Assessment::   On Admission:  NA  Demographic Factors:  NA  Loss Factors: NA  Historical Factors: NA  Risk Reduction Factors:   Responsible for children under 68 years of age, Sense of responsibility to family, Employed, Living with another person, especially a relative, Positive social support, Positive therapeutic relationship and Positive coping skills or problem solving skills  Continued Clinical Symptoms:  Bipolar Disorder:   Mixed State  Cognitive Features That Contribute To Risk:  None    Suicide Risk:  Minimal: No identifiable suicidal ideation.  Patients presenting with no risk factors but with morbid ruminations; may be classified as minimal risk based on the severity of the depressive symptoms  [place] Follow up on 04/17/2017.   Why:  Friday at [facility] with Diona Foley for your hospital follow up appointment Contact information: [address] [place] [address] [phone]         Subjective Data: Janet Welch is a 42 y/o F with history of treatment for depression and previous episode concerning for manic symptoms in context of head injury at age 70 who presented voluntarily brought in by family with worsening insomnia, disorganization, paranoia, and AH for the past several weeks. Pt was started on olanzapine while in the ED and transferred to [facility] for additional treatment and evaluation. Pt was continued on olanzapine, and she reported incremental improvement of her presenting symptoms. Dose of olanzapine was reduced due to  daytime sedation, but pt continued to have improvement of presenting symptoms.  Today upon evaluation, pt shares, "I'm doing good, I feel a little groggy. I'm ready to go home though." She denies SI/HI/AH/VH. She notes that she had a nap yesterday afternoon and  then took her evening medications, and she slept until about 3AM at which time she woke up and took a vistaril, and now she is feeling groggy this morning. Discussed with patient that her medications can cause some AM grogginess, and we will reduce olanzapine to 5mg  at bedtime. 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 Discussed with patient about possibly transitioning to a different medication for mood stabilization at her first follow up appointment, and pt agreed to discuss that option with her outpatient provider. She was able to engage in safety planning including plan to return to [facility] or contact emergency services if she feels unable to maintain her own safety or the safety of others. Pt had no further questions, comments, or concerns.   Plan Of Care/Follow-up recommendations:   - Discharge to outpatient level of care  - Bipolar I, current episode manic, with psychotic features - Change olanzapine 7.5mg  po qhs to olanzapine 5mg  po qhs  - Anxiety - Continue atarax 50mg  po TID prn anxiety/insomnia  - Insomnia - See above atarax order  Activity:  as tolerated Diet:  normal Tests:  NA Other:  see above for [place], MD 04/03/2017, 10:03 AM

## 2017-04-03 NOTE — Discharge Summary (Addendum)
Physician Discharge Summary Note  Patient:  Janet Welch is an 42 y.o., female MRN:  [id number] DOB:  [birth year] Patient phone:  [phone] (home)  Patient address:   [address] [place] [address],  Total Time spent with patient: Greater than 30 minutes  Date of Admission:  03/30/2017 Date of Discharge: 04-03-17   Reason for Admission: Worsening insomnia, disorganization, paranoia & auditory hallucinations.  Principal Problem: Bipolar I disorder, current or most recent episode manic, severe with mood-congruent psychotic features [facility])  Discharge Diagnoses: Patient Active Problem List   Diagnosis Date Noted  . Bipolar I disorder, current or most recent episode manic, severe with mood-congruent psychotic features ([place]) [F31.2] 04/01/2017  . MDD (major depressive disorder), severe ([place]) [F32.2] 03/30/2017  . MDD (major depressive disorder), recurrent severe, without psychosis ([place]) [F33.2] 03/30/2017   Past Psychiatric History: Bipolar 1 disorder, Psychosis.  Past Medical History:  Past Medical History:  Diagnosis Date  . No pertinent past medical history     Past Surgical History:  Procedure Laterality Date  . CESAREAN SECTION N/A 07/07/2013   Procedure: CESAREAN SECTION;  Surgeon: Linda Hedges, DO;  Location: [facility];  Service: Obstetrics;  Laterality: N/A;  . LEG SKIN LESION  BIOPSY / EXCISION    . scar tissue biopsy     Family History:  Family History  Problem Relation Age of Onset  . Thyroid disease Mother   . Parkinson's disease Father    Family Psychiatric  History: See H&P Social History:  Social History   Substance and Sexual Activity  Alcohol Use No     Social History   Substance and Sexual Activity  Drug Use No    Social History   Socioeconomic History  . Marital status: Married    Spouse name: None  . Number of children: None  . Years of education: None  . Highest education level: None  Social Needs  . Financial resource strain: None   . Food insecurity - worry: None  . Food insecurity - inability: None  . Transportation needs - medical: None  . Transportation needs - non-medical: None  Occupational History  . None  Tobacco Use  . Smoking status: Never Smoker  . Smokeless tobacco: Never Used  Substance and Sexual Activity  . Alcohol use: No  . Drug use: No  . Sexual activity: Yes  Other Topics Concern  . None  Social History Narrative  . None   Hospital Course: (Per Md's SRA): Janet Welch is a 42 y/o F with history of treatment for depression and previous episode concerning for manic symptoms in context of head injury at age 41 who presentedvoluntarily brought in by family with worsening insomnia, disorganization, paranoia, and AH for the past several weeks. Pt was started on olanzapine while in the ED and transferred to [facility] for additional treatment and evaluation. Pt was continued on olanzapine, and she reported incremental improvement of her presenting symptoms. Dose of olanzapine was reduced due to daytime sedation, but pt continued to have improvement of presenting symptoms. She also was medicated & discharged on Hydroxyzine prn for anxiety.  Today upon her discharge evaluation with the attending pschiatrist, pt shares, "I'm doing good, I feel a little groggy. I'm ready to go home though." She denies SI/HI/AH/VH. She notes that she had a nap yesterday afternoon and then took her evening medications, and she slept until about 3AM at which time she woke up and took a vistaril, and now she is feeling groggy this morning.  Discussed with patient that her medications can cause some AM grogginess, and we will reduce olanzapine to 5mg  at bedtime. 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 Discussed with patient about  possibly transitioning to a different medication for mood stabilization at her first follow up appointment, and pt agreed to discuss that option with her outpatient provider. She was able to engage in safety planning including plan to return to [facility] or contact emergency services if she feels unable to maintain her own safety or the safety of others. Pt had no further questions, comments, or concerns.  Upon discharge, Janet Welch was both mentally & medically stable. She will continue further mental health care on an outpatient basis as noted below. She is provided with all the necessary information needed to make this appointment without problems. She left [facility] with all personal belongings in no apparent distress. Transportation per husband.  Physical Findings: AIMS: Facial and Oral Movements Muscles of Facial Expression: None, normal Lips and Perioral Area: None, normal Jaw: None, normal Tongue: None, normal,Extremity Movements Upper (arms, wrists, hands, fingers): None, normal Lower (legs, knees, ankles, toes): None, normal, Trunk Movements Neck, shoulders, hips: None, normal, Overall Severity Severity of abnormal movements (highest score from questions above): None, normal Incapacitation due to abnormal movements: None, normal Patient's awareness of abnormal movements (rate only patient's report): No Awareness, Dental Status Current problems with teeth and/or dentures?: No Does patient usually wear dentures?: No  CIWA:    COWS:     Musculoskeletal: Strength & Muscle Tone: within normal limits Gait & Station: normal Patient leans: N/A  Psychiatric Specialty Exam: Physical Exam  Constitutional: She appears well-developed.  HENT:  Head: Normocephalic.  Eyes: Pupils are equal, round, and reactive to light.  Neck: Normal range of motion.  Cardiovascular:  Slightly elevated pulse rate (105).  Respiratory: Effort normal.  GI: Soft.  Genitourinary:  Genitourinary Comments: Deferred   Musculoskeletal: Normal range of motion.  Neurological: She is alert.  Skin: Skin is warm.    Review of Systems  Constitutional: Negative.   HENT: Negative.   Eyes: Negative.   Respiratory: Negative.   Cardiovascular: Negative.   Gastrointestinal: Negative.   Genitourinary: Negative.   Musculoskeletal: Negative.   Skin: Negative.   Neurological: Negative.   Endo/Heme/Allergies: Negative.   Psychiatric/Behavioral: Positive for depression (Stable) and hallucinations (Hx. psychosis). Negative for memory loss, substance abuse and suicidal ideas. The patient has insomnia (Stable). The patient is not nervous/anxious.     Blood pressure 116/64, pulse (!) 105, temperature 97.9 F (36.6 C), temperature source Oral, resp. rate 18, height 5\' 7"  (1.702 m), weight 71.7 kg (158 lb).Body mass index is 24.75 kg/m.  See Md's SRA   Have you used any form of tobacco in the last 30 days? (Cigarettes, Smokeless Tobacco, Cigars, and/or Pipes): No  Has this patient used any form of tobacco in the last 30 days? (Cigarettes, Smokeless Tobacco, Cigars, and/or Pipes): N/A  Blood Alcohol level:  Lab Results  Component Value Date   ETH <10 07/37/1062    Metabolic Disorder Labs:  Lab Results  Component Value Date   HGBA1C 5.1 04/02/2017   MPG 99.67 04/02/2017   MPG 99.67 03/31/2017   Lab Results  Component Value Date   PROLACTIN 31.8 (H) 04/02/2017   PROLACTIN 17.8 03/31/2017   Lab Results  Component Value  Date   CHOL 153 04/02/2017   TRIG 76 04/02/2017   HDL 61 04/02/2017   CHOLHDL 2.5 04/02/2017   VLDL 15 04/02/2017   LDLCALC 77 04/02/2017   LDLCALC 79 03/31/2017   See Psychiatric Specialty Exam and Suicide Risk Assessment completed by Attending Physician prior to discharge.  Discharge destination:  Home  Is patient on multiple antipsychotic therapies at discharge:  No   Has Patient had three or more failed trials of antipsychotic monotherapy by history:  No  Recommended Plan for  Multiple Antipsychotic Therapies: NA   Allergies as of 04/03/2017   No Known Allergies     Medication List    STOP taking these medications   zolpidem 10 MG tablet Commonly known as:  AMBIEN     TAKE these medications     Indication  hydrOXYzine 50 MG tablet Commonly known as:  ATARAX/VISTARIL Take 1 tablet (50 mg total) by mouth every 8 (eight) hours as needed for anxiety (insomnia).  Indication:  Feeling Anxious, Insomnai   OLANZapine 5 MG tablet Commonly known as:  ZYPREXA Take 1 tablet (5 mg total) by mouth at bedtime. For mood control  Indication:  Mood control      [facility]-up [facility] Follow up on 04/17/2017.   Why:  Friday at [facility] with Diona Foley for your hospital follow up appointment Contact information: [address] [place] [address] [phone]          Follow-up recommendations: Activity:  As tolerated Diet: As recommended by your primary care doctor. Keep all scheduled follow-up appointments as recommended.  Comments: Patient is instructed prior to discharge to: Take all medications as prescribed by his/her mental healthcare provider. Report any adverse effects and or reactions from the medicines to his/her outpatient provider promptly. Patient has been instructed & cautioned: To not engage in alcohol and or illegal drug use while on prescription medicines. In the event of worsening symptoms, patient is instructed to call the crisis hotline, 911 and or go to the nearest ED for appropriate evaluation and treatment of symptoms. To follow-up with his/her primary care provider for your other medical issues, concerns and or health care needs.   Signed: Lindell Spar, NP, pmhnp, fnp-bc 04/06/2017, 8:33 AM   Patient seen, Suicide Assessment Completed.  Disposition Plan Reviewed

## 2017-04-23 DIAGNOSIS — F3130 Bipolar disorder, current episode depressed, mild or moderate severity, unspecified: Secondary | ICD-10-CM | POA: Diagnosis not present

## 2017-04-28 DIAGNOSIS — F3130 Bipolar disorder, current episode depressed, mild or moderate severity, unspecified: Secondary | ICD-10-CM | POA: Diagnosis not present

## 2017-04-29 ENCOUNTER — Encounter: Payer: Self-pay | Admitting: Physician Assistant

## 2017-04-29 ENCOUNTER — Ambulatory Visit (INDEPENDENT_AMBULATORY_CARE_PROVIDER_SITE_OTHER): Payer: BLUE CROSS/BLUE SHIELD | Admitting: Physician Assistant

## 2017-04-29 VITALS — BP 118/82 | HR 67 | Temp 97.7°F | Resp 16 | Ht 67.0 in | Wt 164.8 lb

## 2017-04-29 DIAGNOSIS — F332 Major depressive disorder, recurrent severe without psychotic features: Secondary | ICD-10-CM | POA: Diagnosis not present

## 2017-04-29 DIAGNOSIS — F322 Major depressive disorder, single episode, severe without psychotic features: Secondary | ICD-10-CM

## 2017-04-29 DIAGNOSIS — F312 Bipolar disorder, current episode manic severe with psychotic features: Secondary | ICD-10-CM | POA: Diagnosis not present

## 2017-04-29 MED ORDER — OLANZAPINE 5 MG PO TABS: 5.0000 mg | ORAL_TABLET | Freq: Every day | ORAL | 0 refills | Status: DC

## 2017-04-29 NOTE — Progress Notes (Signed)
Patient ID: Janet Welch MRN: [id number], DOB: Jul 23, 1975, 42 y.o. Date of Encounter: @DATE @  Chief Complaint:  Chief Complaint  Patient presents with  . Medication Refill    HPI: 42 y.o. year old female  presents with above.  Today I have reviewed chart.  I have reviewed her office visit note visit with me 03/25/17.  I have also reviewed discharge summary note from her hospitalization at behavioral health 03/30/17 through 04/03/17.   ------------------------------------------------------------------------------------------------------------------------------------------------------------------------------------------------------------------------------------------------------------------------------------- The following information was documented in that discharge note and is copied directly from that note:  Date of Admission:  03/30/2017 Date of Discharge: 04-03-17   Reason for Admission: Worsening insomnia, disorganization, paranoia & auditory hallucinations.  Principal Problem: Bipolar I disorder, current or most recent episode manic, severe with mood-congruent psychotic features [facility])  Discharge Diagnoses:     Patient Active Problem List   Diagnosis Date Noted  . Bipolar I disorder, current or most recent episode manic, severe with mood-congruent psychotic features ([place]) [F31.2] 04/01/2017  . MDD (major depressive disorder), severe ([place]) [F32.2] 03/30/2017  . MDD (major depressive disorder), recurrent severe, without psychosis ([place]) [F33.2] 03/30/2017   Past Psychiatric History: Bipolar 1 disorder, Psychosis.   Hospital Course: (Per Md's SRA): Janet Welch is a 42 y/o F with history of treatment for depression and previous episode concerning for manic symptoms in context of head injury at age 85 who presentedvoluntarily brought in by family with worsening insomnia, disorganization, paranoia, and AH for the past several weeks. Pt was started on olanzapine while in the ED  and transferred to [facility] for additional treatment and evaluation. Pt was continued on olanzapine, and she reported incremental improvement of her presenting symptoms.Dose of olanzapine was reduced due to daytime sedation, but pt continued to have improvement of presenting symptoms. She also was medicated & discharged on Hydroxyzine prn for anxiety.  Today upon her discharge evaluation with the attending pschiatrist, pt shares, "I'm doing good, I feel a little groggy. I'm ready to go home though." She denies SI/HI/AH/VH. She notes that she had a nap yesterday afternoon and then took her evening medications, and she slept until about 3AM at which time she woke up and took a vistaril, and now she is feeling groggy this morning. Discussed with patient that her medications can cause some AM grogginess, and we will reduce olanzapine to 5mg  at bedtime. 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 Discussed with patient about possibly transitioning to a different medication for mood stabilization at her first follow up appointment, and pt agreed to discuss that option with her outpatient provider.She was able to engage in safety planning including plan to return to [facility] or contact emergency services if she feels unable to maintain her own safety or the safety of others. Pt had no further questions, comments, or concerns.  Upon discharge, Jaleya was both mentally & medically stable. She will continue further mental health care on an outpatient basis as noted below. She is provided with all the necessary information needed to make this appointment without problems. She left [facility] with all personal belongings in no apparent distress. Transportation per husband.      TAKE these medications     Indication    hydrOXYzine 50 MG tablet Commonly known as:  ATARAX/VISTARIL Take 1 tablet (50 mg total) by mouth every 8 (  eight) hours as needed for anxiety (insomnia).  Indication:  Feeling Anxious, Insomnai   OLANZapine 5 MG tablet Commonly known as:  ZYPREXA Take 1 tablet (5 mg total) by mouth at bedtime. For mood control  Indication:  Mood control         Follow-up [facility] Follow up on 04/17/2017.   Why:  Friday at [facility] with Diona Foley for your hospital follow up appointment Contact information: [place] [address] [phone]    ------------------------------------------------------------------------------------------------------------------------------------------------------------------------------------------------------------------------------------------------------------------------------------    TODAY---04/29/2017: She reports that on the date of that follow-up visit --- they had a death in the family and had to be out of town on that date. She also states that she knew of someone else that she wanted to see for follow-up. Reports that she has appointment scheduled with them on April 29. States that she thinks her name is Janet Welch and that she knows that they are located at [place]. States that she only has 2 pills remaining of her Olanzapine. She is needing a refill on that to hold her over until her follow-up appointment on April 29. She reports that she has felt stable since her discharge from the hospital.  Feels like this medicine is working for her and is causing no adverse effects.  Feels that her mood is stable.  Says that she has been able to get good sleep with no further insomnia.      Past Medical History:  Diagnosis Date  . No pertinent past medical history      Home Meds: Outpatient Medications Prior to Visit  Medication Sig Dispense Refill  . hydrOXYzine (ATARAX/VISTARIL) 50  MG tablet Take 1 tablet (50 mg total) by mouth every 8 (eight) hours as needed for anxiety (insomnia). 75 tablet 0  . OLANZapine (ZYPREXA) 5 MG tablet Take 1 tablet (5 mg total) by mouth at bedtime. For mood control 30 tablet 0   No facility-administered medications prior to visit.     Allergies: No Known Allergies  Social History   Socioeconomic History  . Marital status: Married    Spouse name: Not on file  . Number of children: Not on file  . Years of education: Not on file  . Highest education level: Not on file  Occupational History  . Not on file  Social Needs  . Financial resource strain: Not on file  . Food insecurity:    Worry: Not on file    Inability: Not on file  . Transportation needs:    Medical: Not on file    Non-medical: Not on file  Tobacco Use  . Smoking status: Never Smoker  . Smokeless tobacco: Never Used  Substance and Sexual Activity  . Alcohol use: No  . Drug use: No  . Sexual activity: Yes  Lifestyle  . Physical activity:    Days per week: Not on file    Minutes per session: Not on file  . Stress: Not on file  Relationships  . Social connections:    Talks on phone: Not on file    Gets together: Not on file    Attends religious service: Not on file    Active member of club or organization: Not on file    Attends meetings of clubs or organizations: Not on file    Relationship status: Not on file  . Intimate partner violence:    Fear of current or ex partner: Not on file  Emotionally abused: Not on file    Physically abused: Not on file    Forced sexual activity: Not on file  Other Topics Concern  . Not on file  Social History Narrative  . Not on file    Family History  Problem Relation Age of Onset  . Thyroid disease Mother   . Parkinson's disease Father      Review of Systems:  See HPI for pertinent ROS. All other ROS negative.    Physical Exam: Blood pressure 118/82, pulse 67, temperature 97.7 F (36.5 C), temperature  source Oral, resp. rate 16, height 5\' 7"  (1.702 m), weight 74.8 kg (164 lb 12.8 oz), last menstrual period 04/24/2017, SpO2 99 %., Body mass index is 25.81 kg/m. General: WNWD WF. Appears in no acute distress. Neck: Supple. No thyromegaly. No lymphadenopathy. Lungs: Clear bilaterally to auscultation without wheezes, rales, or rhonchi. Breathing is unlabored. Heart: RRR with S1 S2. No murmurs, rubs, or gallops. Musculoskeletal:  Strength and tone normal for age. Extremities/Skin: Warm and dry.  Neuro: Alert and oriented X 3. Moves all extremities spontaneously. Gait is normal. CNII-XII grossly in tact. Psych:  Responds to questions appropriately with a normal affect.     ASSESSMENT AND PLAN:  42 y.o. year old female with   1. Bipolar I disorder, current or most recent episode manic, severe with mood-congruent psychotic features ([place]) Her affect seems totally appropriate during the visit today.  She reports that she has been feeling that her mood has been stable and that she has been getting good sleep with no further insomnia.  Today  I have refilled her Zyprexa for #30+0 refills.  This will hold her over until her follow-up at [facility] psychiatric scheduled for April 29.  She will follow-up sooner if needed.  2. MDD (major depressive disorder), recurrent severe, without psychosis ([place]) Her affect seems totally appropriate during the visit today.  She reports that she has been feeling that her mood has been stable and that she has been getting good sleep with no further insomnia.  Today  I have refilled her Zyprexa for #30+0 refills.  This will hold her over until her follow-up at [facility] psychiatric scheduled for April 29.  She will follow-up sooner if needed.   3. MDD (major depressive disorder), severe ([place]) Her affect seems totally appropriate during the visit today.  She reports that she has been feeling that her mood has been stable and that she has been getting good sleep with no  further insomnia.  Today  I have refilled her Zyprexa for #30+0 refills.  This will hold her over until her follow-up at [facility] psychiatric scheduled for April 29.  She will follow-up sooner if needed.   [address] [place], [place], [facility] 04/29/2017 12:22 PM

## 2017-05-06 DIAGNOSIS — F3130 Bipolar disorder, current episode depressed, mild or moderate severity, unspecified: Secondary | ICD-10-CM | POA: Diagnosis not present

## 2017-05-12 DIAGNOSIS — H6981 Other specified disorders of Eustachian tube, right ear: Secondary | ICD-10-CM | POA: Diagnosis not present

## 2017-05-12 DIAGNOSIS — Z9622 Myringotomy tube(s) status: Secondary | ICD-10-CM | POA: Diagnosis not present

## 2017-05-13 DIAGNOSIS — F3130 Bipolar disorder, current episode depressed, mild or moderate severity, unspecified: Secondary | ICD-10-CM | POA: Diagnosis not present

## 2017-05-15 ENCOUNTER — Ambulatory Visit (HOSPITAL_COMMUNITY): Payer: Self-pay | Admitting: Psychiatry

## 2017-05-18 DIAGNOSIS — F312 Bipolar disorder, current episode manic severe with psychotic features: Secondary | ICD-10-CM | POA: Diagnosis not present

## 2017-05-25 DIAGNOSIS — F3130 Bipolar disorder, current episode depressed, mild or moderate severity, unspecified: Secondary | ICD-10-CM | POA: Diagnosis not present

## 2017-05-28 DIAGNOSIS — F3130 Bipolar disorder, current episode depressed, mild or moderate severity, unspecified: Secondary | ICD-10-CM | POA: Diagnosis not present

## 2017-06-03 DIAGNOSIS — F3130 Bipolar disorder, current episode depressed, mild or moderate severity, unspecified: Secondary | ICD-10-CM | POA: Diagnosis not present

## 2017-06-16 DIAGNOSIS — F3130 Bipolar disorder, current episode depressed, mild or moderate severity, unspecified: Secondary | ICD-10-CM | POA: Diagnosis not present

## 2017-06-22 DIAGNOSIS — F3130 Bipolar disorder, current episode depressed, mild or moderate severity, unspecified: Secondary | ICD-10-CM | POA: Diagnosis not present

## 2017-07-06 DIAGNOSIS — F3130 Bipolar disorder, current episode depressed, mild or moderate severity, unspecified: Secondary | ICD-10-CM | POA: Diagnosis not present

## 2017-07-13 DIAGNOSIS — F3130 Bipolar disorder, current episode depressed, mild or moderate severity, unspecified: Secondary | ICD-10-CM | POA: Diagnosis not present

## 2017-07-24 ENCOUNTER — Other Ambulatory Visit: Payer: Self-pay

## 2017-07-24 DIAGNOSIS — I83893 Varicose veins of bilateral lower extremities with other complications: Secondary | ICD-10-CM

## 2017-07-27 DIAGNOSIS — F3130 Bipolar disorder, current episode depressed, mild or moderate severity, unspecified: Secondary | ICD-10-CM | POA: Diagnosis not present

## 2017-08-07 ENCOUNTER — Encounter: Payer: Self-pay | Admitting: Vascular Surgery

## 2017-08-07 ENCOUNTER — Ambulatory Visit (HOSPITAL_COMMUNITY)
Admission: RE | Admit: 2017-08-07 | Discharge: 2017-08-07 | Disposition: A | Discharge status: Home / Self Care | Payer: BLUE CROSS/BLUE SHIELD | Source: Ambulatory Visit | Attending: Vascular Surgery | Admitting: Vascular Surgery

## 2017-08-07 ENCOUNTER — Other Ambulatory Visit: Payer: Self-pay

## 2017-08-07 ENCOUNTER — Ambulatory Visit (INDEPENDENT_AMBULATORY_CARE_PROVIDER_SITE_OTHER): Payer: BLUE CROSS/BLUE SHIELD | Admitting: Vascular Surgery

## 2017-08-07 VITALS — BP 108/67 | HR 68 | Temp 98.5°F | Resp 16 | Ht 67.0 in | Wt 189.0 lb

## 2017-08-07 DIAGNOSIS — I83893 Varicose veins of bilateral lower extremities with other complications: Secondary | ICD-10-CM

## 2017-08-07 DIAGNOSIS — R609 Edema, unspecified: Secondary | ICD-10-CM | POA: Diagnosis not present

## 2017-08-07 NOTE — Progress Notes (Signed)
Patient ID: Janet Welch, female   DOB: Jan 27, 1975, 42 y.o.   MRN: [id number]  Reason for Consult: New Patient (Initial Visit) (V V.  Occasional bilat LE pain.  L > R. )   Referred by Orlena Sheldon, PA-C  Subjective:     HPI:  Janet Welch is a 42 y.o. female self referred for bilateral lower extremity varicose veins.  States that she has had several children and they got worse during that time.  Unfortunately do not resolve after having children.  Her father had varicose veins.  They do cause her some discomfort specifically in her left leg.  She is never had any bleeding.  She denies any history of blood clots.  She had reflux study performed prior to today's visit.  Past Medical History:  Diagnosis Date  . No pertinent past medical history    Family History  Problem Relation Age of Onset  . Thyroid disease Mother   . Parkinson's disease Father    Past Surgical History:  Procedure Laterality Date  . CESAREAN SECTION N/A 07/07/2013   Procedure: CESAREAN SECTION;  Surgeon: Linda Hedges, DO;  Location: [facility];  Service: Obstetrics;  Laterality: N/A;  . LEG SKIN LESION  BIOPSY / EXCISION    . scar tissue biopsy      Short Social History:  Social History   Tobacco Use  . Smoking status: Never Smoker  . Smokeless tobacco: Never Used  Substance Use Topics  . Alcohol use: No    No Known Allergies  Current Outpatient Medications  Medication Sig Dispense Refill  . OLANZapine (ZYPREXA) 5 MG tablet Take 1 tablet (5 mg total) by mouth at bedtime. For mood control 30 tablet 0   No current facility-administered medications for this visit.     Review of Systems  Constitutional:  Constitutional negative. HENT: HENT negative.  Eyes: Eyes negative.  Respiratory: Respiratory negative.  Cardiovascular: Cardiovascular negative.  GI: Gastrointestinal negative.  Musculoskeletal: Musculoskeletal negative.  Skin: Skin negative.  Neurological: Neurological negative. Hematologic:  Hematologic/lymphatic negative.  Psychiatric: Psychiatric negative.        Objective:  Objective   Vitals:   08/07/17 1433  BP: 108/67  Pulse: 68  Resp: 16  Temp: 98.5 F (36.9 C)  TempSrc: Oral  SpO2: 100%  Weight: 189 lb (85.7 kg)  Height: 5\' 7"  (1.702 m)   Body mass index is 29.6 kg/m.  Physical Exam  Constitutional: She appears well-developed.  HENT:  Head: Normocephalic.  Eyes: Pupils are equal, round, and reactive to light.  Neck: Normal range of motion.  Cardiovascular: Normal rate.  Pulmonary/Chest: Effort normal.  Abdominal: Soft.  Musculoskeletal: Normal range of motion.  Neurological: She is alert.  Skin: Skin is warm. Capillary refill takes less than 2 seconds.  Psychiatric: She has a normal mood and affect. Her behavior is normal. Judgment and thought content normal.        Data: I have independently interpreted her reflux study which demonstrates reflux in her great saphenous vein on the right 666 ms as well as her small saphenous vein 1049 ms and on the left GSV 777 ms  Diameter on the right is 0.7 cm at the junction and 0.7 on the left at the junction     Assessment/Plan:     42 year old female with bilateral lower extremity varicose veins and spider veins with C2 venous disease.  Looking at her venous reflux studies she does not have significantly sized saphenous veins for intervention  at this time.  I discussed with her the possibility of sclerotherapy and she will call to discuss next week.     Janet Welch [facility]

## 2017-08-17 DIAGNOSIS — F3130 Bipolar disorder, current episode depressed, mild or moderate severity, unspecified: Secondary | ICD-10-CM | POA: Diagnosis not present

## 2017-08-31 DIAGNOSIS — F3130 Bipolar disorder, current episode depressed, mild or moderate severity, unspecified: Secondary | ICD-10-CM | POA: Diagnosis not present

## 2017-09-14 DIAGNOSIS — F3130 Bipolar disorder, current episode depressed, mild or moderate severity, unspecified: Secondary | ICD-10-CM | POA: Diagnosis not present

## 2017-10-01 DIAGNOSIS — F3130 Bipolar disorder, current episode depressed, mild or moderate severity, unspecified: Secondary | ICD-10-CM | POA: Diagnosis not present

## 2017-10-08 DIAGNOSIS — F3130 Bipolar disorder, current episode depressed, mild or moderate severity, unspecified: Secondary | ICD-10-CM | POA: Diagnosis not present

## 2017-10-15 DIAGNOSIS — F3130 Bipolar disorder, current episode depressed, mild or moderate severity, unspecified: Secondary | ICD-10-CM | POA: Diagnosis not present

## 2017-11-05 DIAGNOSIS — F3130 Bipolar disorder, current episode depressed, mild or moderate severity, unspecified: Secondary | ICD-10-CM | POA: Diagnosis not present

## 2017-11-05 DIAGNOSIS — F319 Bipolar disorder, unspecified: Secondary | ICD-10-CM | POA: Diagnosis not present

## 2017-11-12 ENCOUNTER — Ambulatory Visit: Payer: No Typology Code available for payment source | Admitting: Physician Assistant

## 2017-11-12 DIAGNOSIS — F319 Bipolar disorder, unspecified: Secondary | ICD-10-CM | POA: Diagnosis not present

## 2017-11-19 DIAGNOSIS — F3130 Bipolar disorder, current episode depressed, mild or moderate severity, unspecified: Secondary | ICD-10-CM | POA: Diagnosis not present

## 2017-11-26 DIAGNOSIS — F319 Bipolar disorder, unspecified: Secondary | ICD-10-CM | POA: Diagnosis not present

## 2017-12-03 DIAGNOSIS — F3130 Bipolar disorder, current episode depressed, mild or moderate severity, unspecified: Secondary | ICD-10-CM | POA: Diagnosis not present

## 2017-12-22 ENCOUNTER — Ambulatory Visit: Payer: No Typology Code available for payment source | Admitting: Physician Assistant

## 2017-12-22 ENCOUNTER — Encounter

## 2017-12-24 DIAGNOSIS — F3130 Bipolar disorder, current episode depressed, mild or moderate severity, unspecified: Secondary | ICD-10-CM | POA: Diagnosis not present

## 2017-12-31 DIAGNOSIS — F3130 Bipolar disorder, current episode depressed, mild or moderate severity, unspecified: Secondary | ICD-10-CM | POA: Diagnosis not present

## 2018-01-07 DIAGNOSIS — F3130 Bipolar disorder, current episode depressed, mild or moderate severity, unspecified: Secondary | ICD-10-CM | POA: Diagnosis not present

## 2018-02-02 DIAGNOSIS — Z01419 Encounter for gynecological examination (general) (routine) without abnormal findings: Secondary | ICD-10-CM | POA: Diagnosis not present

## 2018-02-02 DIAGNOSIS — Z6828 Body mass index (BMI) 28.0-28.9, adult: Secondary | ICD-10-CM | POA: Diagnosis not present

## 2018-06-17 ENCOUNTER — Other Ambulatory Visit: Payer: Self-pay | Admitting: Physician Assistant

## 2018-06-18 NOTE — Telephone Encounter (Signed)
Need to contact pt to see if still continuing care with provider, no visits in epic

## 2018-06-21 NOTE — Telephone Encounter (Signed)
Can you check to see if she's continuing care with Helene Kelp, it's been awhile since she's been in.

## 2018-06-29 ENCOUNTER — Other Ambulatory Visit: Payer: Self-pay

## 2018-06-29 ENCOUNTER — Ambulatory Visit (INDEPENDENT_AMBULATORY_CARE_PROVIDER_SITE_OTHER): Payer: BC Managed Care – PPO | Admitting: Physician Assistant

## 2018-06-29 ENCOUNTER — Encounter: Payer: Self-pay | Admitting: Physician Assistant

## 2018-06-29 DIAGNOSIS — G4700 Insomnia, unspecified: Secondary | ICD-10-CM | POA: Diagnosis not present

## 2018-06-29 DIAGNOSIS — F39 Unspecified mood [affective] disorder: Secondary | ICD-10-CM

## 2018-06-29 DIAGNOSIS — Z79899 Other long term (current) drug therapy: Secondary | ICD-10-CM

## 2018-06-29 MED ORDER — OLANZAPINE 5 MG PO TABS: 5.0000 mg | ORAL_TABLET | Freq: Every day | ORAL | 1 refills | Status: DC

## 2018-06-29 NOTE — Progress Notes (Signed)
[facility]  Patient ID: Janet Welch,  MRN: [id number]  PCP: Janet Welch  Date of Evaluation: 06/29/2018 Time spent:25 minutes  Chief Complaint:  Chief Complaint    Medication Refill      HISTORY/CURRENT STATUS: HPI For med RF.   Was seen by me on 05/18/17.  She had to r/s several times.  Went off the Zyprexa for 'a long time,' but started back on it about 3 weeks ago. Had started feeling depressed, not wanting to do things, felt overwhelmed, had low energy and motivation.  Had been isolating due to the coronavirus pandemic but only because of that.  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  She does feel better since she started back on the Zyprexa.  Denies audio or visual hallucinations.  Denies suicidal or homicidal thoughts.    Denies cyclical mood changes since her last visit.  States that when she exercises daily and eats healthy, she feels better.  Reports that things are good at home with her and her husband.  They are getting along well, the kids are fine.  There are no triggers at home or through their business that could be affecting her mental health at this point.  States that everything that is going on in the world probably makes everybody a little nervous, referring to the coronavirus as well as the rioting.  Denies having panic attacks.  She does have anxiety that comes and goes throughout the day and states its more of obsessing about things that need to be done.  She cannot get things off her mind sometimes.  Since going back on the Zyprexa that has decreased.  It still happens though.  She has had some trouble sleeping.  Mostly trouble falling asleep.  Taking the Zyprexa has helped that as well.  She also takes Benadryl with it and that helps.  She always feels better the next day after she slept really well.  Patient denies  increased energy with decreased need for sleep, no increased talkativeness, no racing thoughts, no impulsivity or risky behaviors, no increased spending, no increased libido, no grandiosity.  Denies dizziness, syncope, seizures, numbness, tingling, tremor, tics, unsteady gait, slurred speech, confusion. Denies muscle or joint pain, stiffness, or dystonia.  Note: Patient's husband was in the waiting room with her, he refused to wear a mask. Due to the coronavirus pandemic, that is a requirement at all [facility] facilities.  He also refused to leave.  911 was called but he did leave before they arrived.    Individual Medical History/ Review of Systems: Changes? :No , Not since last visit. Of importance is admission to [facility] behavioral health 03/30/2017 through 04/03/2017.  She was diagnosed with bipolar mania with psychotic features.  When she was started on Zyprexa 5 mg and prescribed hydroxyzine for anxiety and insomnia.  Past medications for mental health diagnoses include: Ambien caused nightmares  Allergies: Patient has no known allergies.  Current Medications:  Current Outpatient Medications:  .  diphenhydrAMINE (BENADRYL) 25 MG tablet, Take 25-50 mg by mouth at bedtime as needed., Disp: , Rfl:  .  OLANZapine (ZYPREXA) 5 MG tablet, Take 1 tablet (5 mg total) by mouth at bedtime. For mood control, Disp: 30 tablet, Rfl: 0 .  atomoxetine (STRATTERA) 80 MG capsule, Take 80 mg by mouth every morning., Disp: , Rfl:  .  fluvoxaMINE (LUVOX) 100 MG tablet, Take 100 mg by mouth at bedtime. Take 2 (200 mg) at bedtime, Disp: , Rfl:  .  hydrOXYzine (VISTARIL) 50 MG capsule, Take 50 mg by mouth at bedtime. 1/2 tab at bedtime, Disp: , Rfl:  .  naltrexone (DEPADE) 50 MG tablet, Take 50 mg by mouth every morning., Disp: , Rfl:  .  OLANZapine (ZYPREXA) 5 MG tablet, Take 1 tablet (5 mg total) by mouth at bedtime., Disp: 30 tablet, Rfl: 1 Medication Side Effects: none  Family Medical/ Social History:  Changes? No changes since she was seen 05/18/2017.  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   MENTAL HEALTH EXAM:  There were no vitals taken for this visit.There is no height or weight on file to calculate BMI.  General Appearance: Casual  Eye Contact:  Good  Speech:  Clear and Coherent  Volume:  Normal  Mood:  Anxious  Affect:  Appropriate  Thought Process:  Goal Directed  Orientation:  Full (Time, Place, and Person)  Thought Content: Logical   Suicidal Thoughts:  No  Homicidal Thoughts:  No  Memory:  WNL  Judgement:  Good  Insight:  Good  Psychomotor Activity:  Normal  Concentration:  Concentration: Good and Attention Span: Good  Recall:  Good  Fund of Knowledge: Good  Language: Good  Assets:  Desire for Improvement  ADL's:  Intact  Cognition: WNL  Prognosis:  Good    DIAGNOSES:    ICD-10-CM   1. Episodic mood disorder (HCC) F39   2. Encounter for long-term (current) use of medications Z79.899 Hemoglobin A1c    Comprehensive metabolic panel    Lipid panel  3. Insomnia, unspecified type G47.00     Receiving Psychotherapy: No She was seeing [facility] but has not been in a while.  Felt like she has not needed it.   RECOMMENDATIONS: I spent 25 minutes with her and 50% of that time was in counseling concerning her diagnosis and treatment options.   Continue Zyprexa 5 mg nightly.  She is already starting to feel better on this. Continue Benadryl as needed sleep.  She understands that may quit working after a while and if so, call and I will send in a prescription for trazodone. Discussed sleep hygiene.  Continue exercise and healthy eating. Consider restarting counseling.  States she would like to see how well the medication works and then will think about going back if needed. Labs ordered as above, to be done within the next 2 to 3 weeks. Return in 6 weeks.  Janet Moat, PA-C   This record has been  created using [employer].  Chart creation errors have been sought, but may not always have been located and corrected. Such creation errors do not reflect on the standard of medical care.

## 2018-07-01 DIAGNOSIS — D2361 Other benign neoplasm of skin of right upper limb, including shoulder: Secondary | ICD-10-CM | POA: Diagnosis not present

## 2018-07-01 DIAGNOSIS — D2261 Melanocytic nevi of right upper limb, including shoulder: Secondary | ICD-10-CM | POA: Diagnosis not present

## 2018-07-01 DIAGNOSIS — D225 Melanocytic nevi of trunk: Secondary | ICD-10-CM | POA: Diagnosis not present

## 2018-07-01 DIAGNOSIS — D2262 Melanocytic nevi of left upper limb, including shoulder: Secondary | ICD-10-CM | POA: Diagnosis not present

## 2018-07-09 ENCOUNTER — Encounter: Payer: Self-pay | Admitting: Family Medicine

## 2018-07-14 ENCOUNTER — Ambulatory Visit (INDEPENDENT_AMBULATORY_CARE_PROVIDER_SITE_OTHER): Payer: BC Managed Care – PPO | Admitting: Family Medicine

## 2018-07-14 ENCOUNTER — Encounter: Payer: Self-pay | Admitting: Family Medicine

## 2018-07-14 ENCOUNTER — Other Ambulatory Visit: Payer: Self-pay

## 2018-07-14 VITALS — BP 118/70 | HR 64 | Temp 98.3°F | Resp 18 | Ht 67.5 in | Wt 184.8 lb

## 2018-07-14 DIAGNOSIS — Z1239 Encounter for other screening for malignant neoplasm of breast: Secondary | ICD-10-CM

## 2018-07-14 DIAGNOSIS — R739 Hyperglycemia, unspecified: Secondary | ICD-10-CM | POA: Diagnosis not present

## 2018-07-14 DIAGNOSIS — Z0000 Encounter for general adult medical examination without abnormal findings: Secondary | ICD-10-CM

## 2018-07-14 DIAGNOSIS — R946 Abnormal results of thyroid function studies: Secondary | ICD-10-CM | POA: Diagnosis not present

## 2018-07-14 NOTE — Progress Notes (Signed)
Patient: Janet Welch, Female    DOB: 1975-02-15, 43 y.o.   MRN: [id number] Visit Date: 07/20/2018  Today's Provider: Delsa Grana, PA-C   Chief Complaint  Patient presents with  . Annual Exam   Subjective:    Annual physical exam Janet Welch is a 43 y.o. female who presents today for health maintenance and complete physical. She feels well. She reports exercising. She reports she is sleeping well. -----------------------------------------------------------------  Menses - OBGYN, C-section and tubes tied  Dr. Matthew Saras - [facility] - does PAP and mammograms Energy levels feel different, feels more tired 5-8 lbs weight loss and she's been trying  MDD/Bipolar 1? - zyprexa, she took it for 5 months or so, then was off for a period of time, restarted taking in March.  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  Has a psychiatrist - seeing a PA Donnal Moat, [address].   Review of Systems  Constitutional: Negative.   HENT: Negative.   Eyes: Negative.   Respiratory: Negative.   Cardiovascular: Negative.   Gastrointestinal: Negative.   Endocrine: Negative.   Genitourinary: Negative.   Musculoskeletal: Negative.   Skin: Negative.   Allergic/Immunologic: Negative.   Neurological: Negative.   Hematological: Negative.   Psychiatric/Behavioral: Negative.   All other systems reviewed and are negative.    Social History      She  reports that she has never smoked. She has never used smokeless tobacco. She reports current alcohol use. She reports that she does not use drugs.       Social History   Socioeconomic History  . Marital status: Married    Spouse name: Not on file  . Number of children: Not on file  . Years of education: Not on file  . Highest education level: Not on file  Occupational History  . Not on file  Social Needs  .  Financial resource strain: Not on file  . Food insecurity    Worry: Not on file    Inability: Not on file  . Transportation needs    Medical: Not on file    Non-medical: Not on file  Tobacco Use  . Smoking status: Never Smoker  . Smokeless tobacco: Never Used  Substance and Sexual Activity  . Alcohol use: Yes    Alcohol/week: 0.0 - 1.0 standard drinks  . Drug use: No  . Sexual activity: Yes  Lifestyle  . Physical activity    Days per week: Not on file    Minutes per session: Not on file  . Stress: Not on file  Relationships  . Social Herbalist on phone: Not on file    Gets together: Not on file    Attends religious service: Not on file    Active member of club or organization: Not on file    Attends meetings of clubs or organizations: Not on file    Relationship status: Not on file  Other Topics Concern  . Not on file  Social History Narrative  . Not on file    Past Medical History:  Diagnosis Date  . No pertinent past medical history      Patient Active Problem List   Diagnosis Date Noted  . Bipolar I disorder, current or most recent episode manic, severe with mood-congruent psychotic features ([place]) 04/01/2017  . MDD (major  depressive disorder), severe ([place]) 03/30/2017  . MDD (major depressive disorder), recurrent severe, without psychosis ([place]) 03/30/2017    Past Surgical History:  Procedure Laterality Date  . CESAREAN SECTION N/A 07/07/2013   Procedure: CESAREAN SECTION;  Surgeon: Linda Hedges, DO;  Location: [facility];  Service: Obstetrics;  Laterality: N/A;  . LEG SKIN LESION  BIOPSY / EXCISION    . scar tissue biopsy      Family History        Family Status  Relation Name Status  . Mother  (Not Specified)  . Father  (Not Specified)        Her family history includes Parkinson's disease in her father; Thyroid disease in her mother.      No Known Allergies   Current Outpatient Medications:  .  diphenhydrAMINE (BENADRYL) 25 MG tablet,  Take 25-50 mg by mouth at bedtime as needed., Disp: , Rfl:  .  OLANZapine (ZYPREXA) 5 MG tablet, Take 1 tablet (5 mg total) by mouth at bedtime., Disp: 30 tablet, Rfl: 1   Patient Care Team: Delsa Grana, PA-C as PCP - General (Family Medicine)      Objective:   Vitals: BP 118/70   Pulse 64   Temp 98.3 F (36.8 C)   Resp 18   Ht 5' 7.5" (1.715 m)   Wt 184 lb 12.8 oz (83.8 kg)   SpO2 96%   BMI 28.52 kg/m    Vitals:   07/14/18 0836  BP: 118/70  Pulse: 64  Resp: 18  Temp: 98.3 F (36.8 C)  SpO2: 96%  Weight: 184 lb 12.8 oz (83.8 kg)  Height: 5' 7.5" (1.715 m)     Physical Exam Constitutional:      General: She is not in acute distress.    Appearance: Normal appearance. She is well-developed and normal weight. She is not ill-appearing, toxic-appearing or diaphoretic.  HENT:     Head: Normocephalic and atraumatic.     Right Ear: External ear normal.     Left Ear: External ear normal.     Nose: Nose normal.     Mouth/Throat:     Mouth: Mucous membranes are moist.     Pharynx: Oropharynx is clear. Uvula midline. No oropharyngeal exudate.  Eyes:     General: Lids are normal. No scleral icterus.       Right eye: No discharge.        Left eye: No discharge.     Conjunctiva/sclera: Conjunctivae normal.     Pupils: Pupils are equal, round, and reactive to light.  Neck:     Musculoskeletal: Normal range of motion and neck supple.     Thyroid: No thyroid mass, thyromegaly or thyroid tenderness.     Trachea: Phonation normal. No tracheal deviation.  Cardiovascular:     Rate and Rhythm: Normal rate and regular rhythm.     Pulses: Normal pulses.          Radial pulses are 2+ on the right side and 2+ on the left side.       Posterior tibial pulses are 2+ on the right side and 2+ on the left side.     Heart sounds: Normal heart sounds. No murmur. No friction rub. No gallop.   Pulmonary:     Effort: Pulmonary effort is normal. No respiratory distress.     Breath sounds:  Normal breath sounds. No stridor. No wheezing, rhonchi or rales.  Chest:     Chest wall: No tenderness.  Abdominal:  General: Bowel sounds are normal. There is no distension.     Palpations: Abdomen is soft.     Tenderness: There is no abdominal tenderness. There is no guarding or rebound.  Musculoskeletal: Normal range of motion.        General: No deformity.  Lymphadenopathy:     Cervical: No cervical adenopathy.  Skin:    General: Skin is warm and dry.     Capillary Refill: Capillary refill takes less than 2 seconds.     Coloration: Skin is not jaundiced or pale.     Findings: No rash.  Neurological:     Mental Status: She is alert and oriented to person, place, and time.     Motor: No weakness or abnormal muscle tone.     Coordination: Coordination normal.     Gait: Gait normal.  Psychiatric:        Mood and Affect: Mood normal.        Speech: Speech normal.        Behavior: Behavior normal.        Thought Content: Thought content normal.        Judgment: Judgment normal.      Depression Screen PHQ 2/9 Scores 07/14/2018 03/25/2017 01/19/2017 06/06/2016  PHQ - 2 Score 0 0 0 0  PHQ- 9 Score - - - 0     Office Visit from 07/14/2018 in [facility]  AUDIT-C Score  2         Assessment & Plan:     Routine Health Maintenance and Physical Exam  Exercise Activities and Dietary recommendations: AHA recommendations given, pt currently very healthy and active  Discussed health benefits of physical activity, and encouraged her to engage in regular exercise appropriate for her age and condition.   There is no immunization history for the selected administration types on file for this patient.  Health Maintenance  Topic Date Due  . PAP SMEAR-Modifier  08/22/2017  . INFLUENZA VACCINE  08/21/2018  . TETANUS/TDAP  04/12/2023  . HIV Screening  Completed    PAP and mammogram per OBGYN - but extra mammogram order put in - in case she cannot get into see  OBGYN, she defers anything else here today.  Depression and alcohol screening negative.  Other psychiatric hx and meds, managed by [facility].  Does tend to go off and on her meds, main sx are insomnia and then she crashes, right now doing really well.  Screening labs for annual exam including thyroid test with hx of thyroid disease in her mother.  No other immunizations or screening due.  Return in one year or sooner as needed.     ICD-10-CM   1. Routine general medical examination at a health care facility  Z00.00 CBC with Differential/Platelet    COMPLETE METABOLIC PANEL WITH GFR    Hemoglobin A1c    Lipid panel    TSH    T3, free    T4, free  2. Screening for malignant neoplasm of breast  Z12.39 CANCELED: MM Digital Screening    CANCELED: MM DIAG BREAST TOMO BILATERAL    Delsa Grana, PA-C 07/14/18 8:56 AM  [facility]

## 2018-07-14 NOTE — Patient Instructions (Signed)
Health Maintenance, Female Adopting a healthy lifestyle and getting preventive care can go a long way to promote health and wellness. Talk with your health care provider about what schedule of regular examinations is right for you. This is a good chance for you to check in with your provider about disease prevention and staying healthy. In between checkups, there are plenty of things you can do on your own. Experts have done a lot of research about which lifestyle changes and preventive measures are most likely to keep you healthy. Ask your health care provider for more information. Weight and diet Eat a healthy diet  Be sure to include plenty of vegetables, fruits, low-fat dairy products, and lean protein.  Do not eat a lot of foods high in solid fats, added sugars, or salt.  Get regular exercise. This is one of the most important things you can do for your health. ? Most adults should exercise for at least 150 minutes each week. The exercise should increase your heart rate and make you sweat (moderate-intensity exercise). ? Most adults should also do strengthening exercises at least twice a week. This is in addition to the moderate-intensity exercise. Maintain a healthy weight  Body mass index (BMI) is a measurement that can be used to identify possible weight problems. It estimates body fat based on height and weight. Your health care provider can help determine your BMI and help you achieve or maintain a healthy weight.  For females 5 years of age and older: ? A BMI below 18.5 is considered underweight. ? A BMI of 18.5 to 24.9 is normal. ? A BMI of 25 to 29.9 is considered overweight. ? A BMI of 30 and above is considered obese. Watch levels of cholesterol and blood lipids  You should start having your blood tested for lipids and cholesterol at 42 years of age, then have this test every 5 years.  You may need to have your cholesterol levels checked more often if: ? Your lipid or  cholesterol levels are high. ? You are older than 42 years of age. ? You are at high risk for heart disease. Cancer screening Lung Cancer  Lung cancer screening is recommended for adults 48-79 years old who are at high risk for lung cancer because of a history of smoking.  A yearly low-dose CT scan of the lungs is recommended for people who: ? Currently smoke. ? Have quit within the past 15 years. ? Have at least a 30-pack-year history of smoking. A pack year is smoking an average of one pack of cigarettes a day for 1 year.  Yearly screening should continue until it has been 15 years since you quit.  Yearly screening should stop if you develop a health problem that would prevent you from having lung cancer treatment. Breast Cancer  Practice breast self-awareness. This means understanding how your breasts normally appear and feel.  It also means doing regular breast self-exams. Let your health care provider know about any changes, no matter how small.  If you are in your 20s or 30s, you should have a clinical breast exam (CBE) by a health care provider every 1-3 years as part of a regular health exam.  If you are 22 or older, have a CBE every year. Also consider having a breast X-ray (mammogram) every year.  If you have a family history of breast cancer, talk to your health care provider about genetic screening.  If you are at high risk for breast cancer, talk  to your health care provider about having an MRI and a mammogram every year.  Breast cancer gene (BRCA) assessment is recommended for women who have family members with BRCA-related cancers. BRCA-related cancers include: ? Breast. ? Ovarian. ? Tubal. ? Peritoneal cancers.  Results of the assessment will determine the need for genetic counseling and BRCA1 and BRCA2 testing. Cervical Cancer Your health care provider may recommend that you be screened regularly for cancer of the pelvic organs (ovaries, uterus, and vagina).  This screening involves a pelvic examination, including checking for microscopic changes to the surface of your cervix (Pap test). You may be encouraged to have this screening done every 3 years, beginning at age 21.  For women ages 30-65, health care providers may recommend pelvic exams and Pap testing every 3 years, or they may recommend the Pap and pelvic exam, combined with testing for human papilloma virus (HPV), every 5 years. Some types of HPV increase your risk of cervical cancer. Testing for HPV may also be done on women of any age with unclear Pap test results.  Other health care providers may not recommend any screening for nonpregnant women who are considered low risk for pelvic cancer and who do not have symptoms. Ask your health care provider if a screening pelvic exam is right for you.  If you have had past treatment for cervical cancer or a condition that could lead to cancer, you need Pap tests and screening for cancer for at least 20 years after your treatment. If Pap tests have been discontinued, your risk factors (such as having a new sexual partner) need to be reassessed to determine if screening should resume. Some women have medical problems that increase the chance of getting cervical cancer. In these cases, your health care provider may recommend more frequent screening and Pap tests. Colorectal Cancer  This type of cancer can be detected and often prevented.  Routine colorectal cancer screening usually begins at 42 years of age and continues through 42 years of age.  Your health care provider may recommend screening at an earlier age if you have risk factors for colon cancer.  Your health care provider may also recommend using home test kits to check for hidden blood in the stool.  A small camera at the end of a tube can be used to examine your colon directly (sigmoidoscopy or colonoscopy). This is done to check for the earliest forms of colorectal cancer.  Routine  screening usually begins at age 50.  Direct examination of the colon should be repeated every 5-10 years through 42 years of age. However, you may need to be screened more often if early forms of precancerous polyps or small growths are found. Skin Cancer  Check your skin from head to toe regularly.  Tell your health care provider about any new moles or changes in moles, especially if there is a change in a mole's shape or color.  Also tell your health care provider if you have a mole that is larger than the size of a pencil eraser.  Always use sunscreen. Apply sunscreen liberally and repeatedly throughout the day.  Protect yourself by wearing long sleeves, pants, a wide-brimmed hat, and sunglasses whenever you are outside. Heart disease, diabetes, and high blood pressure  High blood pressure causes heart disease and increases the risk of stroke. High blood pressure is more likely to develop in: ? People who have blood pressure in the high end of the normal range (130-139/85-89 mm Hg). ? People   who are overweight or obese. ? People who are African American.  If you are 84-22 years of age, have your blood pressure checked every 3-5 years. If you are 67 years of age or older, have your blood pressure checked every year. You should have your blood pressure measured twice-once when you are at a hospital or clinic, and once when you are not at a hospital or clinic. Record the average of the two measurements. To check your blood pressure when you are not at a hospital or clinic, you can use: ? An automated blood pressure machine at a pharmacy. ? A home blood pressure monitor.  If you are between 52 years and 3 years old, ask your health care provider if you should take aspirin to prevent strokes.  Have regular diabetes screenings. This involves taking a blood sample to check your fasting blood sugar level. ? If you are at a normal weight and have a low risk for diabetes, have this test once  every three years after 42 years of age. ? If you are overweight and have a high risk for diabetes, consider being tested at a younger age or more often. Preventing infection Hepatitis B  If you have a higher risk for hepatitis B, you should be screened for this virus. You are considered at high risk for hepatitis B if: ? You were born in a country where hepatitis B is common. Ask your health care provider which countries are considered high risk. ? Your parents were born in a high-risk country, and you have not been immunized against hepatitis B (hepatitis B vaccine). ? You have HIV or AIDS. ? You use needles to inject street drugs. ? You live with someone who has hepatitis B. ? You have had sex with someone who has hepatitis B. ? You get hemodialysis treatment. ? You take certain medicines for conditions, including cancer, organ transplantation, and autoimmune conditions. Hepatitis C  Blood testing is recommended for: ? Everyone born from [birth year] through 1964. ? Anyone with known risk factors for hepatitis C. Sexually transmitted infections (STIs)  You should be screened for sexually transmitted infections (STIs) including gonorrhea and chlamydia if: ? You are sexually active and are younger than 42 years of age. ? You are older than 42 years of age and your health care provider tells you that you are at risk for this type of infection. ? Your sexual activity has changed since you were last screened and you are at an increased risk for chlamydia or gonorrhea. Ask your health care provider if you are at risk.  If you do not have HIV, but are at risk, it may be recommended that you take a prescription medicine daily to prevent HIV infection. This is called pre-exposure prophylaxis (PrEP). You are considered at risk if: ? You are sexually active and do not regularly use condoms or know the HIV status of your partner(s). ? You take drugs by injection. ? You are sexually active with a partner  who has HIV. Talk with your health care provider about whether you are at high risk of being infected with HIV. If you choose to begin PrEP, you should first be tested for HIV. You should then be tested every 3 months for as long as you are taking PrEP. Pregnancy  If you are premenopausal and you may become pregnant, ask your health care provider about preconception counseling.  If you may become pregnant, take 400 to 800 micrograms (mcg) of folic acid every  day.  If you want to prevent pregnancy, talk to your health care provider about birth control (contraception). Osteoporosis and menopause  Osteoporosis is a disease in which the bones lose minerals and strength with aging. This can result in serious bone fractures. Your risk for osteoporosis can be identified using a bone density scan.  If you are 83 years of age or older, or if you are at risk for osteoporosis and fractures, ask your health care provider if you should be screened.  Ask your health care provider whether you should take a calcium or vitamin D supplement to lower your risk for osteoporosis.  Menopause may have certain physical symptoms and risks.  Hormone replacement therapy may reduce some of these symptoms and risks. Talk to your health care provider about whether hormone replacement therapy is right for you. Follow these instructions at home:  Schedule regular health, dental, and eye exams.  Stay current with your immunizations.  Do not use any tobacco products including cigarettes, chewing tobacco, or electronic cigarettes.  If you are pregnant, do not drink alcohol.  If you are breastfeeding, limit how much and how often you drink alcohol.  Limit alcohol intake to no more than 1 drink per day for nonpregnant women. One drink equals 12 ounces of beer, 5 ounces of wine, or 1 ounces of hard liquor.  Do not use street drugs.  Do not share needles.  Ask your health care provider for help if you need support  or information about quitting drugs.  Tell your health care provider if you often feel depressed.  Tell your health care provider if you have ever been abused or do not feel safe at home. This information is not intended to replace advice given to you by your health care provider. Make sure you discuss any questions you have with your health care provider. Document Released: 07/22/2010 Document Revised: 06/14/2015 Document Reviewed: 10/10/2014 Elsevier Interactive Patient Education  2019 [place] [facility]) Exercise Recommendation  Being physically active is important to prevent heart disease and stroke, the nation's No. 1and No. 5killers. To improve overall cardiovascular health, we suggest at least 150 minutes per week of moderate exercise or 75 minutes per week of vigorous exercise (or a combination of moderate and vigorous activity). Thirty minutes a day, five times a week is an easy goal to remember. You will also experience benefits even if you divide your time into two or three segments of 10 to 15 minutes per day.  For people who would benefit from lowering their blood pressure or cholesterol, we recommend 40 minutes of aerobic exercise of moderate to vigorous intensity three to four times a week to lower the risk for heart attack and stroke.  Physical activity is anything that makes you move your body and burn calories.  This includes things like climbing stairs or playing sports. Aerobic exercises benefit your heart, and include walking, jogging, swimming or biking. Strength and stretching exercises are best for overall stamina and flexibility.  The simplest, positive change you can make to effectively improve your heart health is to start walking. It's enjoyable, free, easy, social and great exercise. A walking program is flexible and boasts high success rates because people can stick with it. It's easy for walking to become a regular and satisfying part of  life.   For Overall Cardiovascular Health:  At least 30 minutes of moderate-intensity aerobic activity at least 5 days per week for a total of 150  OR   At least 25 minutes of vigorous aerobic activity at least 3 days per week for a total of 75 minutes; or a combination of moderate- and vigorous-intensity aerobic activity  AND   Moderate- to high-intensity muscle-strengthening activity at least 2 days per week for additional health benefits.  For Lowering Blood Pressure and Cholesterol  An average 40 minutes of moderate- to vigorous-intensity aerobic activity 3 or 4 times per week  What if I can't make it to the time goal? Something is always better than nothing! And everyone has to start somewhere. Even if you've been sedentary for years, today is the day you can begin to make healthy changes in your life. If you don't think you'll make it for 30 or 40 minutes, set a reachable goal for today. You can work up toward your overall goal by increasing your time as you get stronger. Don't let all-or-nothing thinking rob you of doing what you can every day.  Source:http://www.heart.org    

## 2018-07-16 ENCOUNTER — Other Ambulatory Visit: Payer: Self-pay | Admitting: Family Medicine

## 2018-07-16 DIAGNOSIS — R921 Mammographic calcification found on diagnostic imaging of breast: Secondary | ICD-10-CM

## 2018-07-16 LAB — LIPID PANEL
Cholesterol: 162 mg/dL (ref <200)
HDL: 67 mg/dL (ref >=50)
LDL Cholesterol (Calc): 81 mg/dL (calc)
Non-HDL Cholesterol (Calc): 95 mg/dL (calc) (ref <130)
Total CHOL/HDL Ratio: 2.4 (calc) (ref <5.0)
Triglycerides: 49 mg/dL (ref <150)

## 2018-07-16 LAB — HEMOGLOBIN A1C
Hgb A1c MFr Bld: 5.2 % of total Hgb (ref <5.7)
Mean Plasma Glucose: 103 (calc)
eAG (mmol/L): 5.7 (calc)

## 2018-07-16 LAB — CBC WITH DIFFERENTIAL/PLATELET
Absolute Monocytes: 377 cells/uL (ref 200–950)
Basophils Absolute: 51 cells/uL (ref 0–200)
Basophils Relative: 1 %
Eosinophils Absolute: 97 cells/uL (ref 15–500)
Eosinophils Relative: 1.9 %
HCT: 39.4 % (ref 35.0–45.0)
Hemoglobin: 13.1 g/dL (ref 11.7–15.5)
Lymphs Abs: 1627 cells/uL (ref 850–3900)
MCH: 29.2 pg (ref 27.0–33.0)
MCHC: 33.2 g/dL (ref 32.0–36.0)
MCV: 87.8 fL (ref 80.0–100.0)
MPV: 11.5 fL (ref 7.5–12.5)
Monocytes Relative: 7.4 %
Neutro Abs: 2948 cells/uL (ref 1500–7800)
Neutrophils Relative %: 57.8 %
Platelets: 248 10*3/uL (ref 140–400)
RBC: 4.49 10*6/uL (ref 3.80–5.10)
RDW: 12.7 % (ref 11.0–15.0)
Total Lymphocyte: 31.9 %
WBC: 5.1 10*3/uL (ref 3.8–10.8)

## 2018-07-16 LAB — COMPLETE METABOLIC PANEL WITH GFR
AG Ratio: 1.9 (calc) (ref 1.0–2.5)
ALT: 19 U/L (ref 6–29)
AST: 21 U/L (ref 10–30)
Albumin: 4.2 g/dL (ref 3.6–5.1)
Alkaline phosphatase (APISO): 47 U/L (ref 31–125)
BUN: 14 mg/dL (ref 7–25)
CO2: 25 mmol/L (ref 20–32)
Calcium: 9.1 mg/dL (ref 8.6–10.2)
Chloride: 103 mmol/L (ref 98–110)
Creat: 0.76 mg/dL (ref 0.50–1.10)
GFR, Est African American: 112 mL/min/{1.73_m2} (ref >=60)
GFR, Est Non African American: 97 mL/min/{1.73_m2} (ref >=60)
Globulin: 2.2 g/dL (calc) (ref 1.9–3.7)
Glucose, Bld: 82 mg/dL (ref 65–99)
Potassium: 4.4 mmol/L (ref 3.5–5.3)
Sodium: 137 mmol/L (ref 135–146)
Total Bilirubin: 0.7 mg/dL (ref 0.2–1.2)
Total Protein: 6.4 g/dL (ref 6.1–8.1)

## 2018-07-16 LAB — T3, FREE: T3, Free: 3.2 pg/mL (ref 2.3–4.2)

## 2018-07-16 LAB — TSH: TSH: 1.22 mIU/L

## 2018-07-16 LAB — T4, FREE: Free T4: 0.9 ng/dL (ref 0.8–1.8)

## 2018-08-10 ENCOUNTER — Other Ambulatory Visit: Payer: Self-pay

## 2018-08-10 ENCOUNTER — Ambulatory Visit: Payer: BC Managed Care – PPO | Admitting: Physician Assistant

## 2018-09-16 ENCOUNTER — Ambulatory Visit: Payer: BC Managed Care – PPO | Admitting: Physician Assistant

## 2018-09-28 ENCOUNTER — Encounter: Payer: Self-pay | Admitting: Family Medicine

## 2018-09-28 ENCOUNTER — Ambulatory Visit: Payer: BC Managed Care – PPO | Admitting: Family Medicine

## 2018-09-28 ENCOUNTER — Other Ambulatory Visit: Payer: Self-pay

## 2018-09-28 VITALS — BP 112/66 | HR 72 | Temp 98.3°F | Resp 16 | Ht 67.5 in | Wt 193.0 lb

## 2018-09-28 DIAGNOSIS — H6591 Unspecified nonsuppurative otitis media, right ear: Secondary | ICD-10-CM | POA: Diagnosis not present

## 2018-09-28 MED ORDER — AMOXICILLIN-POT CLAVULANATE 875-125 MG PO TABS: 1.0000 | ORAL_TABLET | Freq: Two times a day (BID) | ORAL | 0 refills | Status: DC

## 2018-09-28 NOTE — Patient Instructions (Signed)
F/U Call if not improved

## 2018-09-28 NOTE — Progress Notes (Signed)
   Subjective:    Patient ID: Janet Welch, female    DOB: Aug 09, 1975, 43 y.o.   MRN: [id number]  Patient presents for R Ear Pain (x1 week- pressure to ear, waxy drainage started on 09/27/2018- no injury or blood)   Right ear pain and pressure, has history of recurrent ear infections. She noticed drainage a few days ago  She had a tube placed last year. No fever, no sinus problems. No recent swimming or water into ear   Taking ibuprofen for pain, benadryl as needed   Dr. Redmond Baseman- ENT in  2019 Review Of Systems:  GEN- denies fatigue, fever, weight loss,weakness, recent illness HEENT- denies eye drainage, change in vision, nasal discharge, CVS- denies chest pain, palpitations RESP- denies SOB, cough, wheeze ABD- denies N/V, change in stools, abd pain GU- denies dysuria, hematuria, dribbling, incontinence MSK- denies joint pain, muscle aches, injury Neuro- denies headache, dizziness, syncope, seizure activity       Objective:    BP 112/66   Pulse 72   Temp 98.3 F (36.8 C) (Oral)   Resp 16   Ht 5' 7.5" (1.715 m)   Wt 193 lb (87.5 kg)   SpO2 99%   BMI 29.78 kg/m  GEN- NAD, alert and oriented x3 HEENT- PERRL, EOMI, non injected sclera, pink conjunctiva, MMM, oropharynx clear, TM clear left side, mild wax in canal, Right canal with fluid and erythema, fluid noted at tip of ear tube  Neck- Supple, no thyromegaly CVS- RRR, no murmur RESP-CTAB         Assessment & Plan:      Problem List Items Addressed This Visit    None    Visit Diagnoses    Right otitis media with effusion    -  Primary   She is at least draining from her tube, will start augmentin, continue ibuprofen, if pain does not improve or symptoms worsen, will get urgent visit with ENT   Relevant Medications   amoxicillin-clavulanate (AUGMENTIN) 875-125 MG tablet      Note: This dictation was prepared with Dragon dictation along with smaller phrase technology. Any transcriptional errors that result from this  process are unintentional.

## 2018-10-04 ENCOUNTER — Telehealth: Payer: Self-pay | Admitting: *Deleted

## 2018-10-04 DIAGNOSIS — H6591 Unspecified nonsuppurative otitis media, right ear: Secondary | ICD-10-CM

## 2018-10-04 NOTE — Telephone Encounter (Signed)
Received call from patient.   States that ear infection has not improved and requested referral to ENT.   Ok to place order?

## 2018-10-04 NOTE — Telephone Encounter (Signed)
Okay to place urgent referral Dr. Redmond Baseman- ENT  acute Otitis media

## 2018-10-05 NOTE — Telephone Encounter (Signed)
Referral orders placed

## 2018-10-07 DIAGNOSIS — H9211 Otorrhea, right ear: Secondary | ICD-10-CM | POA: Diagnosis not present

## 2018-10-07 DIAGNOSIS — Z9622 Myringotomy tube(s) status: Secondary | ICD-10-CM | POA: Diagnosis not present

## 2018-10-07 DIAGNOSIS — H6981 Other specified disorders of Eustachian tube, right ear: Secondary | ICD-10-CM | POA: Diagnosis not present

## 2019-02-24 ENCOUNTER — Other Ambulatory Visit: Payer: Self-pay | Admitting: Obstetrics and Gynecology

## 2019-02-24 DIAGNOSIS — R921 Mammographic calcification found on diagnostic imaging of breast: Secondary | ICD-10-CM

## 2019-03-04 ENCOUNTER — Ambulatory Visit
Admission: RE | Admit: 2019-03-04 | Discharge: 2019-03-04 | Disposition: A | Discharge status: Home / Self Care | Payer: 59 | Source: Ambulatory Visit | Attending: Obstetrics and Gynecology | Admitting: Obstetrics and Gynecology

## 2019-03-04 ENCOUNTER — Other Ambulatory Visit: Payer: Self-pay

## 2019-08-11 ENCOUNTER — Ambulatory Visit (HOSPITAL_COMMUNITY)
Admission: RE | Admit: 2019-08-11 | Discharge: 2019-08-11 | Disposition: A | Discharge status: Home / Self Care | Payer: 59 | Attending: Psychiatry | Admitting: Psychiatry

## 2019-08-11 DIAGNOSIS — G4700 Insomnia, unspecified: Secondary | ICD-10-CM | POA: Diagnosis present

## 2019-08-11 NOTE — BH Assessment (Addendum)
Comprehensive Clinical Assessment (CCA) Screening, Triage and Referral Note   Patient is a 44 yr old female. Patient presents to [facility] as a walk-in. Her brother Su Hoff) was present as support. Patient reports difficult sleeping and "wearing to many hats". Upon assessing her sleep issues she only provided minimum information. She was not able to provide a duration of time of her sleep issues. She was how many hours of sleep she gets a night and she stated, "I don't know". She was asked does she have issues falling asleep or staying alseep". Patient states, "I don't know, I just know I'm really tired". Patient repeats multiple times to her brother that she doesn't need to be here and they are over reacting. She was asking to go home because now she is actually sleepy. Patient says that she asked the front desk secretary could she go to her car to nap and a TTS worker would come outside to get her. States she was told "No" and that she needed to stay in the lobby or she may loose her spot. Patient's brother shares his concerns that he just wants his sister to get a good nights rest. States that she went without sleep before and started hallucinating. He doesn't want that to happen again. Patient denies that she is hallucinating. No AVH's reported. She does not appear to be responding to internal stimuli.   Patient denies SI. Denies history of suicide attempts and/or gestures. Denies HI. Denies history of violent and/or aggressive behaviors. She does share that she feels stressed at times because she has a lot of responsiibilities. 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 She has no history of self mutilating behaviors. No family history of mental health illness. Denies alcohol and drug use.   Patient declined the MSE. However, discussed case with Dr. Dwyane Dee who agreed with patient's discharge. Patient recommended to follow up with her previous providers  Software engineer). She did not recall the providers names but states she will follow up. She was also agreeable to taking a list of referrals for local psychiatrist and therapist for her records. Patient was agreeable to the plan. Her brother also agreed. They were both made aware that if patient has worsening symptoms she may return to [facility], Emergency Department, or [facility] for a evaluation to determine level of care.       08/11/2019 Les Pou [id number]  Visit Diagnosis: No diagnosis found.  Patient Reported Information How did you hear about [place]? Family/Friend   Referral name: No data recorded  Referral phone number: No data recorded Whom do you see for routine medical problems? No data recorded  Practice/Facility Name: No data recorded  Practice/Facility Phone Number: No data recorded  Name of Contact: No data recorded  Contact Number: No data recorded  Contact Fax Number: No data recorded  Prescriber Name: No data recorded  Prescriber Address (if known): No data recorded What Is the Reason for Your Visit/Call Today? "I can't sleep"  How Long Has This Been Causing You Problems? > than 6 months  Have You Recently Been in Any Inpatient Treatment (Hospital/Detox/Crisis Center/28-Day Program)? No   Name/Location of Program/Hospital:No data recorded  How Long Were You There? No data recorded  When Were You Discharged? No data recorded Have You Ever Received Services From [facility] Before? No   Who Do You See at [facility]? No data recorded Have You Recently Had Any  Thoughts About Hurting Yourself? No   Are You Planning to Commit Suicide/Harm Yourself At This time?  No  Have you Recently Had Thoughts About [place]? No   Explanation: No data recorded Have You Used Any Alcohol or Drugs in the Past 24 Hours? No   How Long Ago Did You Use Drugs or Alcohol?  No data recorded  What Did You Use and How Much? No data recorded What Do You Feel Would Help  You the Most Today? No data recorded Do You Currently Have a Therapist/Psychiatrist? No   Name of Therapist/Psychiatrist: No data recorded  Have You Been Recently Discharged From Any Office Practice or Programs? No   Explanation of Discharge From Practice/Program:  No data recorded    CCA Screening Triage Referral Assessment Type of Contact: Tele-Assessment   Is this Initial or Reassessment? Initial Assessment   Date Telepsych consult ordered in [facility]:  No data recorded  Time Telepsych consult ordered in [facility]:  No data recorded Patient Reported Information Reviewed? Yes   Patient Left Without Being Seen? No data recorded  Reason for Not Completing Assessment: No data recorded Collateral Involvement: brother- [name]  Does Patient Have a [place]? No data recorded  Name and Contact of Legal Guardian:  No data recorded If Minor and Not Living with Parent(s), Who has Custody? No data recorded Is CPS involved or ever been involved? Never  Is APS involved or ever been involved? Never  Patient Determined To Be At Risk for Harm To Self or Others Based on Review of Patient Reported Information or Presenting Complaint? No   Method: No data recorded  Availability of Means: No data recorded  Intent: No data recorded  Notification Required: No data recorded  Additional Information for Danger to Others Potential:  No data recorded  Additional Comments for Danger to Others Potential:  No data recorded  Are There Guns or Other Weapons in Your Home?  No data recorded   Types of Guns/Weapons: No data recorded   Are These Weapons Safely Secured?                              No data recorded   Who Could Verify You Are Able To Have These Secured:    No data recorded Do You Have any Outstanding Charges, Pending Court Dates, Parole/Probation? No data recorded Contacted To Inform of Risk of Harm To Self or Others: No data recorded Location of Assessment: [facility] Assessment  Services  Does Patient Present under Involuntary Commitment? No   IVC Papers Initial File Date: No data recorded  [place] of Residence: [place]  Patient Currently Receiving the Following Services: No data recorded  Determination of Need: Routine (7 days)   Options For Referral: No data recorded  Waldon Merl, Counselor

## 2019-08-12 ENCOUNTER — Ambulatory Visit (INDEPENDENT_AMBULATORY_CARE_PROVIDER_SITE_OTHER): Payer: 59 | Admitting: Family Medicine

## 2019-08-12 ENCOUNTER — Other Ambulatory Visit: Payer: Self-pay

## 2019-08-12 VITALS — BP 140/60 | HR 77 | Temp 97.7°F | Ht 67.0 in | Wt 189.0 lb

## 2019-08-12 DIAGNOSIS — F3111 Bipolar disorder, current episode manic without psychotic features, mild: Secondary | ICD-10-CM

## 2019-08-12 MED ORDER — ALPRAZOLAM 1 MG PO TABS
1.0000 mg | ORAL_TABLET | Freq: Three times a day (TID) | ORAL | 0 refills | Status: DC | PRN
Start: 2019-08-12 — End: 2019-08-18

## 2019-08-12 MED ORDER — HYDROXYZINE HCL 25 MG PO TABS: 25.0000 mg | ORAL_TABLET | Freq: Three times a day (TID) | ORAL | 0 refills | Status: DC | PRN

## 2019-08-12 MED ORDER — OLANZAPINE 5 MG PO TABS: 5.0000 mg | ORAL_TABLET | Freq: Every day | ORAL | 3 refills | Status: DC

## 2019-08-12 NOTE — Progress Notes (Signed)
Subjective:    Patient ID: Janet Welch, female    DOB: 04-10-1975, 44 y.o.   MRN: [id number]  HPI Patient is a very pleasant 44 year old Caucasian female who is here today with her brother. She typically sees my partner who is out of town this week. Patient has a past medical history of bipolar disorder. I reviewed her past records. As of last year she was on Zyprexa 5 mg a day with hydroxyzine for breakthrough insomnia and anxiety. I do not see that she is followed up with psychiatry since. Unfortunately, approximately 1 month ago, the patient began developing severe insomnia. She states that she is unable to go to sleep. Her thoughts will race at night and keep her awake. Some nights she will not get any sleep. Other nights will be limited to just a few hours. Today, the patient does have pressured speech. At times her train of thought is hard to follow. I pointed this out to the patient and her brother and they both agree that other family members have mentioned that she is at times "not making sense". She denies any hallucinations or delusions. In the past when her bipolar has been out of control, the patient has had visual hallucinations. At present she reports vivid dreams but she denies any hallucinations. She denies any delusions of grandeur or increased goal-directed behavior however she does report uncontrolled anxiety. She denies any suicidal ideation or homicidal ideation. Past Medical History:  Diagnosis Date  . No pertinent past medical history    Past Surgical History:  Procedure Laterality Date  . CESAREAN SECTION N/A 07/07/2013   Procedure: CESAREAN SECTION;  Surgeon: Linda Hedges, DO;  Location: [facility];  Service: Obstetrics;  Laterality: N/A;  . LEG SKIN LESION  BIOPSY / EXCISION    . scar tissue biopsy     Current Outpatient Medications on File Prior to Visit  Medication Sig Dispense Refill  . diphenhydrAMINE (BENADRYL) 25 MG tablet Take 25-50 mg by mouth at bedtime as needed.      No current facility-administered medications on file prior to visit.   No Known Allergies Social History   Socioeconomic History  . Marital status: Married    Spouse name: Not on file  . Number of children: Not on file  . Years of education: Not on file  . Highest education level: Not on file  Occupational History  . Not on file  Tobacco Use  . Smoking status: Never Smoker  . Smokeless tobacco: Never Used  Substance and Sexual Activity  . Alcohol use: Yes    Alcohol/week: 0.0 - 1.0 standard drinks  . Drug use: No  . Sexual activity: Yes  Other Topics Concern  . Not on file  Social History Narrative  . Not on file   Social Determinants of Health   Financial Resource Strain:   . Difficulty of Paying Living Expenses:   Food Insecurity:   . Worried About Charity fundraiser in the Last Year:   . Arboriculturist in the Last Year:   Transportation Needs:   . Film/video editor (Medical):   Marland Kitchen Lack of Transportation (Non-Medical):   Physical Activity:   . Days of Exercise per Week:   . Minutes of Exercise per Session:   Stress:   . Feeling of Stress :   Social Connections:   . Frequency of Communication with Friends and Family:   . Frequency of Social Gatherings with Friends and Family:   .  Attends Religious Services:   . Active Member of Clubs or Organizations:   . Attends Archivist Meetings:   Marland Kitchen Marital Status:   Intimate Partner Violence:   . Fear of Current or Ex-Partner:   . Emotionally Abused:   Marland Kitchen Physically Abused:   . Sexually Abused:       Review of Systems     Objective:   Physical Exam Vitals reviewed.  Constitutional:      General: She is not in acute distress.    Appearance: Normal appearance. She is not toxic-appearing or diaphoretic.  Cardiovascular:     Rate and Rhythm: Normal rate.  Pulmonary:     Effort: Pulmonary effort is normal. No respiratory distress.  Neurological:     Mental Status: She is alert.    Psychiatric:        Attention and Perception: Attention and perception normal.        Speech: She is communicative. Speech is tangential. Speech is not delayed or slurred.        Behavior: Behavior normal. Behavior is not agitated. Behavior is cooperative.        Thought Content: Thought content normal. Thought content is not paranoid or delusional. Thought content does not include homicidal or suicidal ideation. Thought content does not include homicidal or suicidal plan.        Cognition and Memory: Cognition and memory normal.        Judgment: Judgment normal. Judgment is not impulsive or inappropriate.           Assessment & Plan:  Bipolar disord, crnt episode manic w/o psych features, mild ([place])  I spent more than 30 minutes today with the patient mainly in discussion with her and her brother. She states that she goes through this seemingly every year. I explained to the patient that I feel that her bipolar. She will stop therapy for different reasons and then in stressful periods of time she will experience manic episodes. Her mother has bipolar as well. Patient discontinue Zyprexa last year due to weight gain. We discussed Arman Filter however her insurance will only cover this medication if prescribed by a psychiatrist. Therefore I will start the patient on Zyprexa 5 mg daily which is her previous dose. She can use hydroxyzine 25 mg at night to help her sleep. I will give her Xanax 1 mg every 8 hours as needed for anxiety or insomnia however I explained this to the patient and her brother that this should be a supervised medication administered by either the brother or the husband to avoid accidental overuse due to impulsive decisions and impaired judgment. Recheck next week either with her psychiatrist or with Dr. Buelah Manis. They will contact her psychiatrist today to try to schedule a follow-up appointment.

## 2019-08-17 ENCOUNTER — Telehealth: Payer: Self-pay | Admitting: Physician Assistant

## 2019-08-17 NOTE — Telephone Encounter (Signed)
Husband, [name], called to report that Evangelene is in an irrational state.  He has been trying to get her help since the first appt w/ TH here is 8/17.  [facility] to [facility] who then referred them to her PCP so they saw Dr. Dennard Schaumann and [facility].  Notes of these visits are in her Epic chart.  [name] said she was prescribed Xanax but it is not helping at all.  He has to work, has 4 kids and a wife that has to be watched.  He needs to get her some help.  What advice can you give until she can be seen?

## 2019-08-17 NOTE — Telephone Encounter (Signed)
Left detailed information after reviewing patient's visit to the [facility] with Dr. Dwyane Dee and also visit with her PCP. The PCP reports patient stopped zyprexa a year ago due to weight gain. Last visit with Helene Kelp was last year 06/2018.  Her PCP prescribed Zyprexa 5 mg at hs and hydroxyzine at night. Left message with husband for patient to start or continue taking as prescribed. Also for him to call back with further questions or concerns prior to apt.

## 2019-08-17 NOTE — Telephone Encounter (Signed)
Is she having SI? Psychotic? I recommend she go to the [facility] clinic for eval and treatment.  They can see her as walk-in, is my understanding.

## 2019-08-18 ENCOUNTER — Other Ambulatory Visit: Payer: Self-pay | Admitting: *Deleted

## 2019-08-18 ENCOUNTER — Other Ambulatory Visit: Payer: Self-pay | Admitting: Psychiatry

## 2019-08-18 MED ORDER — ALPRAZOLAM 1 MG PO TABS: 1.0000 mg | ORAL_TABLET | Freq: Three times a day (TID) | ORAL | 0 refills | Status: DC | PRN

## 2019-08-18 NOTE — Telephone Encounter (Signed)
I did not prescribe the Xanax, therefore I will not give any direction on how to take it.  Janet Welch needs to call the physician who prescribed it.  And I will not prescribe benzo until she is seen and depending on the situation, I will prescribe only if appropriate.

## 2019-08-18 NOTE — Telephone Encounter (Signed)
Janet Welch called and said that the xanax one pill 3 times a day doesn't work. He would like to do 2 tabs 3 times a day since that helps. Please give gary a call at [phone]

## 2019-08-18 NOTE — Telephone Encounter (Signed)
Received call from patient brother, Gwyndolyn Saxon [phone].   Reports that patient is using Xanax 1mg  PO TID PRN, but she is using 3x daily almost every day to keep her anxiety controlled.   Reports that he is attempting to get patient an urgent appointment with psychiatry.   Requested refill on Xanax until she is able to be seen there.   Ok to refill?

## 2019-08-22 ENCOUNTER — Encounter: Payer: Self-pay | Admitting: Family Medicine

## 2019-08-22 ENCOUNTER — Other Ambulatory Visit: Payer: Self-pay

## 2019-08-22 ENCOUNTER — Telehealth (INDEPENDENT_AMBULATORY_CARE_PROVIDER_SITE_OTHER): Payer: 59 | Admitting: Family Medicine

## 2019-08-22 DIAGNOSIS — F312 Bipolar disorder, current episode manic severe with psychotic features: Secondary | ICD-10-CM | POA: Diagnosis not present

## 2019-08-22 MED ORDER — ALPRAZOLAM 1 MG PO TABS: 1.0000 mg | ORAL_TABLET | Freq: Three times a day (TID) | ORAL | 0 refills | Status: DC | PRN

## 2019-08-22 NOTE — Progress Notes (Signed)
Virtual Visit via   Note  I connected with Janet Welch on 08/22/19 at  by video at 11:30am   and verified that I am speaking with the correct person using two identifiers.      Pt location: at home   Physician location:  In office, [place], Vic Blackbird MD     On call: patient /Patient Brother  Janet Welch and physician   I discussed the limitations, risks, security and privacy concerns of performing an evaluation and management service by telephone and the availability of in person appointments. I also discussed with the patient that there may be a patient responsible charge related to this service. The patient expressed understanding and agreed to proceed.   History of Present Illness: Telehealth visit this patient is currently in [place] awaiting her psychiatry appointment which will be tomorrow. Call started on video visit, then there was interruptions with service, had to be completed via telephone only   She was seen on July 23 by my partner.  Secondary to acute manic episode.  Concern for uncontrolled bipolar disorder.  I have never treated her for any mental health issues.  On review of her previous notes with other providers at the clinic and follow-up with Donnal Moat at [facility] for mental health.  She was on Zyprexa for some time it and stopped it secondary to weight gain.  She has issues with insomnia and depression worry on and off since then.  She was also on hydroxyzine in the past and benadryl  On review of the last psychiatry note I see in the system  07/10/2018 it was noted that she had started seeing psychiatry back in April 2019, she was admitted to hospital in March 2019 due to mental health.  She evidently had not been consistently following up due to family obligations after reschedule quite a few times.    He was started back on Zyprexa 5 mg as well as alprazolam 1 mg 3 times a day and hydroxyzine 25 mg 3 times a day.  While on all 3 of the  medications her mood has improved some but she is still having panicky episodes she is staying up the past few nights she has not had any alprazolam for the past 2 days.  She states that she feels very energetic like she needs to exercise her [place].  Last night only slept 2 hours.  States that she feels very happy today compared to how she previously felt when she was more depressed.  Her brother and her husband are concerned about her wellbeing her mental health.  They do not want her to backslide as severely as she did when she was admitted back in 2019.  Because her current psychiatrist could not get her in for another 3 weeks they went to [place],  [place] to be seen by psychiatrist urgently.  Observations/Objective: Unable to visualize pt- NAD noted over phone, but speaking quickly, seemed overly energetic on the phone   Assessment and Plan: Bipolar I disorder with manic disorder. Continue Zyprexa , atrax can be increased to 50mg  at bedtime if needed Xanax refilled as it does help anxiety during the day  Will see what psychiatry recommends tomorrow She needs to remain out of work until medications have stabilized so we will discuss this as well She has supportive family, monitoring her symptoms    Follow Up Instructions:    I discussed the assessment and treatment plan with the patient. The patient was  provided an opportunity to ask questions and all were answered. The patient agreed with the plan and demonstrated an understanding of the instructions.   The patient was advised to call back or seek an in-person evaluation if the symptoms worsen or if the condition fails to improve as anticipated.  I provided  23  minutes of non-face-to-face during this encounter. End Time 11:53  Vic Blackbird, MD

## 2019-08-23 ENCOUNTER — Telehealth: Payer: Self-pay | Admitting: *Deleted

## 2019-08-23 MED ORDER — HYDROXYZINE HCL 25 MG PO TABS: ORAL_TABLET | ORAL | 1 refills | Status: DC

## 2019-08-23 NOTE — Telephone Encounter (Signed)
Received call from patient brother Will [phone]~ telephone.   Reports that Hydroxyzine was discussed during virtual visit on 08/22/2019. States that PCP discussed increasing from 25 to 50mg . States that he does recall there were stipulations in place if patient is taking Xanax as well, but he is unsure of dose. Reports that he is unsure if she can take 50mg  PO TID, or just before bedtime.   Due to sensitive content of note, I am unable to verify.   Also reports that patient requires refill to the [facility] in [place].   MD please advise.

## 2019-08-23 NOTE — Telephone Encounter (Signed)
Call placed to patient and patient brother Janet Welch made aware.

## 2019-08-23 NOTE — Telephone Encounter (Signed)
I had discussed for sleep they have 2 options   They can try  50mg  of hydroxyzine by itself,   Or give her the 25mg  of hydroyzine with the xanax 1mg  (I refilled this yesterday)

## 2019-08-25 ENCOUNTER — Telehealth: Payer: Self-pay | Admitting: *Deleted

## 2019-08-25 NOTE — Telephone Encounter (Signed)
-----   Message from Alycia Rossetti, MD sent at 08/24/2019  5:14 PM EDT ----- Regarding: FW: Call pt Brother Wed Can you call brother and get F/U on med changes from psychiatry Thursday  ----- Message ----- From: Alycia Rossetti, MD Sent: 08/24/2019 To: Alycia Rossetti, MD Subject: Call pt Brother Wed

## 2019-08-25 NOTE — Telephone Encounter (Signed)
Call placed to patient brother Bill [phone]~ telephone.   Reports that patient was seen by psychiatry and new medications were given as follows: D/C Zyprexa Begin Vraylar 3mg  PO Q HS, Nuedexta 20mg  PO Q AM  States that patient has filled prescription for Xanax, but has not used it. States that she is using Atarax for insomnia as needed.   Reports that patient is much improved. Of note, patient has follow up with psychiatry on Friday.

## 2019-08-26 NOTE — Telephone Encounter (Signed)
Noted  

## 2019-09-06 ENCOUNTER — Ambulatory Visit: Payer: Self-pay | Admitting: Physician Assistant

## 2020-11-19 ENCOUNTER — Ambulatory Visit: Payer: 59 | Admitting: Nurse Practitioner

## 2020-11-21 ENCOUNTER — Other Ambulatory Visit: Payer: Self-pay

## 2020-11-21 ENCOUNTER — Encounter: Payer: Self-pay | Admitting: Nurse Practitioner

## 2020-11-21 ENCOUNTER — Ambulatory Visit (INDEPENDENT_AMBULATORY_CARE_PROVIDER_SITE_OTHER): Payer: 59 | Admitting: Nurse Practitioner

## 2020-11-21 VITALS — BP 128/82 | HR 76 | Temp 98.6°F | Resp 18 | Ht 67.0 in | Wt 192.0 lb

## 2020-11-21 DIAGNOSIS — G4700 Insomnia, unspecified: Secondary | ICD-10-CM | POA: Diagnosis not present

## 2020-11-21 DIAGNOSIS — F312 Bipolar disorder, current episode manic severe with psychotic features: Secondary | ICD-10-CM

## 2020-11-21 MED ORDER — HYDROXYZINE HCL 25 MG PO TABS: 25.0000 mg | ORAL_TABLET | Freq: Every evening | ORAL | 1 refills | Status: DC | PRN

## 2020-11-21 NOTE — Progress Notes (Signed)
Subjective:    Patient ID: Janet Welch, female    DOB: February 28, 1975, 45 y.o.   MRN: [id number]  HPI: Janet Welch is a 45 y.o. female presenting with brother, Rush Landmark, for anxiety.  Permission given by patient for brother to stay in the room during the encoutner.  Chief Complaint  Patient presents with   Anxiety   ANXIETY/BIPOLAR Previously, took alprazolam 1 mg three times daily as needed, hydroxyzine 25 mg, and Zyprexa 5 mg daily.  Previously saw Psychiatry.  Patient reports these medications did not work and she is not willing to re-establish with Psychiatry.  She not been taking this medication for more than 1 year.  Brother reports side effects outweighed benefits. Patient reports that her family is telling her she needs to sleep more, but she constantly thinks she needs to do something around the house.  She has 4 children and reports husband has PTSD.  She reports she is extremely tired today and asks her brother to take over answering questions since she cannot focus on my questions.  She does ask if I can prescribe hemp oil for her.  She has been taking this without much success.  Today, she denies auditory or visual hallucinations.  She denies SI and HI.  Reports she just needs help from her family.  Brother reports the family feels like she is losing sleep and is having trouble with bipolar mood.  He reports she has tried multiple medications in the past to help "regulate mood."  Was previously seen by Psychiatry.  He thinks the side effects of the medications previously prescribed outweighed the benefits.    Duration: chronic Anxious mood: yes  Excessive worrying: yes Irritability: yes  Sweating: no Nausea: no Palpitations:no Hyperventilation: no Panic attacks: no Agoraphobia: no  Obscessions/compulsions: no Depressed mood: no  No Known Allergies  Outpatient Encounter Medications as of 11/21/2020  Medication Sig   OVER THE COUNTER MEDICATION HEMP GUMMIES   [DISCONTINUED]  diphenhydrAMINE (BENADRYL) 25 MG tablet Take 25-50 mg by mouth at bedtime as needed.   hydrOXYzine (ATARAX/VISTARIL) 25 MG tablet Take 1 tablet (25 mg total) by mouth at bedtime as needed. Take 1-2 tablet TID prn anxiety/sleep   [DISCONTINUED] ALPRAZolam (XANAX) 1 MG tablet Take 1 tablet (1 mg total) by mouth 3 (three) times daily as needed for anxiety. (Patient not taking: Reported on 11/21/2020)   [DISCONTINUED] hydrOXYzine (ATARAX/VISTARIL) 25 MG tablet Take 1-2 tablet TID prn anxiety/sleep (Patient not taking: Reported on 11/21/2020)   [DISCONTINUED] OLANZapine (ZYPREXA) 5 MG tablet Take 1 tablet (5 mg total) by mouth at bedtime. (Patient not taking: Reported on 11/21/2020)   No facility-administered encounter medications on file as of 11/21/2020.    Patient Active Problem List   Diagnosis Date Noted   Bipolar I disorder, current or most recent episode manic, severe with mood-congruent psychotic features ([place]) 04/01/2017   MDD (major depressive disorder), severe ([place]) 03/30/2017   MDD (major depressive disorder), recurrent severe, without psychosis ([place]) 03/30/2017    Past Medical History:  Diagnosis Date   No pertinent past medical history     Relevant past medical, surgical, family and social history reviewed and updated as indicated. Interim medical history since our last visit reviewed.  Review of Systems Per HPI unless specifically indicated above     Objective:    BP 128/82 (BP Location: Left Arm, Patient Position: Sitting)   Pulse 76   Temp 98.6 F (37 C) (Oral)   Resp 18  Ht 5\' 7"  (1.702 m)   Wt 192 lb (87.1 kg)   SpO2 97%   BMI 30.07 kg/m   Wt Readings from Last 3 Encounters:  11/21/20 192 lb (87.1 kg)  08/12/19 189 lb (85.7 kg)  09/28/18 193 lb (87.5 kg)    Physical Exam Vitals and nursing note reviewed.  Constitutional:      General: She is not in acute distress.    Appearance: Normal appearance. She is not toxic-appearing.     Comments: Tired appearing   Cardiovascular:     Rate and Rhythm: Normal rate and regular rhythm.     Heart sounds: Normal heart sounds. No murmur heard. Pulmonary:     Effort: Pulmonary effort is normal. No respiratory distress.     Breath sounds: Normal breath sounds. No wheezing, rhonchi or rales.  Skin:    General: Skin is warm and dry.     Coloration: Skin is not jaundiced or pale.     Findings: No erythema.  Neurological:     Mental Status: She is alert and oriented to person, place, and time.  Psychiatric:        Attention and Perception: Attention normal.        Mood and Affect: Mood is anxious.        Speech: Speech is rapid and pressured.        Behavior: Behavior is slowed. Behavior is cooperative.        Thought Content: Thought content normal.        Cognition and Memory: Cognition and memory normal.      Assessment & Plan:   Problem List Items Addressed This Visit       Other   Bipolar I disorder, current or most recent episode manic, severe with mood-congruent psychotic features ([place])    Chronic.  Patient is not willing to restart medication or collaborating with Psychiatry today.  I told her that I cannot prescribe or recommend a dose of hemp oil as this is not approved treatment for bipolar disorder.  She is willing to start counseling, will check into places and let me know where she would like referral sent.  I strongly encouraged resuming medication to help stabilize mood, I suspect she may be recovering from or on the verge of a manic episode.  She declines this today.  She is agreeable to try resuming hydroxyzine to help with insomnia. She is not a danger to herself or anyone else today and has the ability to make her own medical decisions.  She was informed I would be willing to prescribe medication if she changes her mind.        Other Visit Diagnoses     Insomnia, unspecified type    -  Primary   Relevant Medications   hydrOXYzine (ATARAX/VISTARIL) 25 MG tablet        Follow up  plan: Return if symptoms worsen or fail to improve.  30+ minutes spent with the patient and brother today in discussing previous bipolar diagnosis and approved treatment options including Psychiatry, Psychology, and pharmacotherapy options.

## 2020-11-22 ENCOUNTER — Ambulatory Visit (HOSPITAL_COMMUNITY)
Admission: EM | Admit: 2020-11-22 | Discharge: 2020-11-23 | Disposition: A | Discharge status: Home / Self Care | Payer: 59 | Attending: Behavioral Health | Admitting: Behavioral Health

## 2020-11-22 ENCOUNTER — Other Ambulatory Visit: Payer: Self-pay

## 2020-11-22 ENCOUNTER — Encounter (HOSPITAL_COMMUNITY): Payer: Self-pay | Admitting: Emergency Medicine

## 2020-11-22 DIAGNOSIS — Z79899 Other long term (current) drug therapy: Secondary | ICD-10-CM | POA: Diagnosis not present

## 2020-11-22 DIAGNOSIS — F319 Bipolar disorder, unspecified: Secondary | ICD-10-CM | POA: Insufficient documentation

## 2020-11-22 DIAGNOSIS — F3110 Bipolar disorder, current episode manic without psychotic features, unspecified: Secondary | ICD-10-CM | POA: Diagnosis not present

## 2020-11-22 DIAGNOSIS — Z20822 Contact with and (suspected) exposure to covid-19: Secondary | ICD-10-CM | POA: Insufficient documentation

## 2020-11-22 DIAGNOSIS — F419 Anxiety disorder, unspecified: Secondary | ICD-10-CM | POA: Diagnosis present

## 2020-11-22 LAB — HEMOGLOBIN A1C
Hgb A1c MFr Bld: 5.1 % (ref 4.8–5.6)
Mean Plasma Glucose: 99.67 mg/dL

## 2020-11-22 LAB — CBC WITH DIFFERENTIAL/PLATELET
Abs Immature Granulocytes: 0.03 10*3/uL (ref 0.00–0.07)
Basophils Absolute: 0.1 10*3/uL (ref 0.0–0.1)
Basophils Relative: 1 %
Eosinophils Absolute: 0.1 10*3/uL (ref 0.0–0.5)
Eosinophils Relative: 1 %
HCT: 41.2 % (ref 36.0–46.0)
Hemoglobin: 13.8 g/dL (ref 12.0–15.0)
Immature Granulocytes: 0 %
Lymphocytes Relative: 27 %
Lymphs Abs: 2.5 10*3/uL (ref 0.7–4.0)
MCH: 29.2 pg (ref 26.0–34.0)
MCHC: 33.5 g/dL (ref 30.0–36.0)
MCV: 87.1 fL (ref 80.0–100.0)
Monocytes Absolute: 0.6 10*3/uL (ref 0.1–1.0)
Monocytes Relative: 6 %
Neutro Abs: 5.8 10*3/uL (ref 1.7–7.7)
Neutrophils Relative %: 65 %
Platelets: 246 10*3/uL (ref 150–400)
RBC: 4.73 MIL/uL (ref 3.87–5.11)
RDW: 12.7 % (ref 11.5–15.5)
WBC: 9 10*3/uL (ref 4.0–10.5)
nRBC: 0 % (ref 0.0–0.2)

## 2020-11-22 LAB — POCT URINE DRUG SCREEN - MANUAL ENTRY (I-SCREEN)
POC Amphetamine UR: NOT DETECTED
POC Buprenorphine (BUP): NOT DETECTED
POC Cocaine UR: NOT DETECTED
POC Marijuana UR: POSITIVE — AB
POC Methadone UR: NOT DETECTED
POC Methamphetamine UR: NOT DETECTED
POC Morphine: NOT DETECTED
POC Oxazepam (BZO): NOT DETECTED
POC Oxycodone UR: NOT DETECTED
POC Secobarbital (BAR): NOT DETECTED

## 2020-11-22 LAB — LIPID PANEL
Cholesterol: 144 mg/dL (ref 0–200)
HDL: 61 mg/dL (ref >40)
LDL Cholesterol: 70 mg/dL (ref 0–99)
Total CHOL/HDL Ratio: 2.4 RATIO
Triglycerides: 63 mg/dL (ref <150)
VLDL: 13 mg/dL (ref 0–40)

## 2020-11-22 LAB — ETHANOL: Alcohol, Ethyl (B): <10 mg/dL (ref <10)

## 2020-11-22 LAB — COMPREHENSIVE METABOLIC PANEL
ALT: 14 U/L (ref 0–44)
AST: 19 U/L (ref 15–41)
Albumin: 4.1 g/dL (ref 3.5–5.0)
Alkaline Phosphatase: 52 U/L (ref 38–126)
Anion gap: 11 (ref 5–15)
BUN: 10 mg/dL (ref 6–20)
CO2: 23 mmol/L (ref 22–32)
Calcium: 9.2 mg/dL (ref 8.9–10.3)
Chloride: 101 mmol/L (ref 98–111)
Creatinine, Ser: 0.94 mg/dL (ref 0.44–1.00)
GFR, Estimated: >60 mL/min (ref >60)
Glucose, Bld: 103 mg/dL — ABNORMAL HIGH (ref 70–99)
Potassium: 3.5 mmol/L (ref 3.5–5.1)
Sodium: 135 mmol/L (ref 135–145)
Total Bilirubin: 1 mg/dL (ref 0.3–1.2)
Total Protein: 6.6 g/dL (ref 6.5–8.1)

## 2020-11-22 LAB — RESP PANEL BY RT-PCR (FLU A&B, COVID) ARPGX2
Influenza A by PCR: NEGATIVE
Influenza B by PCR: NEGATIVE
SARS Coronavirus 2 by RT PCR: NEGATIVE

## 2020-11-22 LAB — POC SARS CORONAVIRUS 2 AG -  ED: SARS Coronavirus 2 Ag: NEGATIVE

## 2020-11-22 LAB — TSH: TSH: 2.004 u[IU]/mL (ref 0.350–4.500)

## 2020-11-22 MED ORDER — OLANZAPINE 5 MG PO TBDP
5.0000 mg | ORAL_TABLET | Freq: Once | ORAL | Status: AC
  Administered 2020-11-22: 5 mg via ORAL
  Filled 2020-11-22: qty 1

## 2020-11-22 MED ORDER — ALUM & MAG HYDROXIDE-SIMETH 200-200-20 MG/5ML PO SUSP: 30.0000 mL | ORAL | Status: DC | PRN

## 2020-11-22 MED ORDER — TRAZODONE HCL 50 MG PO TABS
50.0000 mg | ORAL_TABLET | Freq: Every evening | ORAL | Status: DC | PRN
  Administered 2020-11-22: 50 mg via ORAL
  Filled 2020-11-22: qty 1

## 2020-11-22 MED ORDER — OLANZAPINE 5 MG PO TBDP: 5.0000 mg | ORAL_TABLET | Freq: Every day | ORAL | Status: DC

## 2020-11-22 MED ORDER — ACETAMINOPHEN 325 MG PO TABS: 650.0000 mg | ORAL_TABLET | Freq: Four times a day (QID) | ORAL | Status: DC | PRN

## 2020-11-22 MED ORDER — MAGNESIUM HYDROXIDE 400 MG/5ML PO SUSP: 30.0000 mL | Freq: Every day | ORAL | Status: DC | PRN

## 2020-11-22 MED ORDER — HYDROXYZINE HCL 25 MG PO TABS
25.0000 mg | ORAL_TABLET | Freq: Three times a day (TID) | ORAL | Status: DC | PRN
  Administered 2020-11-22: 25 mg via ORAL
  Filled 2020-11-22: qty 1

## 2020-11-22 NOTE — ED Notes (Signed)
Pt requesting to leave at present.  Laying down at present.

## 2020-11-22 NOTE — ED Provider Notes (Signed)
Behavioral Health Admission [facility] & OBS)  Date: 11/22/20 Patient Name: Janet Welch MRN: [id number]    Diagnoses:  Final diagnoses:  Bipolar I disorder, most recent episode (or current) manic [facility])    HPI: Patient presents to the [facility] voluntarily as a walk-in accompanied by her brother [name] with a chief complaint of "increased anxiety and nervious energy."   Patient seen and evaluated face-to-face by this provider with her brother present, chart reviewed and case discussed with Dr. Dwyane Dee. On evaluation, patient is alert and oriented x3. Her thought process is logical and speech tangential, rapid and pressured. She is noted to be snapping her right fingers throughout the assessment and having difficulty sitting still. She presents with emotional instability and difficulty concentrating throughout the assessment. She reports feeling anxious and states that when she stomps her feet she feels better. She reports feeling anxious all her life. She describes her anxiety as "focusing on the wrong things." She states that she has no idea when her anxiety started. She reports having suicidal thoughts "only in my mind." She denies having thoughts of wanting to kill herself or others. She reports seeing people sometimes that other people cannot see. Her brother states that she was in the lobby talking to people who weren't present. She reports hearing voices in her mind but "not right now." She reports poor sleep. Her brother states that she started losing sleep a couple weeks ago. He states that she has been sleeping on average 2 hours. Patient reports having racing thoughts at night. She reports having lots of energy. She reports having difficulty concentrating. She reports that she forgets to eat sometimes. She reports using CBD hemp oil, and gummies as needed to help her relax. She states that she started using CBD last year. She reports that she resides with  her husband, four children and parents. Her brother reports that she has multiple guns in the home that are not locked up. She reports that she does not have an outpatient psychiatrist or therapist.    [place] 2-9:  [facility] Office Visit from 06/06/2016 in [place]  Thoughts that you would be better off dead, or of hurting yourself in some way Not at all  PHQ-9 Total Score 0         Total Time spent with patient: 30 minutes  Musculoskeletal  Strength & Muscle Tone: within normal limits Gait & Station: normal Patient leans: N/A  Psychiatric Specialty Exam  Presentation General Appearance: Appropriate for Environment  Eye Contact:Minimal  Speech:No data recorded Speech Volume:Increased  Handedness:No data recorded  Mood and Affect  Mood:Labile  Affect:Labile   Thought Process  Thought Processes:Coherent  Descriptions of Associations:Intact  Orientation:Full (Time, Place and Person)  Thought Content:Logical    Hallucinations:Hallucinations: Visual; Auditory  Ideas of Reference:None  Suicidal Thoughts:Suicidal Thoughts: Yes, Passive SI Passive Intent and/or Plan: Without Intent; Without Plan  Homicidal Thoughts:Homicidal Thoughts: No   Sensorium  Memory:Immediate Fair; Recent Fair; Remote Fair  Judgment:Intact  Insight:Present   Executive Functions  Concentration:No data recorded Attention Span:Poor  Recall:Poor; [place]   Psychomotor Activity  Psychomotor Activity:Psychomotor Activity: Restlessness   Assets  Assets:Communication Skills; Desire for Improvement; Financial Resources/Insurance; Housing; Intimacy; Leisure Time; Physical Health; Social Support   Sleep  Sleep:Sleep: Poor Number of Hours of Sleep: 2   Nutritional Assessment (For OBS and [facility] admissions only) Has the patient had a weight loss or  gain of 10 pounds or more in the last 3 months?: No Has the patient had a  decrease in food intake/or appetite?: No Does the patient have dental problems?: No Does the patient have eating habits or behaviors that may be indicators of an eating disorder including binging or inducing vomiting?: No Has the patient recently lost weight without trying?: 0 Has the patient been eating poorly because of a decreased appetite?: 0 Malnutrition Screening Tool Score: 0   Physical Exam Constitutional:      Appearance: Normal appearance.  HENT:     Head: Normocephalic.     Nose: Nose normal.  Eyes:     Conjunctiva/sclera: Conjunctivae normal.  Cardiovascular:     Rate and Rhythm: Normal rate.  Pulmonary:     Effort: Pulmonary effort is normal.  Musculoskeletal:     Cervical back: Normal range of motion.  Neurological:     Mental Status: She is alert and oriented to person, place, and time.   Review of Systems  Constitutional: Negative.   HENT: Negative.    Eyes: Negative.   Respiratory: Negative.    Cardiovascular: Negative.   Gastrointestinal: Negative.   Genitourinary: Negative.   Musculoskeletal: Negative.   Skin: Negative.   Neurological: Negative.   Endo/Heme/Allergies: Negative.    Blood pressure 137/72, pulse 88, temperature 97.8 F (36.6 C), temperature source Oral, resp. rate 18, height 5\' 7"  (1.702 m), weight 194 lb (88 kg), SpO2 99 %. Body mass index is 30.38 kg/m.  Past Psychiatric History: Hx of bipolar 1, 2 prior psychiatric hospitalizations. Prescribed Zyprexa 5 mg po QHS, xanaz 1 mg po TID as needed and vistaril 25 mg po TID as needed.   Is the patient at risk to self? Yes  Has the patient been a risk to self in the past 6 months? No .    Has the patient been a risk to self within the distant past? No   Is the patient a risk to others? No   Has the patient been a risk to others in the past 6 months? No   Has the patient been a risk to others within the distant past? No   Past Medical History:  Past Medical History:  Diagnosis Date   No  pertinent past medical history     Past Surgical History:  Procedure Laterality Date   CESAREAN SECTION N/A 07/07/2013   Procedure: CESAREAN SECTION;  Surgeon: Linda Hedges, DO;  Location: [facility];  Service: Obstetrics;  Laterality: N/A;   LEG SKIN LESION  BIOPSY / EXCISION     scar tissue biopsy      Family History:  Family History  Problem Relation Age of Onset   Thyroid disease Mother    Parkinson's disease Father     Social History:  Social History   Socioeconomic History   Marital status: Married    Spouse name: Not on file   Number of children: Not on file   Years of education: Not on file   Highest education level: Not on file  Occupational History   Not on file  Tobacco Use   Smoking status: Never   Smokeless tobacco: Never  Vaping Use   Vaping Use: Never used  Substance and Sexual Activity   Alcohol use: Yes    Alcohol/week: 0.0 - 1.0 standard drinks   Drug use: No   Sexual activity: Yes  Other Topics Concern   Not on file  Social History Narrative   Not on file  Social Determinants of Health   Financial Resource Strain: Not on file  Food Insecurity: Not on file  Transportation Needs: Not on file  Physical Activity: Not on file  Stress: Not on file  Social Connections: Not on file  Intimate Partner Violence: Not on file    SDOH:  SDOH Screenings   Alcohol Screen: Not on file  Depression (PHQ2-9): Not on file  Financial Resource Strain: Not on file  Food Insecurity: Not on file  Housing: Not on file  Physical Activity: Not on file  Social Connections: Not on file  Stress: Not on file  Tobacco Use: Low Risk    Smoking Tobacco Use: Never   Smokeless Tobacco Use: Never   Passive Exposure: Not on file  Transportation Needs: Not on file    Last Labs:  No visits with results within 6 Month(s) from this visit.  Latest known visit with results is:  Office Visit on 07/14/2018  Component Date Value Ref Range Status   WBC 07/14/2018 5.1  3.8 -  10.8 Thousand/uL Final   RBC 07/14/2018 4.49  3.80 - 5.10 Million/uL Final   Hemoglobin 07/14/2018 13.1  11.7 - 15.5 g/dL Final   HCT 07/14/2018 39.4  35.0 - 45.0 % Final   MCV 07/14/2018 87.8  80.0 - 100.0 fL Final   MCH 07/14/2018 29.2  27.0 - 33.0 pg Final   MCHC 07/14/2018 33.2  32.0 - 36.0 g/dL Final   RDW 07/14/2018 12.7  11.0 - 15.0 % Final   Platelets 07/14/2018 248  140 - 400 Thousand/uL Final   MPV 07/14/2018 11.5  7.5 - 12.5 fL Final   Neutro Abs 07/14/2018 2,948  1,500 - 7,800 cells/uL Final   Lymphs Abs 07/14/2018 1,627  850 - 3,900 cells/uL Final   Absolute Monocytes 07/14/2018 377  200 - 950 cells/uL Final   Eosinophils Absolute 07/14/2018 97  15 - 500 cells/uL Final   Basophils Absolute 07/14/2018 51  0 - 200 cells/uL Final   Neutrophils Relative % 07/14/2018 57.8  % Final   Total Lymphocyte 07/14/2018 31.9  % Final   Monocytes Relative 07/14/2018 7.4  % Final   Eosinophils Relative 07/14/2018 1.9  % Final   Basophils Relative 07/14/2018 1.0  % Final   Glucose, Bld 07/14/2018 82  65 - 99 mg/dL Final   Comment: .            Fasting reference interval .    BUN 07/14/2018 14  7 - 25 mg/dL Final   Creat 07/14/2018 0.76  0.50 - 1.10 mg/dL Final   GFR, Est Non African American 07/14/2018 97  > OR = 60 mL/min/1.11m2 Final   GFR, Est African American 07/14/2018 112  > OR = 60 mL/min/1.46m2 Final   BUN/Creatinine Ratio 57/26/2035 NOT APPLICABLE  6 - 22 (calc) Final   Sodium 07/14/2018 137  135 - 146 mmol/L Final   Potassium 07/14/2018 4.4  3.5 - 5.3 mmol/L Final   Chloride 07/14/2018 103  98 - 110 mmol/L Final   CO2 07/14/2018 25  20 - 32 mmol/L Final   Calcium 07/14/2018 9.1  8.6 - 10.2 mg/dL Final   Total Protein 07/14/2018 6.4  6.1 - 8.1 g/dL Final   Albumin 07/14/2018 4.2  3.6 - 5.1 g/dL Final   Globulin 07/14/2018 2.2  1.9 - 3.7 g/dL (calc) Final   AG Ratio 07/14/2018 1.9  1.0 - 2.5 (calc) Final   Total Bilirubin 07/14/2018 0.7  0.2 - 1.2 mg/dL Final  Alkaline  phosphatase (APISO) 07/14/2018 47  31 - 125 U/L Final   AST 07/14/2018 21  10 - 30 U/L Final   ALT 07/14/2018 19  6 - 29 U/L Final   Hgb A1c MFr Bld 07/14/2018 5.2  <5.7 % of total Hgb Final   Comment: For the purpose of screening for the presence of diabetes: . <5.7%       Consistent with the absence of diabetes 5.7-6.4%    Consistent with increased risk for diabetes             (prediabetes) > or =6.5%  Consistent with diabetes . This assay result is consistent with a decreased risk of diabetes. . Currently, no consensus exists regarding use of hemoglobin A1c for diagnosis of diabetes in children. . According to American Diabetes Association (ADA) guidelines, hemoglobin A1c <7.0% represents optimal control in non-pregnant diabetic patients. Different metrics may apply to specific patient populations.  Standards of Medical Care in Diabetes(ADA). .    Mean Plasma Glucose 07/14/2018 103  (calc) Final   eAG (mmol/L) 07/14/2018 5.7  (calc) Final   Cholesterol 07/14/2018 162  <200 mg/dL Final   HDL 07/14/2018 67  > OR = 50 mg/dL Final   Triglycerides 07/14/2018 49  <150 mg/dL Final   LDL Cholesterol (Calc) 07/14/2018 81  mg/dL (calc) Final   Comment: Reference range: <100 . Desirable range <100 mg/dL for primary prevention;   <70 mg/dL for patients with CHD or diabetic patients  with > or = 2 CHD risk factors. Marland Kitchen LDL-C is now calculated using the Martin-Hopkins  calculation, which is a validated novel method providing  better accuracy than the Friedewald equation in the  estimation of LDL-C.  Cresenciano Genre et al. Annamaria Helling. 6761;950(93): 2061-2068  (http://education.[employer].com/faq/FAQ164)    Total CHOL/HDL Ratio 07/14/2018 2.4  <5.0 (calc) Final   Non-HDL Cholesterol (Calc) 07/14/2018 95  <130 mg/dL (calc) Final   Comment: For patients with diabetes plus 1 major ASCVD risk  factor, treating to a non-HDL-C goal of <100 mg/dL  (LDL-C of <70 mg/dL) is considered a therapeutic   option.    TSH 07/14/2018 1.22  mIU/L Final   Comment:           Reference Range .           > or = 20 Years  0.40-4.50 .                Pregnancy Ranges           First trimester    0.26-2.66           Second trimester   0.55-2.73           Third trimester    0.43-2.91    T3, Free 07/14/2018 3.2  2.3 - 4.2 pg/mL Final   Free T4 07/14/2018 0.9  0.8 - 1.8 ng/dL Final    Allergies: Patient has no known allergies.  PTA Medications: (Not in a hospital admission)   Medical Decision Making  Patient admitted to the [facility] for continuous assessment for mood stabilization and safety. Patient is voluntary and consent to treatment.   Labs:  Lab Orders         Resp Panel by RT-PCR (Flu A&B, Covid) Nasopharyngeal Swab         CBC with Differential/Platelet         Comprehensive metabolic panel         Hemoglobin A1c         Ethanol  Lipid panel         TSH         Pregnancy, urine         POCT Urine Drug Screen - ([facility])         POC SARS Coronavirus 2 Ag-ED - Nasal Swab    EKG  Medications:  Meds ordered this encounter  Medications   acetaminophen (TYLENOL) tablet 650 mg   alum & mag hydroxide-simeth (MAALOX/MYLANTA) 200-200-20 MG/5ML suspension 30 mL   magnesium hydroxide (MILK OF MAGNESIA) suspension 30 mL   hydrOXYzine (ATARAX/VISTARIL) tablet 25 mg   traZODone (DESYREL) tablet 50 mg   OLANZapine zydis (ZYPREXA) disintegrating tablet 5 mg   OLANZapine zydis (ZYPREXA) disintegrating tablet 5 mg   I discussed benefits and risks of restarting olanzapine 5 mg nightly for mood disorder with the patient. Patient verbalizes understanding and agrees to the stated plan. Patient was advised that if weight gain becomes concerning while taking olanzapine, patient can follow up with outpatient psychiatric services for medication management/changes.      Recommendations  Based on my evaluation the patient does not appear to have an emergency medical condition.  Marissa Calamity, NP 11/22/20  5:39 PM

## 2020-11-22 NOTE — BH Assessment (Signed)
Pt reports increased anxiety and "nervous energy" causing her to pace the floor. Pt reports that she is under a lot of stress and has "reverted back to old ways" of not writing in her journal or scheduler. Pt presents hypomanic, and reports difficulty sleeping, AH of hearing "people I love talking to me". Pt reports at night there are a lot of voices that prevent her from sleeping. Pt speech a little pressured and tangential. Pt denies SI, HI and presents tearful at times during assessment.  Per chart review pt has dx of bipolar disorder.   Pt is routine/urgent

## 2020-11-22 NOTE — Assessment & Plan Note (Addendum)
Chronic.  Patient is not willing to restart medication or collaborating with Psychiatry today.  I told her that I cannot prescribe or recommend a dose of hemp oil as this is not approved treatment for bipolar disorder.  She is willing to start counseling, will check into places and let me know where she would like referral sent.  I strongly encouraged resuming medication to help stabilize mood, I suspect she may be recovering from or on the verge of a manic episode.  She declines this today.  She is agreeable to try resuming hydroxyzine to help with insomnia. She is not a danger to herself or anyone else today and has the ability to make her own medical decisions.  She was informed I would be willing to prescribe medication if she changes her mind.

## 2020-11-22 NOTE — ED Notes (Signed)
Pt sleeping at present.  No distress noted.  Monitoring for safety. 

## 2020-11-22 NOTE — ED Notes (Signed)
Received report from [place], pt resting on bed, laughing inappropriately at times.  Calm & cooperative at present.  Presents with a lot of stress, difficulty sleeping and hearing voices that prevent her from sleeping.  Denies SI or HI.  A&O x 4, no distress noted.  Monitoring for safety.
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
## 2020-11-23 DIAGNOSIS — F3110 Bipolar disorder, current episode manic without psychotic features, unspecified: Secondary | ICD-10-CM | POA: Diagnosis not present

## 2020-11-23 LAB — POCT PREGNANCY, URINE: Preg Test, Ur: NEGATIVE

## 2020-11-23 MED ORDER — HYDROXYZINE HCL 25 MG PO TABS: 25.0000 mg | ORAL_TABLET | Freq: Three times a day (TID) | ORAL | 1 refills | Status: DC | PRN

## 2020-11-23 MED ORDER — OLANZAPINE 5 MG PO TBDP: 5.0000 mg | ORAL_TABLET | Freq: Every day | ORAL | 1 refills | Status: DC

## 2020-11-23 MED ORDER — TRAZODONE HCL 50 MG PO TABS: 50.0000 mg | ORAL_TABLET | Freq: Every evening | ORAL | 1 refills | Status: DC | PRN

## 2020-11-23 MED ORDER — LORAZEPAM 1 MG PO TABS
1.0000 mg | ORAL_TABLET | Freq: Once | ORAL | Status: AC
  Administered 2020-11-23: 1 mg via ORAL
  Filled 2020-11-23: qty 1

## 2020-11-23 MED ORDER — OLANZAPINE 5 MG PO TBDP
5.0000 mg | ORAL_TABLET | Freq: Once | ORAL | Status: AC
  Administered 2020-11-23: 5 mg via ORAL
  Filled 2020-11-23: qty 1

## 2020-11-23 NOTE — ED Notes (Signed)
Pt sleeping at present, no distress noted.  Monitoring for safety. 

## 2020-11-23 NOTE — ED Provider Notes (Signed)
FBC/OBS ASAP Discharge Summary  Date and Time: 11/23/2020 5:01 PM  Name: Janet Welch  MRN:  [id number]   Discharge Diagnoses:  Final diagnoses:  Bipolar I disorder, most recent episode (or current) manic ([place])    Subjective:  Patient presents to the [facility] voluntarily as a walk-in accompanied by her brother [name] with a chief complaint of "increased anxiety and nervous energy."    Stay Summary: Patient seen and evaluated face-to-face by this provider, chart reviewed and case discussed with Dr. Dwyane Dee. On evaluation, patient is alert and oriented x4. Her thought process is logical and speech is coherent. Her mood is euthymic and affect is congruent.  Patient presents calm on approach and does not appear restless compared to yesterday. She reports that she was able to sleep much better last night compared to weeks of not sleeping. She reports that she feels like the Zyprexa that she took yesterday helped with her mood. She denies having thoughts of wanting to hurt herself or others. She denies hearing voices or seeing things other people cannot hear or see. She denies feeling paranoid. She reports having a good appetite. She denies having racing thoughts or feeling increasingly anxious. She has been compliant with taking the Zyprexa without any side effects. She has been observed on the unit without any disruptive, aggressive, or self-harm behaviors.  I spoke with the patient's brother [name] via telephone who states that he does not have any safety concerns with the patient returning home today as long as she takes her medications. He states that the guns have been locked up in the home and the patient will not have access to any weapons in the home. He states that he will pick the patient up this evening when he gets off work and transport her home.    Total Time spent with patient: 20 minutes  Past Psychiatric History: Hx of bipolar 1, 2 prior  psychiatric hospitalizations. Prescribed Zyprexa 5 mg po QHS, xanaz 1 mg po TID as needed and vistaril 25 mg po TID as needed.  Past Medical History:  Past Medical History:  Diagnosis Date   No pertinent past medical history     Past Surgical History:  Procedure Laterality Date   CESAREAN SECTION N/A 07/07/2013   Procedure: CESAREAN SECTION;  Surgeon: Linda Hedges, DO;  Location: [facility];  Service: Obstetrics;  Laterality: N/A;   LEG SKIN LESION  BIOPSY / EXCISION     scar tissue biopsy     Family History:  Family History  Problem Relation Age of Onset   Thyroid disease Mother    Parkinson's disease Father    Social History:  Social History   Substance and Sexual Activity  Alcohol Use Yes   Alcohol/week: 0.0 - 1.0 standard drinks     Social History   Substance and Sexual Activity  Drug Use No    Social History   Socioeconomic History   Marital status: Married    Spouse name: Not on file   Number of children: Not on file   Years of education: Not on file   Highest education level: Not on file  Occupational History   Not on file  Tobacco Use   Smoking status: Never   Smokeless tobacco: Never  Vaping Use   Vaping Use: Never used  Substance and Sexual Activity   Alcohol use: Yes    Alcohol/week: 0.0 - 1.0 standard drinks   Drug use: No  Sexual activity: Yes  Other Topics Concern   Not on file  Social History Narrative   Not on file   Social Determinants of Health   Financial Resource Strain: Not on file  Food Insecurity: Not on file  Transportation Needs: Not on file  Physical Activity: Not on file  Stress: Not on file  Social Connections: Not on file   SDOH:  SDOH Screenings   Alcohol Screen: Not on file  Depression (PHQ2-9): Not on file  Financial Resource Strain: Not on file  Food Insecurity: Not on file  Housing: Not on file  Physical Activity: Not on file  Social Connections: Not on file  Stress: Not on file  Tobacco Use: Low Risk     Smoking Tobacco Use: Never   Smokeless Tobacco Use: Never   Passive Exposure: Not on file  Transportation Needs: Not on file    Tobacco Cessation:  N/A, patient does not currently use tobacco products  Current Medications:  Current Facility-Administered Medications  Medication Dose Route Frequency Provider Last Rate Last Admin   acetaminophen (TYLENOL) tablet 650 mg  650 mg Oral Q6H PRN Curren Mohrmann L, NP       alum & mag hydroxide-simeth (MAALOX/MYLANTA) 200-200-20 MG/5ML suspension 30 mL  30 mL Oral Q4H PRN Ibraheem Voris L, NP       hydrOXYzine (ATARAX/VISTARIL) tablet 25 mg  25 mg Oral TID PRN Katelyne Galster L, NP   25 mg at 11/22/20 2022   magnesium hydroxide (MILK OF MAGNESIA) suspension 30 mL  30 mL Oral Daily PRN Rolando Whitby L, NP       OLANZapine zydis (ZYPREXA) disintegrating tablet 5 mg  5 mg Oral QHS Hally Colella L, NP       traZODone (DESYREL) tablet 50 mg  50 mg Oral QHS PRN Sennie Borden L, NP   50 mg at 11/22/20 2022   Current Outpatient Medications  Medication Sig Dispense Refill   hydrOXYzine (ATARAX/VISTARIL) 25 MG tablet Take 1 tablet (25 mg total) by mouth 3 (three) times daily as needed for anxiety. 30 tablet 1   OLANZapine zydis (ZYPREXA) 5 MG disintegrating tablet Take 1 tablet (5 mg total) by mouth at bedtime. 30 tablet 1   traZODone (DESYREL) 50 MG tablet Take 1 tablet (50 mg total) by mouth at bedtime as needed for sleep. 30 tablet 1    PTA Medications: (Not in a hospital admission)   Musculoskeletal  Strength & Muscle Tone: within normal limits Gait & Station: normal Patient leans: N/A  Psychiatric Specialty Exam  Presentation  General Appearance: Appropriate for Environment  Eye Contact:Fair  Speech:Clear and Coherent  Speech Volume:Normal  Handedness:No data recorded  Mood and Affect  Mood:Euthymic  Affect:Congruent   Thought Process  Thought Processes:Coherent  Descriptions of Associations:Intact  Orientation:Full (Time,  Place and Person)  Thought Content:Logical     Hallucinations:Hallucinations: None  Ideas of Reference:None  Suicidal Thoughts:Suicidal Thoughts: No SI Passive Intent and/or Plan: Without Intent; Without Plan  Homicidal Thoughts:Homicidal Thoughts: No   Sensorium  Memory:Immediate Fair; Recent Fair; Remote Fair  Judgment:Fair  Insight:Fair   Executive Functions  Concentration:Fair  Attention Span:Fair  [place]   Psychomotor Activity  Psychomotor Activity:Psychomotor Activity: Normal   Assets  Assets:Communication Skills; Desire for Improvement; Financial Resources/Insurance; Housing; Intimacy; Leisure Time; Physical Health; Social Support   Sleep  Sleep:Sleep: Fair Number of Hours of Sleep: 2   Nutritional Assessment (For OBS and FBC admissions only) Has the patient  had a weight loss or gain of 10 pounds or more in the last 3 months?: No Has the patient had a decrease in food intake/or appetite?: No Does the patient have dental problems?: No Does the patient have eating habits or behaviors that may be indicators of an eating disorder including binging or inducing vomiting?: No Has the patient recently lost weight without trying?: 0 Has the patient been eating poorly because of a decreased appetite?: 0 Malnutrition Screening Tool Score: 0    Physical Exam  Physical Exam Constitutional:      Appearance: Normal appearance.  HENT:     Head: Normocephalic.     Nose: Nose normal.  Eyes:     Conjunctiva/sclera: Conjunctivae normal.  Cardiovascular:     Rate and Rhythm: Normal rate.  Pulmonary:     Effort: Pulmonary effort is normal.  Musculoskeletal:     Cervical back: Normal range of motion.  Neurological:     Mental Status: She is alert and oriented to person, place, and time.   Review of Systems  Constitutional: Negative.   HENT: Negative.    Eyes: Negative.   Respiratory: Negative.     Cardiovascular: Negative.   Gastrointestinal: Negative.   Genitourinary: Negative.   Musculoskeletal: Negative.   Skin: Negative.   Neurological: Negative.   Endo/Heme/Allergies: Negative.   Blood pressure 106/71, pulse 77, temperature 98.2 F (36.8 C), temperature source Oral, resp. rate 18, height 5\' 7"  (1.702 m), weight 194 lb (88 kg), SpO2 100 %. Body mass index is 30.38 kg/m.  Demographic Factors:  Caucasian  Loss Factors: NA  Historical Factors: Impulsivity  Risk Reduction Factors:   Responsible for children under 55 years of age, Sense of responsibility to family, Employed, Living with another person, especially a relative, Positive social support, and Positive coping skills or problem solving skills  Continued Clinical Symptoms:  Previous Psychiatric Diagnoses and Treatments  Cognitive Features That Contribute To Risk:  None    Suicide Risk:  Minimal: No identifiable suicidal ideation.  Patients presenting with no risk factors but with morbid ruminations; may be classified as minimal risk based on the severity of the depressive symptoms  Plan Of Care/Follow-up recommendations:  Activity:  as tolerated  Follow up recommendations:   Follow-up Information     Call  [facility].   Why: to establish services with psychiatry for medication managment Contact information: [address] [place], [place] [address] [phone]        Call  [facility], [place].   Why: To schedule an appointment for medication management and therapy Contact information: [address] [place] [address] [phone]                Medications: Continue Zyprexa Zydis 5 mg p.o. nightly for mood dx Continue trazodone 50 mg p.o. nightly for sleep Continue Vistaril 25 mg p.o. as needed 3 times a day for anxiety  Disposition: discharge to home. Patient will be picked up from the facility by her brother Rush Landmark and transported home.    Marissa Calamity, NP 11/23/2020, 5:01 PM

## 2020-11-23 NOTE — Progress Notes (Signed)
Received Janet Welch this AM awake in her chair bed, she received breakfast. Later she  verbalized her desire to be discharge and feels safe to return home. She denied feeling suicidal and her her current behavior is calm and focused.

## 2020-11-23 NOTE — Discharge Instructions (Addendum)
Please contact one of the following facilities to start medication management and therapy services:   [facility] [id number]  [place], [place] [address] [phone]   [facility]  [address] [place], [place] [address] [phone]  [facility]  [address] [place], [place] [address] [phone]  [facility]  [address] [place], [place] [address] [phone]  [facility]  [place], [place] [address] [phone]  [facility]  [address],  [place], [place] [address] [phone]   Discharge recommendations:  Patient is to take medications as prescribed. Please see information for follow-up appointment with psychiatry and therapy. Please follow up with your primary care provider for all medical related needs.    Therapy: We recommend that patient participate in individual therapy to address mental health concerns.  Medications: The parent/guardian is to contact a medical professional and/or outpatient provider to address any new side effects that develop. Parent/guardian should update outpatient providers of any new medications and/or medication changes.   Atypical antipsychotics: If you are prescribed an atypical antipsychotic, it is recommended that your height, weight, BMI, blood pressure, fasting lipid panel, and fasting blood sugar be monitored by your outpatient providers.  Safety:  The patient should abstain from use of illicit substances/drugs and abuse of any medications. If symptoms worsen or do not continue to improve or if the patient becomes actively suicidal or homicidal then it is recommended that the patient return to the closest hospital emergency department, the [facility], or call 911 for further evaluation and  treatment. National Suicide Prevention Lifeline 1-800-SUICIDE or 310-048-5110.  About 988 988 offers 24/7 access to trained crisis counselors who can help people experiencing mental health-related distress. People can call or text 988 or chat 988lifeline.org for themselves or if they are worried about a loved one who may need crisis support.

## 2020-11-25 ENCOUNTER — Ambulatory Visit (HOSPITAL_COMMUNITY)
Admission: EM | Admit: 2020-11-25 | Discharge: 2020-11-26 | Disposition: A | Discharge status: Other Acute Inpatient Hospital | Payer: 59 | Attending: Family | Admitting: Family

## 2020-11-25 DIAGNOSIS — F22 Delusional disorders: Secondary | ICD-10-CM | POA: Diagnosis not present

## 2020-11-25 DIAGNOSIS — F3160 Bipolar disorder, current episode mixed, unspecified: Secondary | ICD-10-CM | POA: Diagnosis present

## 2020-11-25 DIAGNOSIS — Z8349 Family history of other endocrine, nutritional and metabolic diseases: Secondary | ICD-10-CM | POA: Diagnosis not present

## 2020-11-25 DIAGNOSIS — Z20822 Contact with and (suspected) exposure to covid-19: Secondary | ICD-10-CM | POA: Insufficient documentation

## 2020-11-25 LAB — POCT URINE DRUG SCREEN - MANUAL ENTRY (I-SCREEN)
POC Amphetamine UR: NOT DETECTED
POC Buprenorphine (BUP): NOT DETECTED
POC Cocaine UR: NOT DETECTED
POC Marijuana UR: POSITIVE — AB
POC Methadone UR: NOT DETECTED
POC Methamphetamine UR: NOT DETECTED
POC Morphine: NOT DETECTED
POC Oxazepam (BZO): NOT DETECTED
POC Oxycodone UR: NOT DETECTED
POC Secobarbital (BAR): NOT DETECTED

## 2020-11-25 LAB — POC SARS CORONAVIRUS 2 AG -  ED: SARS Coronavirus 2 Ag: NEGATIVE

## 2020-11-25 LAB — POC SARS CORONAVIRUS 2 AG: SARSCOV2ONAVIRUS 2 AG: NEGATIVE

## 2020-11-25 LAB — RESP PANEL BY RT-PCR (FLU A&B, COVID) ARPGX2
Influenza A by PCR: NEGATIVE
Influenza B by PCR: NEGATIVE
SARS Coronavirus 2 by RT PCR: NEGATIVE

## 2020-11-25 LAB — POCT PREGNANCY, URINE: Preg Test, Ur: NEGATIVE

## 2020-11-25 MED ORDER — ACETAMINOPHEN 325 MG PO TABS: 650.0000 mg | ORAL_TABLET | Freq: Four times a day (QID) | ORAL | Status: DC | PRN

## 2020-11-25 MED ORDER — OLANZAPINE 5 MG PO TABS
5.0000 mg | ORAL_TABLET | Freq: Two times a day (BID) | ORAL | Status: DC
  Administered 2020-11-25 – 2020-11-26 (×3): 5 mg via ORAL
  Filled 2020-11-25 (×3): qty 1

## 2020-11-25 MED ORDER — RISPERIDONE 2 MG PO TABS: 2.0000 mg | ORAL_TABLET | Freq: Two times a day (BID) | ORAL | Status: DC

## 2020-11-25 MED ORDER — ALUM & MAG HYDROXIDE-SIMETH 200-200-20 MG/5ML PO SUSP: 30.0000 mL | ORAL | Status: DC | PRN

## 2020-11-25 MED ORDER — MAGNESIUM HYDROXIDE 400 MG/5ML PO SUSP: 30.0000 mL | Freq: Every day | ORAL | Status: DC | PRN

## 2020-11-25 MED ORDER — TRAZODONE HCL 50 MG PO TABS
50.0000 mg | ORAL_TABLET | Freq: Every evening | ORAL | Status: DC | PRN
  Administered 2020-11-25: 50 mg via ORAL
  Filled 2020-11-25: qty 1

## 2020-11-25 NOTE — BH Assessment (Signed)
Comprehensive Clinical Assessment (CCA) Note  11/25/2020 Janet Welch [id number]  DISPOSITION: Per Ricky Ala NP, pt is recommended for IP treatment.   The patient demonstrates the following risk factors for suicide: Chronic risk factors for suicide include: psychiatric disorder of Bipolar d/o . Acute risk factors for suicide include: N/A. Protective factors for this patient include: positive social support and hope for the future. Considering these factors, the overall suicide risk at this point appears to be low. Patient is appropriate for outpatient follow up.  [place] ED from 11/22/2020 in [place] [facility]      Pt is a 45 yo female presenting voluntarily accompanied by her brother due to worsening symptoms after being discharged from [place] last Friday about 6 pm. Brother has been in constant touch with his sister since that time and was present and participated in the triage assessment with her permission. Pt appears to be having difficulty organizing her thoughts and providing answers to simple and direct questions. Pt denies SI, HI, NSSH, paranoia and alcohol/drug use since discharge on Friday night. Pt denies feeling hopeless, helpless or worthless and stated "I feel pretty good." Pt reports she is hearing voices but, it took a few minutes to arrive at that answer. Pt had difficulty understanding all question that were asked of her. Pt was not able to give details about her family (husband and 4 children) who are out of town this weekend. Brother reported an elevated mood and lessening ability to sleep since Friday when discharged. Pt reported a hx of IP psych admissions with the last one occurring "a few years ago" per brother. Pt and brother reported that pt does not currently have an OP providers. Pt reported access to a firearm and brother stated he would take responsibility to secure it away from the pt for safety.   Pt's  thoughts are disorganized, she is having AH and unable to make simple choices and answer simple questions. Per brother, since Friday when discharged from [facility], pt has increasingly had trouble sleeping. Pt reported stress at work and reports she has not been to work recently. 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 Pt was not able to answer the PHQ 2 & 9 Depression Scale questions due to the symptoms she is experiencing.   Chief Complaint:  Chief Complaint  Patient presents with   Confusion and increasing manic symptoms   Visit Diagnosis:  Bipolar d/o with psychotic features    CCA Screening, Triage and Referral (STR)  Patient Reported Information How did you hear about [place]? Family/Friend  What Is the Reason for Your Visit/Call Today? Pt is a 45 yo female presenting voluntarily accompanied by her brother due to worsening symptoms after being discharged from [place] last Friday about 6 pm. Brother has been in constant touch with his sister since that time and was present and participated in the triage assessment with her permission. Pt appears to be having difficulty organizing her thoughts and providing answers to simple and direct questions. Pt denies SI, HI, NSSH, paranoia and alcohol/drug use since discharge on Friday night. Pt reports she is hearing voices. Pt had difficulty understanding all question that were asked of her. Pt was not able to give details about her family (husband and 4 children) who are out of town this weekend.Brother reported an elevated mood and lessening ablility to sleep since Friday when discharged  How Long Has  This Been Causing You Problems? 1 wk - 1 month  What Do You Feel Would Help You the Most Today? Medication(s)   Have You Recently Had Any Thoughts About Hurting Yourself? No  Are You Planning to Commit Suicide/Harm Yourself At This time? No   Have you Recently Had Thoughts About [place]? No  Are You  Planning to Harm Someone at This Time? No  Explanation: No data recorded  Have You Used Any Alcohol or Drugs in the Past 24 Hours? No  How Long Ago Did You Use Drugs or Alcohol? No data recorded What Did You Use and How Much? No data recorded  Do You Currently Have a Therapist/Psychiatrist? No  Name of Therapist/Psychiatrist: No data recorded  Have You Been Recently Discharged From Any Office Practice or Programs? No  Explanation of Discharge From Practice/Program: No data recorded    CCA Screening Triage Referral Assessment Type of Contact: Face-to-Face  Telemedicine Service Delivery:   Is this Initial or Reassessment? No data recorded Date Telepsych consult ordered in [facility]:  No data recorded Time Telepsych consult ordered in [facility]:  No data recorded Location of Assessment: [facility]  Provider Location: [facility] Assessment Services   Collateral Involvement: Brother, [name], was present and participated in the assessment with pt's verbal persmission.   Does Patient Have a Stage manager Guardian? No data recorded Name and Contact of Legal Guardian: No data recorded If Minor and Not Living with Parent(s), Who has Custody? No data recorded Is CPS involved or ever been involved? -- (uta)  Is APS involved or ever been involved? -- Pincus Badder)   Patient Determined To Be At Risk for Harm To Self or Others Based on Review of Patient Reported Information or Presenting Complaint? -- (Pt's thought are disorganized, she is having AH and unable to make simple choices and answer simple questions.)  Method: No data recorded Availability of Means: No data recorded Intent: No data recorded Notification Required: No data recorded Additional Information for Danger to Others Potential: No data recorded Additional Comments for Danger to Others Potential: No data recorded Are There Guns or Other Weapons in Your Home? No data recorded Types of Guns/Weapons: No data  recorded Are These Weapons Safely Secured?                            No data recorded Who Could Verify You Are Able To Have These Secured: No data recorded Do You Have any Outstanding Charges, Pending Court Dates, Parole/Probation? No data recorded Contacted To Inform of Risk of Harm To Self or Others: No data recorded   Does Patient Present under Involuntary Commitment? No  IVC Papers Initial File Date: No data recorded  [place] of Residence: [place]   Patient Currently Receiving the Following Services: No data recorded  Determination of Need: Emergent (2 hours) (Per Ricky Ala NP, pt is recommended for IP treatment.)   Options For Referral: Inpatient Hospitalization     CCA Biopsychosocial Patient Reported Schizophrenia/Schizoaffective Diagnosis in Past: No data recorded  Strengths: uta   Mental Health Symptoms Depression:   None   Duration of Depressive symptoms:    Mania:   Change in energy/activity; Euphoria; Increased Energy; Racing thoughts; Irritability (reduced need for sleep)   Anxiety:    Worrying; Tension   Psychosis:   Hallucinations (Disorganized thought)   Duration of Psychotic symptoms:  Duration of Psychotic Symptoms: -- (unknown)   Trauma:  None   Obsessions:   None   Compulsions:   None   Inattention:   None   Hyperactivity/Impulsivity:   None   Oppositional/Defiant Behaviors:   N/A   Emotional Irregularity:   None   Other Mood/Personality Symptoms:   uta    Mental Status Exam Appearance and self-care  Stature:   Tall   Weight:   Average weight   Clothing:   Neat/clean; Age-appropriate   Grooming:   Normal   Cosmetic use:   Age appropriate   Posture/gait:   Normal   Motor activity:   Not Remarkable   Sensorium  Attention:   Distractible   Concentration:   Focuses on irrelevancies; Scattered; Preoccupied   Orientation:   Person; Place; Situation   Recall/memory:   Defective in Short-term;  Defective in Immediate; Defective in Remote; Defective in Recent   Affect and Mood  Affect:   Anxious; Tearful; Flat   Mood:   Hypomania   Relating  Eye contact:   Fleeting   Facial expression:   Constricted   Attitude toward examiner:   Cooperative; Manipulative; Guarded   Thought and Language  Speech flow:  Normal; Paucity   Thought content:   Appropriate to Mood and Circumstances   Preoccupation:   None   Hallucinations:   Auditory   Organization:  No data recorded  [employer] of Knowledge:   Fair   Intelligence:   Average   Abstraction:   Concrete   Judgement:   Fair   Reality Testing:   Variable   Insight:   Fair; Lacking   Decision Making:   Impulsive; Only simple   Social Functioning  Social Maturity:   -- Pincus Badder)   Social Judgement:   -- Pincus Badder)   Stress  Stressors:   Family conflict; Work   Coping Ability:   Overwhelmed; Deficient supports   Skill Deficits:   Communication   Supports:   Family; Friends/Service system     Religion: Religion/Spirituality Are You A Religious Person?: No  Leisure/Recreation: Leisure / Recreation Do You Have Hobbies?: Yes Leisure and Hobbies: "Being outdoors"  Exercise/Diet: Exercise/Diet Do You Exercise?:  (uta) Have You Gained or Lost A Significant Amount of Weight in the Past Six Months?:  (uta) Do You Follow a Special Diet?:  (uta) Do You Have Any Trouble Sleeping?: Yes Explanation of Sleeping Difficulties: Per brother, since Friday when discharged from [facility], pt has increasingly had trouble sleeping.   CCA Employment/Education Employment/Work Situation: Employment / Work Situation Employment Situation: Employed (Pt unable to answer) Patient's Job has Been Impacted by Current Illness: Yes Describe how Patient's Job has Been Impacted: Pt reported stress at work and reports she has not been to work recently. 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 Has  Patient ever Been in the [employer]?: No  Education: Education Is Patient Currently Attending School?: No Did You Attend College?: Yes What Type of College Degree Do you Have?: grduated Did You Have An Individualized Education Program (IIEP): No Did You Have Any Difficulty At School?: No   CCA Family/Childhood History Family and Relationship History: Family history Marital status: Married Does patient have children?: Yes How many children?: 4  Childhood History:  Childhood History By whom was/is the patient raised?: Both parents Did patient suffer any verbal/emotional/physical/sexual abuse as a child?:  (Pt reports, "I have memories of things and I don't know if they are real or not") Has patient ever been sexually abused/assaulted/raped as an adolescent  or adult?:  (Pt unable to answer)  Child/Adolescent Assessment:     CCA Substance Use Alcohol/Drug Use: Alcohol / Drug Use Pain Medications: please see mar Prescriptions: please see mar Over the Counter: please see mar History of alcohol / drug use?: No history of alcohol / drug abuse Longest period of sobriety (when/how long): NA                         ASAM's:  Six Dimensions of Multidimensional Assessment  Dimension 1:  Acute Intoxication and/or Withdrawal Potential:      Dimension 2:  Biomedical Conditions and Complications:      Dimension 3:  Emotional, Behavioral, or Cognitive Conditions and Complications:     Dimension 4:  Readiness to Change:     Dimension 5:  Relapse, Continued use, or Continued Problem Potential:     Dimension 6:  Recovery/Living Environment:     ASAM Severity Score:    ASAM Recommended Level of Treatment:     Substance use Disorder (SUD)    Recommendations for Services/Supports/Treatments:    Discharge Disposition:    DSM5 Diagnoses: Patient Active Problem List   Diagnosis Date Noted   Bipolar I disorder, current or most recent episode manic, severe with  mood-congruent psychotic features ([place]) 04/01/2017   MDD (major depressive disorder), severe ([place]) 03/30/2017   MDD (major depressive disorder), recurrent severe, without psychosis ([place]) 03/30/2017     Referrals to Alternative Service(s): Referred to Alternative Service(s):   Place:   Date:   Time:    Referred to Alternative Service(s):   Place:   Date:   Time:    Referred to Alternative Service(s):   Place:   Date:   Time:    Referred to Alternative Service(s):   Place:   Date:   Time:     Shaquella Stamant T, Counselor  Stanton Kidney T. Mare Ferrari, [place], [facility], [facility] Triage Specialist [facility]

## 2020-11-25 NOTE — Discharge Instructions (Signed)
Take all medications as prescribed. Keep all follow-up appointments as scheduled.  Do not consume alcohol or use illegal drugs while on prescription medications. Report any adverse effects from your medications to your primary care provider promptly.  In the event of recurrent symptoms or worsening symptoms, call 911, a crisis hotline, or go to the nearest emergency department for evaluation.   

## 2020-11-25 NOTE — ED Notes (Signed)
Pt is lying in bed sleeping, no distress noted, will continue to monitor patient for safety

## 2020-11-25 NOTE — ED Provider Notes (Signed)
Behavioral Health Admission H&P [facility] & OBS)  Date: 11/25/20 Patient Name: Janet Welch MRN: [id number] Chief Complaint:  Chief Complaint  Patient presents with   Confusion and increasing manic symptoms      Diagnoses:  Final diagnoses:  Bipolar I disorder, most recent episode mixed ([place])    HPI: History of Present illness: Janet Welch is a 45 y.o. female.  Presents to [facility] urgent care accompanied by her brother.  Reports patient was recently discharged 2 days ago due to manic-like behavior.  States " when she first left she was back to herself, however last night and today she is agitated and seen talking to her self." Reported that her husband and children left because of her behavior.   Per chart patient was diagnosed with bipolar I disorder.  States she had been taking medications as indicated.  Denied that they have followed up with additional outpatient resources that was provided at previous discharge.    [name] reports auditory and visual hallucinations.  Reports passive suicidal ideations denying plan or intent.  Patient stated " I don't want to hurt myself now, but I really don't know how to answer that question." Patient appears to be thought blocking, mildly anxious and reports racing thoughts.  Will recommend inpatient admission.  And titrate Zyprexa 5 mg to 5 mg p.o. twice daily.   PHQ 2-9:  [place] Office Visit from 06/06/2016 in [facility]  Thoughts that you would be better off dead, or of hurting yourself in some way Not at all  PHQ-9 Total Score 0       Flowsheet Row ED from 11/22/2020 in [place] High Risk        Total Time spent with patient: 15 minutes  Musculoskeletal  Strength & Muscle Tone: within normal limits Gait & Station: normal Patient leans: N/A  Psychiatric Specialty Exam  Presentation General Appearance: Appropriate for Environment  Eye  Contact:Good  Speech:Clear and Coherent  Speech Volume:Normal  Handedness:Left   Mood and Affect  Mood:Anxious; Depressed  Affect:Congruent   Thought Process  Thought Processes:Coherent  Descriptions of Associations:Intact  Orientation:Full (Time, Place and Person)  Thought Content:Logical    Hallucinations:Hallucinations: Auditory  Ideas of Reference:None  Suicidal Thoughts:Suicidal Thoughts: Yes, Passive SI Passive Intent and/or Plan: Without Intent  Homicidal Thoughts:Homicidal Thoughts: No   Sensorium  Memory:Recent Good; Immediate Good; Remote Fair  Judgment:Fair  Insight:Fair   Executive Functions  Concentration:Fair  Attention Span:Good  [place] of Knowledge:Good  Language:Good   Psychomotor Activity  Psychomotor Activity:Psychomotor Activity: Extrapyramidal Side Effects (EPS); Normal AIMS Completed?: No   Assets  Assets:Desire for Improvement   Sleep  Sleep:Sleep: Fair   Nutritional Assessment (For OBS and [facility]BC admissions only) Has the patient had a weight loss or gain of 10 pounds or more in the last 3 months?: No Has the patient had a decrease in food intake/or appetite?: No Does the patient have dental problems?: No Does the patient have eating habits or behaviors that may be indicators of an eating disorder including binging or inducing vomiting?: No Has the patient recently lost weight without trying?: 0    Physical Exam ROS  Blood pressure (!) 146/88, pulse 73, temperature 97.8 F (36.6 C), temperature source Oral, resp. rate 18, SpO2 99 %. There is no height or weight on file to calculate BMI.  Past Psychiatric History:    Is the patient at risk to self? No  Has the patient been a risk to self in the past 6 months? No .    Has the patient been a risk to self within the distant past? No   Is the patient a risk to others? No   Has the patient been a risk to others in the past 6 months? No   Has the patient  been a risk to others within the distant past? No   Past Medical History:  Past Medical History:  Diagnosis Date   No pertinent past medical history     Past Surgical History:  Procedure Laterality Date   CESAREAN SECTION N/A 07/07/2013   Procedure: CESAREAN SECTION;  Surgeon: Linda Hedges, DO;  Location: [facility];  Service: Obstetrics;  Laterality: N/A;   LEG SKIN LESION  BIOPSY / EXCISION     scar tissue biopsy      Family History:  Family History  Problem Relation Age of Onset   Thyroid disease Mother    Parkinson's disease Father     Social History:  Social History   Socioeconomic History   Marital status: Married    Spouse name: Not on file   Number of children: Not on file   Years of education: Not on file   Highest education level: Not on file  Occupational History   Not on file  Tobacco Use   Smoking status: Never   Smokeless tobacco: Never  Vaping Use   Vaping Use: Never used  Substance and Sexual Activity   Alcohol use: Yes    Alcohol/week: 0.0 - 1.0 standard drinks   Drug use: No   Sexual activity: Yes  Other Topics Concern   Not on file  Social History Narrative   Not on file   Social Determinants of Health   Financial Resource Strain: Not on file  Food Insecurity: Not on file  Transportation Needs: Not on file  Physical Activity: Not on file  Stress: Not on file  Social Connections: Not on file  Intimate Partner Violence: Not on file    SDOH:  SDOH Screenings   Alcohol Screen: Not on file  Depression (PHQ2-9): Not on file  Financial Resource Strain: Not on file  Food Insecurity: Not on file  Housing: Not on file  Physical Activity: Not on file  Social Connections: Not on file  Stress: Not on file  Tobacco Use: Low Risk    Smoking Tobacco Use: Never   Smokeless Tobacco Use: Never   Passive Exposure: Not on file  Transportation Needs: Not on file    Last Labs:  Admission on 11/25/2020  Component Date Value Ref Range Status   POC  Amphetamine UR 11/25/2020 None Detected  NONE DETECTED (Cut Off Level 1000 ng/mL) Final   POC Secobarbital (BAR) 11/25/2020 None Detected  NONE DETECTED (Cut Off Level 300 ng/mL) Final   POC Buprenorphine (BUP) 11/25/2020 None Detected  NONE DETECTED (Cut Off Level 10 ng/mL) Final   POC Oxazepam (BZO) 11/25/2020 None Detected  NONE DETECTED (Cut Off Level 300 ng/mL) Final   POC Cocaine UR 11/25/2020 None Detected  NONE DETECTED (Cut Off Level 300 ng/mL) Final   POC Methamphetamine UR 11/25/2020 None Detected  NONE DETECTED (Cut Off Level 1000 ng/mL) Final   POC Morphine 11/25/2020 None Detected  NONE DETECTED (Cut Off Level 300 ng/mL) Final   POC Oxycodone UR 11/25/2020 None Detected  NONE DETECTED (Cut Off Level 100 ng/mL) Final   POC Methadone UR 11/25/2020 None Detected  NONE DETECTED (Cut Off  Level 300 ng/mL) Final   POC Marijuana UR 11/25/2020 Positive (A)  NONE DETECTED (Cut Off Level 50 ng/mL) Final  Admission on 11/22/2020, Discharged on 11/23/2020  Component Date Value Ref Range Status   SARS Coronavirus 2 by RT PCR 11/22/2020 NEGATIVE  NEGATIVE Final   Comment: (NOTE) SARS-CoV-2 target nucleic acids are NOT DETECTED.  The SARS-CoV-2 RNA is generally detectable in upper respiratory specimens during the acute phase of infection. The lowest concentration of SARS-CoV-2 viral copies this assay can detect is 138 copies/mL. A negative result does not preclude SARS-Cov-2 infection and should not be used as the sole basis for treatment or other patient management decisions. A negative result may occur with  improper specimen collection/handling, submission of specimen other than nasopharyngeal swab, presence of viral mutation(s) within the areas targeted by this assay, and inadequate number of viral copies(<138 copies/mL). A negative result must be combined with clinical observations, patient history, and epidemiological information. The expected result is Negative.  Fact Sheet for  Patients:  EntrepreneurPulse.com.au  Fact Sheet for Healthcare Providers:  IncredibleEmployment.be  This test is no                          t yet approved or cleared by the [place] FDA and  has been authorized for detection and/or diagnosis of SARS-CoV-2 by FDA under an Emergency Use Authorization (EUA). This EUA will remain  in effect (meaning this test can be used) for the duration of the COVID-19 declaration under Section 564(b)(1) of the Act, 21 U.S.C.section 360bbb-3(b)(1), unless the authorization is terminated  or revoked sooner.       Influenza A by PCR 11/22/2020 NEGATIVE  NEGATIVE Final   Influenza B by PCR 11/22/2020 NEGATIVE  NEGATIVE Final   Comment: (NOTE) The Xpert Xpress SARS-CoV-2/FLU/RSV plus assay is intended as an aid in the diagnosis of influenza from Nasopharyngeal swab specimens and should not be used as a sole basis for treatment. Nasal washings and aspirates are unacceptable for Xpert Xpress SARS-CoV-2/FLU/RSV testing.  Fact Sheet for Patients: EntrepreneurPulse.com.au  Fact Sheet for Healthcare Providers: IncredibleEmployment.be  This test is not yet approved or cleared by the [place] FDA and has been authorized for detection and/or diagnosis of SARS-CoV-2 by FDA under an Emergency Use Authorization (EUA). This EUA will remain in effect (meaning this test can be used) for the duration of the COVID-19 declaration under Section 564(b)(1) of the Act, 21 U.S.C. section 360bbb-3(b)(1), unless the authorization is terminated or revoked.  Performed at [facility], [place] [address] [place], [place] [address]    WBC 11/22/2020 9.0  4.0 - 10.5 K/uL Final   RBC 11/22/2020 4.73  3.87 - 5.11 MIL/uL Final   Hemoglobin 11/22/2020 13.8  12.0 - 15.0 g/dL Final   HCT 11/22/2020 41.2  36.0 - 46.0 % Final   MCV 11/22/2020 87.1  80.0 - 100.0 fL Final   MCH 11/22/2020 29.2   26.0 - 34.0 pg Final   MCHC 11/22/2020 33.5  30.0 - 36.0 g/dL Final   RDW 11/22/2020 12.7  11.5 - 15.5 % Final   Platelets 11/22/2020 246  150 - 400 K/uL Final   nRBC 11/22/2020 0.0  0.0 - 0.2 % Final   Neutrophils Relative % 11/22/2020 65  % Final   Neutro Abs 11/22/2020 5.8  1.7 - 7.7 K/uL Final   Lymphocytes Relative 11/22/2020 27  % Final   Lymphs Abs 11/22/2020 2.5  0.7 -  4.0 K/uL Final   Monocytes Relative 11/22/2020 6  % Final   Monocytes Absolute 11/22/2020 0.6  0.1 - 1.0 K/uL Final   Eosinophils Relative 11/22/2020 1  % Final   Eosinophils Absolute 11/22/2020 0.1  0.0 - 0.5 K/uL Final   Basophils Relative 11/22/2020 1  % Final   Basophils Absolute 11/22/2020 0.1  0.0 - 0.1 K/uL Final   Immature Granulocytes 11/22/2020 0  % Final   Abs Immature Granulocytes 11/22/2020 0.03  0.00 - 0.07 K/uL Final   Performed at [facility], [place] [address] [place], [place] [address]   Sodium 11/22/2020 135  135 - 145 mmol/L Final   Potassium 11/22/2020 3.5  3.5 - 5.1 mmol/L Final   Chloride 11/22/2020 101  98 - 111 mmol/L Final   CO2 11/22/2020 23  22 - 32 mmol/L Final   Glucose, Bld 11/22/2020 103 (A)  70 - 99 mg/dL Final   Glucose reference range applies only to samples taken after fasting for at least 8 hours.   BUN 11/22/2020 10  6 - 20 mg/dL Final   Creatinine, Ser 11/22/2020 0.94  0.44 - 1.00 mg/dL Final   Calcium 11/22/2020 9.2  8.9 - 10.3 mg/dL Final   Total Protein 11/22/2020 6.6  6.5 - 8.1 g/dL Final   Albumin 11/22/2020 4.1  3.5 - 5.0 g/dL Final   AST 11/22/2020 19  15 - 41 U/L Final   ALT 11/22/2020 14  0 - 44 U/L Final   Alkaline Phosphatase 11/22/2020 52  38 - 126 U/L Final   Total Bilirubin 11/22/2020 1.0  0.3 - 1.2 mg/dL Final   GFR, Estimated 11/22/2020 >60  >60 mL/min Final   Comment: (NOTE) Calculated using the CKD-EPI Creatinine Equation (2021)    Anion gap 11/22/2020 11  5 - 15 Final   Performed at [place] [address] [place], [place] [address]    Hgb A1c MFr Bld 11/22/2020 5.1  4.8 - 5.6 % Final   Comment: (NOTE) Pre diabetes:          5.7%-6.4%  Diabetes:              >6.4%  Glycemic control for   <7.0% adults with diabetes    Mean Plasma Glucose 11/22/2020 99.67  mg/dL Final   Performed at [facility], [place] [address], [place], [place] [address]   Alcohol, Ethyl (B) 11/22/2020 <10  <10 mg/dL Final   Comment: (NOTE) Lowest detectable limit for serum alcohol is 10 mg/dL.  For medical purposes only. Performed at [facility], [place] [address], [place], [place] [address]    Cholesterol 11/22/2020 144  0 - 200 mg/dL Final   Triglycerides 11/22/2020 63  <150 mg/dL Final   HDL 11/22/2020 61  >40 mg/dL Final   Total CHOL/HDL Ratio 11/22/2020 2.4  RATIO Final   VLDL 11/22/2020 13  0 - 40 mg/dL Final   LDL Cholesterol 11/22/2020 70  0 - 99 mg/dL Final   Comment:        Total Cholesterol/HDL:CHD Risk Coronary Heart Disease Risk Table                     Men   Women  1/2 Average Risk   3.4   3.3  Average Risk       5.0   4.4  2 X Average Risk   9.6   7.1  3 X Average Risk  23.4  11.0        Use the calculated Patient Ratio above and the CHD Risk Table to determine the patient's CHD Risk.        ATP III CLASSIFICATION (LDL):  <100     mg/dL   Optimal  100-129  mg/dL   Near or Above                    Optimal  130-159  mg/dL   Borderline  160-189  mg/dL   High  >190     mg/dL   Very High Performed at [place] [address] [place], [place] [address]    TSH 11/22/2020 2.004  0.350 - 4.500 uIU/mL Final   Comment: Performed by a 3rd Generation assay with a functional sensitivity of <=0.01 uIU/mL. Performed at [facility], [place] [address] [place], [place] [address]    POC Amphetamine UR 11/22/2020 None Detected  NONE DETECTED (Cut Off Level 1000 ng/mL) Final   POC Secobarbital (BAR) 11/22/2020 None Detected  NONE DETECTED (Cut Off Level 300 ng/mL) Final   POC Buprenorphine (BUP)  11/22/2020 None Detected  NONE DETECTED (Cut Off Level 10 ng/mL) Final   POC Oxazepam (BZO) 11/22/2020 None Detected  NONE DETECTED (Cut Off Level 300 ng/mL) Final   POC Cocaine UR 11/22/2020 None Detected  NONE DETECTED (Cut Off Level 300 ng/mL) Final   POC Methamphetamine UR 11/22/2020 None Detected  NONE DETECTED (Cut Off Level 1000 ng/mL) Final   POC Morphine 11/22/2020 None Detected  NONE DETECTED (Cut Off Level 300 ng/mL) Final   POC Oxycodone UR 11/22/2020 None Detected  NONE DETECTED (Cut Off Level 100 ng/mL) Final   POC Methadone UR 11/22/2020 None Detected  NONE DETECTED (Cut Off Level 300 ng/mL) Final   POC Marijuana UR 11/22/2020 Positive (A)  NONE DETECTED (Cut Off Level 50 ng/mL) Final   SARS Coronavirus 2 Ag 11/22/2020 Negative  Negative Final   Preg Test, Ur 11/22/2020 NEGATIVE  NEGATIVE Final   Comment:        THE SENSITIVITY OF THIS METHODOLOGY IS >24 mIU/mL     Allergies: Patient has no known allergies.  PTA Medications: (Not in a hospital admission)   Medical Decision Making  Inpatient admission  - Increased Zyprexa 5 mg po nightly to twice daily - Cogentin 0.5 mg BID PRN for EPS      Recommendations  Based on my evaluation the patient does not appear to have an emergency medical condition.  Derrill Center, NP 11/25/20  12:23 PM

## 2020-11-25 NOTE — ED Notes (Signed)
Pt admitted to continuous assessment due to endorsing passive SI and AVH at first. At current, pt denies SI/HI/AVH but thought blocking observed during assessment and bizarre behaviors. Pt able to answer questions after repeated questioning from staff. Calm, laughs inappropriately to questions being asked. Oriented to unit and unit rules. Will monitor for safety.

## 2020-11-25 NOTE — ED Notes (Signed)
[facility] called for pick up of STAT labs to deliver to [facility].

## 2020-11-25 NOTE — ED Notes (Addendum)
Pt came to nurses station and asked if is time for her medication yet. This nurse advised patient that her next dose of medication is not due until 10 pm and the earliest I can give it is at 9 pm

## 2020-11-25 NOTE — Progress Notes (Signed)
   11/25/20 1140  [place] (Walk-ins at [facility] only)  How Did You Hear About [place]? Family/Friend  What Is the Reason for Your Visit/Call Today? Pt is a 45 yo female presenting voluntarily accompanied by her brother due to worsening symptoms after being discharged from [facility] last Friday about 6 pm. Brother has been in constant touch with his sister since that time and was present and participated in the triage assessment with her permission. Pt appears to be having difficulty organizing her thoughts and providing answers to simple and direct questions. Pt denies SI, HI, NSSH, paranoia and alcohol/drug use since discharge on Friday night. Pt reports she is hearing voices. Pt had difficulty understanding all question that were asked of her. Pt was not able to give details about her family (husband and 4 children) who are out of town this weekend.Brother reported an elevated mood and lessening ablility to sleep since Friday when discharged  How Long Has This Been Causing You Problems? 1 wk - 1 month  Have You Recently Had Any Thoughts About Hurting Yourself? No  Are You Planning to Commit Suicide/Harm Yourself At This time? No  Have you Recently Had Thoughts About [place]? No  Are You Planning To Harm Someone At This Time? No  Are you currently experiencing any auditory, visual or other hallucinations? Yes  Please explain the hallucinations you are currently experiencing: AH  Have You Used Any Alcohol or Drugs in the Past 24 Hours? No  Do you have any current medical co-morbidities that require immediate attention? No  Clinician description of patient physical appearance/behavior: Pt is casually dressed and appears adequately groomed. Pt is calm, cooperative and seems overly thoughful. Pt's speech and movements are wihtin normal range. Pt's thoughts seem disorganized and difficult for her to come to a conclusion in order to answer a simple direct question. Pt states she "feels good  and positive" however, her affect is flat and not incongruent. Pt is alert and seems oriented x 4 with some delay in her answers when asked questions.  What Do You Feel Would Help You the Most Today?  ("not sure")  If access to [facility] was not available, would you have sought care in the Emergency Department? Yes  Determination of Need Routine (7 days)  Options For Referral Medication Management  Annita Ratliff T. Mare Ferrari, [place], [facility], [facility] Triage Specialist [facility]

## 2020-11-26 ENCOUNTER — Encounter (HOSPITAL_COMMUNITY): Payer: Self-pay | Admitting: Family

## 2020-11-26 ENCOUNTER — Telehealth (HOSPITAL_COMMUNITY): Payer: Self-pay | Admitting: Family Medicine

## 2020-11-26 ENCOUNTER — Inpatient Hospital Stay (HOSPITAL_COMMUNITY)
Admission: AD | Admit: 2020-11-26 | Discharge: 2020-12-03 | DRG: 885 | Disposition: A | Discharge status: Home / Self Care | Payer: 59 | Attending: Psychiatry | Admitting: Psychiatry

## 2020-11-26 DIAGNOSIS — S069XAA Unspecified intracranial injury with loss of consciousness status unknown, initial encounter: Secondary | ICD-10-CM

## 2020-11-26 DIAGNOSIS — Z91030 Bee allergy status: Secondary | ICD-10-CM

## 2020-11-26 DIAGNOSIS — F312 Bipolar disorder, current episode manic severe with psychotic features: Principal | ICD-10-CM

## 2020-11-26 DIAGNOSIS — F3160 Bipolar disorder, current episode mixed, unspecified: Secondary | ICD-10-CM | POA: Diagnosis present

## 2020-11-26 DIAGNOSIS — E221 Hyperprolactinemia: Secondary | ICD-10-CM | POA: Diagnosis present

## 2020-11-26 DIAGNOSIS — R45851 Suicidal ideations: Secondary | ICD-10-CM | POA: Diagnosis present

## 2020-11-26 DIAGNOSIS — Z820 Family history of epilepsy and other diseases of the nervous system: Secondary | ICD-10-CM | POA: Diagnosis not present

## 2020-11-26 DIAGNOSIS — G4700 Insomnia, unspecified: Secondary | ICD-10-CM | POA: Diagnosis present

## 2020-11-26 DIAGNOSIS — W57XXXA Bitten or stung by nonvenomous insect and other nonvenomous arthropods, initial encounter: Secondary | ICD-10-CM | POA: Diagnosis present

## 2020-11-26 DIAGNOSIS — Z87820 Personal history of traumatic brain injury: Secondary | ICD-10-CM | POA: Diagnosis not present

## 2020-11-26 DIAGNOSIS — Z818 Family history of other mental and behavioral disorders: Secondary | ICD-10-CM | POA: Diagnosis not present

## 2020-11-26 DIAGNOSIS — Z8349 Family history of other endocrine, nutritional and metabolic diseases: Secondary | ICD-10-CM | POA: Diagnosis not present

## 2020-11-26 DIAGNOSIS — F419 Anxiety disorder, unspecified: Secondary | ICD-10-CM | POA: Diagnosis present

## 2020-11-26 DIAGNOSIS — F251 Schizoaffective disorder, depressive type: Secondary | ICD-10-CM | POA: Diagnosis present

## 2020-11-26 DIAGNOSIS — F1290 Cannabis use, unspecified, uncomplicated: Secondary | ICD-10-CM | POA: Diagnosis present

## 2020-11-26 MED ORDER — MAGNESIUM HYDROXIDE 400 MG/5ML PO SUSP: 30.0000 mL | Freq: Every day | ORAL | Status: DC | PRN

## 2020-11-26 MED ORDER — ACETAMINOPHEN 325 MG PO TABS: 650.0000 mg | ORAL_TABLET | Freq: Four times a day (QID) | ORAL | Status: DC | PRN

## 2020-11-26 MED ORDER — OLANZAPINE 5 MG PO TBDP
5.0000 mg | ORAL_TABLET | Freq: Two times a day (BID) | ORAL | Status: DC
  Administered 2020-11-26 – 2020-11-27 (×2): 5 mg via ORAL
  Filled 2020-11-26 (×8): qty 1

## 2020-11-26 MED ORDER — TRAZODONE HCL 50 MG PO TABS
50.0000 mg | ORAL_TABLET | Freq: Every evening | ORAL | Status: DC | PRN
  Administered 2020-11-27 – 2020-12-02 (×5): 50 mg via ORAL
  Filled 2020-11-26 (×6): qty 1

## 2020-11-26 MED ORDER — ALUM & MAG HYDROXIDE-SIMETH 200-200-20 MG/5ML PO SUSP: 30.0000 mL | ORAL | Status: DC | PRN

## 2020-11-26 MED ORDER — OLANZAPINE 5 MG PO TBDP: 5.0000 mg | ORAL_TABLET | Freq: Every day | ORAL | Status: DC

## 2020-11-26 NOTE — Tx Team (Signed)
Initial Treatment Plan 11/26/2020 5:02 PM [name] MBE:[id number]    PATIENT STRESSORS: Financial difficulties   Health problems   Marital or family conflict     PATIENT STRENGTHS: Ability for insight  Communication skills  Supportive family/friends    PATIENT IDENTIFIED PROBLEMS: Paranoia  Auditory hallucination  Anxiety  Depression               DISCHARGE CRITERIA:  Ability to meet basic life and health needs Adequate post-discharge living arrangements Safe-care adequate arrangements made  PRELIMINARY DISCHARGE PLAN: Attend aftercare/continuing care group Outpatient therapy Return to previous living arrangement  PATIENT/FAMILY INVOLVEMENT: This treatment plan has been presented to and reviewed with the patient, Janet Welch, and/or family member.  The patient and family have been given the opportunity to ask questions and make suggestions.  Vela Prose, RN 11/26/2020, 5:02 PM

## 2020-11-26 NOTE — Progress Notes (Signed)
Admission Note: Patient is a 45 year old female admitted to the unit from [facility] for symptoms of paranoia, auditory hallucination and depression.  Patient presents with a flat affect and depressed mood.  Patient is guarded with some period of thought blocking.  Patient was minimal and forward little information throughout assessment.  Stated she is here due to spiritual and psychological warfare.  Stated goals is to figure out her priorities and give herself a schedule.  Admission plan of care reviewed with consent signed.  Skin assessment completed.   Skin is dry and intact.  No personal belongings on admission.  Oriented patient to the unit, staff and room.  Safety checks initiated.  Verbalizes understanding of unit rules/protocols.  Patient is safe on the unit.

## 2020-11-26 NOTE — ED Notes (Signed)
Pt is lying in bed quietly with eyes open, no distress noted, will continue to monitor patient for safety.

## 2020-11-26 NOTE — ED Notes (Signed)
Pt stated, "I'm a woman so I talk to myself. My head feels quiet right now" when questioned if she was having any AVH. Denies SI/HI. Accepted morning meds w/o difficulty. Safety maintained and will continue to monitor.

## 2020-11-26 NOTE — Progress Notes (Signed)
Patient awake and up, but quiet.  No distress noted.  Continue to monitor for safety.

## 2020-11-26 NOTE — ED Provider Notes (Signed)
FBC/OBS ASAP Discharge Summary  Date and Time: 11/26/2020 12:08 PM  Name: Janet Welch  MRN:  [id number]   Discharge Diagnoses:  Final diagnoses:  Bipolar I disorder, most recent episode mixed [facility])    Subjective:  Janet Welch reported "  I am feeling better, but my brain is still foggy."   Stay Summary:  Patient is accepted to [facility]- [facility] unit.  History of Present illness: "Janet Welch is a 45 y.o. female.  Presents to [facility] urgent care accompanied by her brother.  Reports patient was recently discharged 2 days ago due to manic-like behavior.  States " when she first left she was back to herself, however last night and today she is agitated and seen talking to her self." Reported that her husband and children left because of her behavior.  During evaluation Janet Welch is sitting  in no acute distress. She is alert/oriented x to self and place.  calm/cooperative; and mood congruent with affect.  She appears to be thought blocking and slow to respond. She is able to speaking in a clear tone at moderate volume, and normal pace; with good eye contact. However her thought process is liner and incoherent at times. She continues to present paranoia.      Total Time spent with patient: 15 minutes  Past Psychiatric History: Past Medical History:  Past Medical History:  Diagnosis Date   No pertinent past medical history     Past Surgical History:  Procedure Laterality Date   CESAREAN SECTION N/A 07/07/2013   Procedure: CESAREAN SECTION;  Surgeon: Linda Hedges, DO;  Location: [facility];  Service: Obstetrics;  Laterality: N/A;   LEG SKIN LESION  BIOPSY / EXCISION     scar tissue biopsy     Family History:  Family History  Problem Relation Age of Onset   Thyroid disease Mother    Parkinson's disease Father    Family Psychiatric History:  Social History:  Social History   Substance and Sexual Activity  Alcohol Use Yes   Alcohol/week: 0.0 - 1.0 standard drinks     Social History    Substance and Sexual Activity  Drug Use No    Social History   Socioeconomic History   Marital status: Married    Spouse name: Not on file   Number of children: Not on file   Years of education: Not on file   Highest education level: Not on file  Occupational History   Not on file  Tobacco Use   Smoking status: Never   Smokeless tobacco: Never  Vaping Use   Vaping Use: Never used  Substance and Sexual Activity   Alcohol use: Yes    Alcohol/week: 0.0 - 1.0 standard drinks   Drug use: No   Sexual activity: Yes  Other Topics Concern   Not on file  Social History Narrative   Not on file   Social Determinants of Health   Financial Resource Strain: Not on file  Food Insecurity: Not on file  Transportation Needs: Not on file  Physical Activity: Not on file  Stress: Not on file  Social Connections: Not on file   SDOH:  SDOH Screenings   Alcohol Screen: Not on file  Depression (CZY6-0): Not on file  Financial Resource Strain: Not on file  Food Insecurity: Not on file  Housing: Not on file  Physical Activity: Not on file  Social Connections: Not on file  Stress: Not on file  Tobacco Use: Low Risk  Smoking Tobacco Use: Never   Smokeless Tobacco Use: Never   Passive Exposure: Not on file  Transportation Needs: Not on file    Tobacco Cessation:  N/A, patient does not currently use tobacco products  Current Medications:  Current Facility-Administered Medications  Medication Dose Route Frequency Provider Last Rate Last Admin   acetaminophen (TYLENOL) tablet 650 mg  650 mg Oral Q6H PRN Derrill Center, NP       alum & mag hydroxide-simeth (MAALOX/MYLANTA) 200-200-20 MG/5ML suspension 30 mL  30 mL Oral Q4H PRN Derrill Center, NP       magnesium hydroxide (MILK OF MAGNESIA) suspension 30 mL  30 mL Oral Daily PRN Derrill Center, NP       OLANZapine (ZYPREXA) tablet 5 mg  5 mg Oral BID Derrill Center, NP   5 mg at 11/26/20 0914   traZODone (DESYREL) tablet 50 mg   50 mg Oral QHS PRN Derrill Center, NP   50 mg at 11/25/20 2129   Current Outpatient Medications  Medication Sig Dispense Refill   hydrOXYzine (ATARAX/VISTARIL) 25 MG tablet Take 1 tablet (25 mg total) by mouth 3 (three) times daily as needed for anxiety. 30 tablet 1   OLANZapine zydis (ZYPREXA) 5 MG disintegrating tablet Take 1 tablet (5 mg total) by mouth at bedtime. 30 tablet 1   traZODone (DESYREL) 50 MG tablet Take 1 tablet (50 mg total) by mouth at bedtime as needed for sleep. 30 tablet 1    PTA Medications: (Not in a hospital admission)   Musculoskeletal  Strength & Muscle Tone: within normal limits Gait & Station: normal Patient leans: N/A  Psychiatric Specialty Exam  Presentation  General Appearance: Appropriate for Environment  Eye Contact:Good  Speech:Clear and Coherent  Speech Volume:Normal  Handedness:Left   Mood and Affect  Mood:Anxious; Depressed  Affect:Congruent   Thought Process  Thought Processes:Coherent  Descriptions of Associations:Intact  Orientation:Full (Time, Place and Person)  Thought Content:Logical     Hallucinations:Hallucinations: Auditory  Ideas of Reference:None  Suicidal Thoughts:Suicidal Thoughts: Yes, Passive SI Passive Intent and/or Plan: Without Intent  Homicidal Thoughts:Homicidal Thoughts: No   Sensorium  Memory:Recent Good; Immediate Good; Remote Fair  Judgment:Fair  Insight:Fair   Executive Functions  Concentration:Fair  Attention Span:Good  [place] of Knowledge:Good  Language:Good   Psychomotor Activity  Psychomotor Activity:Psychomotor Activity: Extrapyramidal Side Effects (EPS); Normal AIMS Completed?: No   Assets  Assets:Desire for Improvement   Sleep  Sleep:Sleep: Fair   Nutritional Assessment (For OBS and FBC admissions only) Has the patient had a weight loss or gain of 10 pounds or more in the last 3 months?: No Has the patient had a decrease in food intake/or  appetite?: No Does the patient have dental problems?: No Does the patient have eating habits or behaviors that may be indicators of an eating disorder including binging or inducing vomiting?: No Has the patient recently lost weight without trying?: 0    Physical Exam  Physical Exam Vitals and nursing note reviewed.  Cardiovascular:     Rate and Rhythm: Normal rate and regular rhythm.  Pulmonary:     Effort: Pulmonary effort is normal.  Psychiatric:        Mood and Affect: Mood normal.        Thought Content: Thought content normal.   Review of Systems  Constitutional: Negative.   HENT: Negative.    Eyes: Negative.   Genitourinary: Negative.   Psychiatric/Behavioral:  Positive for depression and hallucinations. Negative  for substance abuse and suicidal ideas. The patient is nervous/anxious.   All other systems reviewed and are negative. Blood pressure 140/81, pulse 67, temperature 98.1 F (36.7 C), temperature source Oral, resp. rate 18, SpO2 98 %. There is no height or weight on file to calculate BMI.  Demographic Factors:  Caucasian  Loss Factors: Loss of significant relationship  Historical Factors: NA  Risk Reduction Factors:   Living with another person, especially a relative, Positive social support, and Positive therapeutic relationship  Continued Clinical Symptoms:  Bipolar Disorder:   Mixed State  Cognitive Features That Contribute To Risk:  Polarized thinking    Suicide Risk:  Minimal: No identifiable suicidal ideation.  Patients presenting with no risk factors but with morbid ruminations; may be classified as minimal risk based on the severity of the depressive symptoms  Plan Of Care/Follow-up recommendations:  Activity:  as tolerated Diet:  heart healthy  Disposition: Take all medications as prescribed. Keep all follow-up appointments as scheduled.  Do not consume alcohol or use illegal drugs while on prescription medications. Report any adverse  effects from your medications to your primary care provider promptly.  In the event of recurrent symptoms or worsening symptoms, call 911, a crisis hotline, or go to the nearest emergency department for evaluation.    Derrill Center, NP 11/26/2020, 12:08 PM

## 2020-11-26 NOTE — BHH Group Notes (Signed)
[place] Group Notes:  (Nursing/MHT/Case Management/Adjunct)  Date:  11/26/2020  Time:  10:12 PM  Type of Therapy:  Psychoeducational Skills  Participation Level:  None  Participation Quality:  Resistant  Affect:  Resistant  Cognitive:  Lacking  Insight:  None  Engagement in Group:  None  Modes of Intervention:  Education  Summary of Progress/Problems: The patient did not attend group this evening.   Archie Balboa S 11/26/2020, 10:12 PM

## 2020-11-26 NOTE — Group Note (Signed)
Occupational Therapy Group Note  Group Topic:Self-Esteem  Group Date: 11/26/2020 Start Time: 1400 End Time: 1435 Facilitators: Ponciano Ort, OT/L   Group Description: Group encouraged increased engagement and participation through discussion and activity focused on self-esteem. Patients explored and discussed the differences between healthy and low self-esteem and how it affects our daily lives and occupations with a focus on relationships, work, school, self-care, and personal leisure interests. Group discussion then transitioned into identifying specific strategies to boost self-esteem and engaged in a collaborative and independent activity looking at positive ways to describe oneself A-Z.   Therapeutic Goal(s): Understand and recognize the differences between healthy and low self-esteem Identify healthy strategies to improve/build self-esteem    Participation Level: Did not attend   Plan: Continue to engage patient in OT groups 2 - 3x/week.  11/26/2020  Ponciano Ort, OT/L

## 2020-11-26 NOTE — Progress Notes (Signed)
D: Patient presents with flat affect and is cautious and guarded upon interaction. Patient denies SI/HI and denies VH. Patient reports AH of voices telling her negative things but denies that they are command in nature. Patient contracts for safety.  A: Provided positive reinforcement and encouragement.  R: Patient cooperative and receptive to efforts. Patient remains safe on the unit at this time.   11/26/20 2146  Psych Admission Type (Psych Patients Only)  Admission Status Involuntary  Psychosocial Assessment  Patient Complaints Other (Comment) (Patient reports hearing voices)  Eye Contact Fair  Facial Expression Flat  Affect Appropriate to circumstance;Flat  Speech Logical/coherent;Soft  Interaction Cautious;Minimal;Guarded  Motor Activity Other (Comment) (WDL)  Appearance/Hygiene Unremarkable  Behavior Characteristics Cooperative;Appropriate to situation  Mood Pleasant  Thought Process  Coherency WDL  Content WDL  Delusions None reported or observed  Perception Hallucinations  Hallucination Auditory  Judgment Impaired  Confusion None  Danger to Self  Current suicidal ideation? Denies  Danger to Others  Danger to Others None reported or observed

## 2020-11-26 NOTE — BHH Group Notes (Signed)
Pt did not attend evening group.

## 2020-11-26 NOTE — Group Note (Signed)
  Type of Therapy and Topic:  Group Therapy:  Healthy and Unhealthy Supports  Participation Level:  Did Not Attend   Description of Group:  Patients in this group were introduced to the idea of adding a variety of healthy supports to address the various needs in their lives.Patients discussed what additional healthy supports could be helpful in their recovery and wellness after discharge in order to prevent future hospitalizations.   An emphasis was placed on using counselor, doctor, therapy groups, 12-step groups, and problem-specific support groups to expand supports.  They also worked as a group on developing a specific plan for several patients to deal with unhealthy supports through [place], psychoeducation with loved ones, and even termination of relationships.   Therapeutic Goals:   1)  discuss importance of adding supports to stay well once out of the hospital  2)  compare healthy versus unhealthy supports and identify some examples of each  3)  generate ideas and descriptions of healthy supports that can be added  4)  offer mutual support about how to address unhealthy supports  5)  encourage active participation in and adherence to discharge plan    Summary of Patient Progress:  Did not attend   Therapeutic Modalities:   [place], LCSW 11/26/2020  2:00 PM

## 2020-11-26 NOTE — BH Assessment (Signed)
Care Management - Follow Up [facility] Discharges   Patient has been placed in an inpatient psychiatric hospital ([facility]) on 11-26-2020

## 2020-11-26 NOTE — ED Notes (Signed)
Safe transport called, report called to [place] at [facility] and faxed Voluntary Admission Form to [phone].

## 2020-11-26 NOTE — ED Notes (Signed)
Discharge instructions provided and Pt stated understanding. Pt alert, orient and ambulatory prior to d/c from facility. No personal belongings to be returned. Brother took everything with him at the time of admission. Safe transport called for transportation services. Pt escorted to the sally port. Safety maintained.

## 2020-11-26 NOTE — Progress Notes (Signed)
Pt accepted to [facility]1     Patient meets inpatient criteria per Ricky Ala, NP   The attending provider will be Magdalen Spatz, MD    Call report to [phone]    Leota Jacobsen, LPN @ [facility] notified.     Pt scheduled  to arrive at [facility] at 1300. IVC paperwork needs to be sent via fax prior to transporting the Pt.    Mariea Clonts, MSW, LCSW-A  1:27 PM 11/26/2020

## 2020-11-26 NOTE — BHH Group Notes (Signed)
Patient did not attend group.    Spiritual care group on grief and loss facilitated by chaplain Katy Lior Hoen, B[facility]   Group Goal:   Support / Education around grief and loss   Members engage in facilitated group support and psycho-social education.   Group Description:   Following introductions and group rules, group members engaged in facilitated group dialog and support around topic of loss, with particular support around experiences of loss in their lives. Group Identified types of loss (relationships / self / things) and identified patterns, circumstances, and changes that precipitate losses. Reflected on thoughts / feelings around loss, normalized grief responses, and recognized variety in grief experience. Group noted Worden's four tasks of grief in discussion.   Group drew on Adlerian / Rogerian, narrative, MI,    

## 2020-11-27 DIAGNOSIS — F312 Bipolar disorder, current episode manic severe with psychotic features: Secondary | ICD-10-CM | POA: Diagnosis not present

## 2020-11-27 LAB — LIPID PANEL
Cholesterol: 155 mg/dL (ref 0–200)
HDL: 63 mg/dL (ref >40)
LDL Cholesterol: 79 mg/dL (ref 0–99)
Total CHOL/HDL Ratio: 2.5 RATIO
Triglycerides: 67 mg/dL (ref <150)
VLDL: 13 mg/dL (ref 0–40)

## 2020-11-27 LAB — TSH: TSH: 1.801 u[IU]/mL (ref 0.350–4.500)

## 2020-11-27 MED ORDER — OLANZAPINE 5 MG PO TBDP
5.0000 mg | ORAL_TABLET | Freq: Two times a day (BID) | ORAL | Status: AC
  Administered 2020-11-27: 5 mg via ORAL
  Filled 2020-11-27 (×2): qty 1

## 2020-11-27 MED ORDER — HYDROXYZINE HCL 25 MG PO TABS
25.0000 mg | ORAL_TABLET | Freq: Four times a day (QID) | ORAL | Status: DC | PRN
  Administered 2020-11-27 – 2020-12-02 (×4): 25 mg via ORAL
  Filled 2020-11-27 (×4): qty 1

## 2020-11-27 MED ORDER — OLANZAPINE 5 MG PO TBDP
5.0000 mg | ORAL_TABLET | Freq: Three times a day (TID) | ORAL | Status: DC | PRN
  Administered 2020-11-30: 5 mg via ORAL
  Filled 2020-11-27: qty 1

## 2020-11-27 MED ORDER — DIPHENHYDRAMINE HCL 25 MG PO CAPS
25.0000 mg | ORAL_CAPSULE | Freq: Once | ORAL | Status: DC
  Filled 2020-11-27: qty 1

## 2020-11-27 MED ORDER — LORAZEPAM 1 MG PO TABS: 1.0000 mg | ORAL_TABLET | ORAL | Status: DC | PRN

## 2020-11-27 MED ORDER — HYDROCORTISONE 1 % EX CREA: TOPICAL_CREAM | Freq: Two times a day (BID) | CUTANEOUS | Status: DC | PRN

## 2020-11-27 MED ORDER — ZIPRASIDONE MESYLATE 20 MG IM SOLR: 20.0000 mg | INTRAMUSCULAR | Status: DC | PRN

## 2020-11-27 MED ORDER — BENZTROPINE MESYLATE 0.5 MG PO TABS: 0.5000 mg | ORAL_TABLET | Freq: Two times a day (BID) | ORAL | Status: DC | PRN

## 2020-11-27 MED ORDER — HYDROCORTISONE 1 % EX CREA
TOPICAL_CREAM | Freq: Two times a day (BID) | CUTANEOUS | Status: DC
  Administered 2020-11-28: 1 via TOPICAL
  Filled 2020-11-27 (×2): qty 28

## 2020-11-27 MED ORDER — OLANZAPINE 10 MG PO TBDP
10.0000 mg | ORAL_TABLET | Freq: Every day | ORAL | Status: DC
  Administered 2020-11-28: 10 mg via ORAL
  Filled 2020-11-27 (×3): qty 1
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
## 2020-11-27 NOTE — BHH Group Notes (Signed)
[facility] Group Notes:  (Nursing/MHT/Case Management/Adjunct)  Date:  11/27/2020  Time:  9:14 PM  Type of Therapy:  Psychoeducational Skills  Participation Level:  Minimal  Participation Quality:  Attentive  Affect:  Blunted  Cognitive:  Appropriate  Insight:  Improving  Engagement in Group:  Improving  Modes of Intervention:  Education  Summary of Progress/Problems: The patient rated her day as a 5 out of 10 since she had a bug bite on her arm. Her goal for tomorrow is to go home.  Archie Balboa S 11/27/2020, 9:14 PM

## 2020-11-27 NOTE — Progress Notes (Signed)
Pt denies SI/HI/AH but does endorse VH. Pt stated that she hears talking but they are not telling her anything specifically. Pt verbally agrees to approach staff if these become apparent or before harming themselves/others. Rates depression 0/10. Rates anxiety 5/10. Rates pain 0/10. Pt was minimal and seemed to not want to talk more than she had to. Pt has come out of her room for some of the day. Pt seems to be slightly isolative. Scheduled medications administered to Pt, per MD orders. RN provided support and encouragement to Pt. Q15 min safety checks implemented and continued. Pt safe on the unit. RN will continue to monitor and intervene as needed.    11/27/20 0744  Psych Admission Type (Psych Patients Only)  Admission Status Involuntary  Psychosocial Assessment  Patient Complaints Anxiety;Other (Comment) (hearing voices)  Eye Contact Fair  Facial Expression Flat  Affect Appropriate to circumstance;Anxious;Flat  Speech Logical/coherent;Soft  Interaction Cautious;Minimal;Guarded  Motor Activity Other (Comment) (WDL)  Appearance/Hygiene Unremarkable  Behavior Characteristics Cooperative;Anxious;Calm  Mood Anxious  Aggressive Behavior  Effect No apparent injury  Thought Process  Coherency WDL  Content WDL  Delusions None reported or observed  Perception Hallucinations  Hallucination Auditory  Judgment Impaired  Confusion None  Danger to Self  Current suicidal ideation? Denies  Danger to Others  Danger to Others None reported or observed
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
## 2020-11-27 NOTE — Group Note (Signed)
Recreation Therapy Group Note   Group Topic:Animal Assisted Therapy   Group Date: 11/27/2020 Start Time: 1430 End Time: 1500 Facilitators: Victorino Sparrow, LRT/CTRS Location: [place]  AAA/T Program Assumption of Risk Form signed by Patient/ or Parent Legal Guardian Yes  Patient understands his/her participation is voluntary Yes  Affect/Mood: N/A   Participation Level: Did not attend    Clinical Observations/Individualized Feedback: Pt did not attend group.    Plan: Continue to engage patient in RT group sessions 2-3x/week.   Victorino Sparrow, LRT/CTRS 11/27/2020 3:52 PM

## 2020-11-27 NOTE — BHH Group Notes (Signed)
[facility] Group Notes:  (Nursing/MHT/Case Management/Adjunct)  Date:  11/27/2020  Time:  11:10 AM  Type of Therapy:  Psychoeducational Skills  Participation Level:  Minimal  Participation Quality:  Appropriate  Affect:  Appropriate  Cognitive:  Appropriate  Insight:  Appropriate  Engagement in Group:  Lacking  Modes of Intervention:  Education  Summary of Progress/Problems:Patient attend part of the group. Patient was not engaged and mostly listened.   Jerrye Beavers 11/27/2020, 11:10 AM

## 2020-11-27 NOTE — BHH Counselor (Signed)
Adult Comprehensive Assessment  Patient ID: Janet Welch, female   DOB: 08-07-1975, 45 y.o.   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 Family Relationships: Pt reports no stressors Financial / Lack of resources (include bankruptcy): Pt reports no stressors Housing / Lack of housing: Pt reports living at home with her husband, 4 minor children, mother, and father Physical health (include injuries & life threatening diseases): Pt reports no stressors Social relationships: Pt reports no stressors Substance abuse: Pt denies all substance use Bereavement / Loss: Pt reports no stressors  Living/Environment/Situation:  Living Arrangements: Spouse/significant other, Children, Parent Living conditions (as described by patient or guardian): Own/Home Who else lives in the home?: Husband, 4 minor children, mother, father How long has patient lived in current situation?: 6 years What is atmosphere in current home: Comfortable, Supportive  Family History:  Marital status: Married Number of Years Married: 36 What types of issues is patient dealing with in the relationship?: "Just disagreements" Are you sexually active?: Yes What is your sexual orientation?: Heterosexual Has your sexual activity been affected by drugs, alcohol, medication, or emotional stress?: No Does patient have children?: Yes How many children?: 4 How is patient's relationship with their children?: "We have both good and bad days"  Childhood History:  By whom was/is the patient raised?: Mother, Father,  Grandparents Description of patient's relationship with caregiver when they were a child: "We all got along fine" Patient's description of current relationship with people who raised him/her: "We still get along fine but we all live together so there are some challenges" How were you disciplined when you got in trouble as a child/adolescent?: Groundings and spankings Does patient have siblings?: Yes Number of Siblings: 1 Description of patient's current relationship with siblings: "I have a brother and we get along [facility]" Did patient suffer any verbal/emotional/physical/sexual abuse as a child?: No (Pt reports that they are no sure at this time) Did patient suffer from severe childhood neglect?: No Has patient ever been sexually abused/assaulted/raped as an adolescent or adult?: No Was the patient ever a victim of a crime or a disaster?: No Witnessed domestic violence?: No Has patient been affected by domestic violence as an adult?: No  Education:  Highest grade of school patient has completed: 12th grade and Master Degree (Pt cannot remember what subject degree is in) Currently a student?: No Learning disability?: No  Employment/Work Situation:   Employment Situation: Employed 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, music How Long has Patient Been Employed?: 23 years Are You Satisfied With Your Job?: Yes Do You Work More Than One Job?: No Work Stressors: None Patient's Job has Been Impacted by Current Illness: No Describe how Patient's Job has Been Impacted: N/A What is the Longest Time Patient has Held a Job?: 16 years Where was the Patient Employed at that Time?: Doctors Office Has Patient ever Been in the [employer]?: No  Financial Resources:   Financial resources: Income from employment, Private insurance Does patient have a representative payee or guardian?: No  Alcohol/Substance Abuse:   What has been your use of drugs/alcohol within the  last 12 months?: Pt denies all  substance use If attempted suicide, did drugs/alcohol play a role in this?: No Alcohol/Substance Abuse Treatment Hx: Denies past history Has alcohol/substance abuse ever caused legal problems?: No  Social Support System:   Patient's Community Support System: Good Describe Community Support System: Family and friends Type of faith/religion: Spirituality How does patient's faith help to cope with current illness?: Education officer, community and church  Leisure/Recreation:   Do You Have Hobbies?: Yes Leisure and Hobbies: Anything being outdoors  Strengths/Needs:   What is the patient's perception of their strengths?: Being social and part of the community Patient states they can use these personal strengths during their treatment to contribute to their recovery: "I can be a part of something more then myself" Patient states these barriers may affect/interfere with their treatment: None Patient states these barriers may affect their return to the community: None Other important information patient would like considered in planning for their treatment: None  Discharge Plan:   Currently receiving community mental health services: No Patient states concerns and preferences for aftercare planning are: Pt is interested in therapy and medication management Patient states they will know when they are safe and ready for discharge when: "When I can get some sleep" Does patient have access to transportation?: Yes (Pt reports having her own car at home) Does patient have financial barriers related to discharge medications?: No Will patient be returning to same living situation after discharge?: Yes  Summary/Recommendations:   Summary and Recommendations (to be completed by the evaluator): Kathlene Yano is a 45 year old, female, who was admitted to the hospital due to Auditory Hallucinations, confusion, and a lack of sleep.  The Pt reports that she is living with her husband, 4 minor children (ages 83, 65, 46, and 74), her  mother, and father.  The Pt reports some family conflict due to living together but reports no other stressors.  She reports no childhood neglect but also states that she is not sure if there is any childhood trauma such as sexual or physical abuse.  The Pt reports that she is having difficulties remembering things and contributes this to her lack of sleep. 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  She also reports that she has a good support system of friends and family.  The Pt denies all substance use, as well as current and previous substance use treatment.  While in the hospital that Pt can benefit from crisis stabilization, medication evaluation, group therapy, psycho-education, case management, and discharge planning.  Upon discharge the Pt would like to return home with her husband and children and follow up with a local outpatient provider for therapy and medication management.  Darleen Crocker. 11/27/2020

## 2020-11-27 NOTE — BHH Group Notes (Signed)
[place] Group Notes:  (Nursing/MHT/Case Management/Adjunct)  Date:  11/27/2020  Time:  5:26 PM  Type of Therapy:  Group Therapy  Participation Level:  None  Participation Quality:  Attentive  Affect:  Blunted  Cognitive:  Appropriate  Insight:  None  Engagement in Group:  Lacking  Modes of Intervention:  Discussion  Summary of Progress/Problems:Patient attended group but was not engaged. Patient took the Anger Packet  to read at a later time.  Jerrye Beavers 11/27/2020, 5:26 PM

## 2020-11-27 NOTE — BHH Suicide Risk Assessment (Addendum)
[facility] Admission Suicide Risk Assessment   Nursing information obtained from:  Patient Demographic factors: Married, mother of 4 Current Mental Status:  recent mania, AH, and insomnia Loss Factors:  Financial problems / change in socioeconomic status, reported marriage stressors Historical Factors: prior psychiatric diagnoses/treatments, CBD use prior to admission Risk Reduction Factors:  Living with another person, especially a relative  Total Time Spent in Direct Patient Care:  I personally spent 60 minutes on the unit in direct patient care. The direct patient care time included face-to-face time with the patient, reviewing the patient's chart, communicating with other professionals, and coordinating care. Greater than 50% of this time was spent in counseling or coordinating care with the patient regarding goals of hospitalization, psycho-education, and discharge planning needs.  Principal Problem: Bipolar I disorder, current or most recent episode manic, severe with mood-congruent psychotic features ([place]) Diagnosis:  Principal Problem:   Bipolar I disorder, current or most recent episode manic, severe with mood-congruent psychotic features ([place])  Subjective Data: The patient is a 45y/o female with h/o bipolar I, who was originally admitted to [facility] for observation on 11/22/20 for hypomanic behaviors (pressured speech, difficulty sitting still, emotional lability, racing thoughts, increased energy, poor focus, and poor sleep). During that assessment, she was noted to have AH. She was started on Zyprexa 5mg  qhs and was observed overnight and discharged home on 11/23/20. On 11/25/20 the patient returned to [facility] for ongoing c/o AH, poor thought organization, ongoing poor sleep, and report from family of mood elevation. She was increased to Zyprexa 5mg  bid and transferred voluntarily to [facility] for continued stabilization.   On assessment today, the patient is a poor historian and is vague in her answers. She  appears to have difficulty organizing her thoughts to answer simple and direct questions making history gathering difficult. She states she does not feel she needs to be in the hospital and was brought in by family for help with poor sleep. She estimates she is getting 5-7 hours/night of sleep, but collateral obtained prior to admission was that she was only sleeping about 2 hours nightly. She admits that she has recently been "up doing things" instead of sleeping and attributes this to running a busy household. 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 She makes reference to marital stressors but will not discuss in detail. She admits that in the last 1-2 weeks she had a "burst of energy" and was more talkative, but when asked about other manic symptoms she cannot give details. She states today she feels "a little sad" and tearful because she wants to go home. She denies recent issues with anhedonia but states that in recent weeks she "has forgotten to eat" and has had restlessness and poor focus. When asked about AVH she states she is "unsure" and thinks she hears her own voice inside her head. Per collateral prior to admission, she had been seen talking to herself and at times laughing inappropriately. She denies thought broadcasting, ideas of reference, or magical thinking but admits she is "paranoid" at home. When asked to clarify she makes reference to "smelling pot in the house" which concerned her. She admits to belief in thought insertion/withdrawal and states she suspects her husband is doing this to her. She reports using CBD products episodically but denies other illicit  drug or ETOH use. She thinks she has been hospitalized 3 other times for bipolar d/o but cannot recall dates of treatment or past  medication trials other than Ambien and Zyprexa. According to her records, she was admitted in March 2019 for bipolar I MRE manic with psychotic features and was stabilized on Zyprexa 5mg  qhs. She denies current SI or HI and denies previous suicide attempts. See H&P for additional details.  CLINICAL FACTORS:   Previous Psychiatric Diagnoses and Treatments Bipolar with psychotic features dx  Musculoskeletal: Strength & Muscle Tone:  untested Gait & Station:  untested in bed Patient leans: N/A Psychiatric Specialty Exam: Physical Exam Vitals and nursing note reviewed.  HENT:     Head: Normocephalic.  Pulmonary:     Effort: Pulmonary effort is normal.  Neurological:     General: No focal deficit present.     Mental Status: She is alert.    Review of Systems - see H&P  Blood pressure 121/74, pulse 84, temperature 98.3 F (36.8 C), temperature source Oral, resp. rate 16, height 5\' 7"  (1.702 m), weight 86.2 kg, SpO2 97 %.Body mass index is 29.76 kg/m.  General Appearance:  casually dressed, adequate hygiene  Eye Contact:  Fair  Speech:  rambling  Volume:  Decreased  Mood:  Anxious  Affect:  Labile , guarded  Thought Process: scattered, evasive, vague, circumstantial  Orientation:  uncooperative for testing  Thought Content:   Makes vague reference to hearing her own thoughts and belief in thought insertion/withdrawal; makes some paranoid statements about spouse and belief she smelled THC in the home; is not grossly responding to internal/external stimuli on exam; denies ideas of reference or thought broadcasting  Suicidal Thoughts:   Denied  Homicidal Thoughts:   Denied  Memory:  Uncooperative for full testing - appears to have difficulty providing details to recent events  Judgement:  Impaired  Insight:  Lacking  Psychomotor Activity:  Normal  Concentration:  distracted - requiring redirection  Recall:   untested  Fund of Knowledge:  Fair  Language:  Fair  Akathisia:  Negative   Assets:  Communication Skills Desire for Improvement Housing Resilience Social Support Vocational/Educational  ADL's:  Intact  Cognition:  WNL  Sleep:  Number of Hours: 2.5    COGNITIVE FEATURES THAT CONTRIBUTE TO RISK:  Closed-mindedness and Thought constriction (tunnel vision)    SUICIDE RISK:   Mild:  There are no identifiable plans, no associated intent, few other risk factors, and identifiable protective factors, including available and accessible social support.  PLAN OF CARE: Patient admitted voluntarily to [facility]. Admission labs reviewed: Lipid panel WNL, TSH 1.801, SARs negative, UPT negative, UDS positive for THC, respiratory panel negative, ETOH <10, A1c 5.1, CMP WNL except for glucose 103, CBC WNL. EKG pending. Prolactin pending.  At this time, the patient has residual difficulty with thought organization and mood lability, and her symptoms as described prior to admission, appear most c/w bipolar mania with psychotic features. She continues to endorse some residual AH, paranoia, and belief in thought insertion/withdrawal. We will attempt to get collateral from family for additional details. Her UDS is positive for THC and she admits to CBD product use.   Patient will continue on Zyprexa 5mg  bid today and will consolidate to 10mg  qhs starting tomorrow due to c/o daytime sedation on present dose. Her PDMP report dose not show recent Xanax use so this medication will be discontinued.   I certify that inpatient services furnished can reasonably be expected to improve  the patient's condition.   Harlow Asa, MD, FAPA 11/27/2020, 2:40 PM

## 2020-11-28 ENCOUNTER — Encounter (HOSPITAL_COMMUNITY): Payer: Self-pay

## 2020-11-28 DIAGNOSIS — F312 Bipolar disorder, current episode manic severe with psychotic features: Secondary | ICD-10-CM | POA: Diagnosis not present

## 2020-11-28 LAB — PROLACTIN: Prolactin: 27.6 ng/mL — ABNORMAL HIGH (ref 4.8–23.3)

## 2020-11-28 NOTE — Progress Notes (Signed)
Adult Psychoeducational Group Note  Date:  11/28/2020 Time:  10:22 AM  Group Topic/Focus:  Goals Group:   The focus of this group is to help patients establish daily goals to achieve during treatment and discuss how the patient can incorporate goal setting into their daily lives to aide in recovery.  Participation Level:  Active  Participation Quality:  Appropriate  Affect:  Appropriate  Cognitive:  Appropriate  Insight: Appropriate  Engagement in Group:  Engaged  Modes of Intervention:  Discussion  Additional Comments:  Pt stated her goal for the day is to socialize with her peers.  Tonia Brooms D 11/28/2020, 10:22 AM

## 2020-11-28 NOTE — BH IP Treatment Plan (Signed)
Interdisciplinary Treatment and Diagnostic Plan Update  11/28/2020 Time of Session: 9:40am  CHARONDA HEFTER MRN: [id number]  Principal Diagnosis: Bipolar I disorder, current or most recent episode manic, severe with mood-congruent psychotic features ([place])  Secondary Diagnoses: Principal Problem:   Bipolar I disorder, current or most recent episode manic, severe with mood-congruent psychotic features ([place])   Current Medications:  Current Facility-Administered Medications  Medication Dose Route Frequency Provider Last Rate Last Admin   acetaminophen (TYLENOL) tablet 650 mg  650 mg Oral Q6H PRN Derrill Center, NP       alum & mag hydroxide-simeth (MAALOX/MYLANTA) 200-200-20 MG/5ML suspension 30 mL  30 mL Oral Q4H PRN Derrill Center, NP       benztropine (COGENTIN) tablet 0.5 mg  0.5 mg Oral BID PRN Harlow Asa, MD       hydrocortisone cream 1 %   Topical BID Harlow Asa, MD   1 application at 10/93/23 0814   hydrocortisone cream 1 %   Topical BID PRN Merrily Brittle, DO   Given at 11/27/20 1542   hydrOXYzine (ATARAX/VISTARIL) tablet 25 mg  25 mg Oral Q6H PRN Harlow Asa, MD   25 mg at 11/27/20 2133   OLANZapine zydis (ZYPREXA) disintegrating tablet 5 mg  5 mg Oral Q8H PRN Harlow Asa, MD       And   LORazepam (ATIVAN) tablet 1 mg  1 mg Oral PRN Harlow Asa, MD       And   ziprasidone (GEODON) injection 20 mg  20 mg Intramuscular PRN Nelda Marseille, Amy E, MD       magnesium hydroxide (MILK OF MAGNESIA) suspension 30 mL  30 mL Oral Daily PRN Derrill Center, NP       OLANZapine zydis (ZYPREXA) disintegrating tablet 10 mg  10 mg Oral QHS Nelda Marseille, Amy E, MD       traZODone (DESYREL) tablet 50 mg  50 mg Oral QHS PRN Derrill Center, NP   50 mg at 11/27/20 2133   PTA Medications: Medications Prior to Admission  Medication Sig Dispense Refill Last Dose   hydrOXYzine (ATARAX/VISTARIL) 25 MG tablet Take 1 tablet (25 mg total) by mouth 3 (three) times daily as needed for  anxiety. 30 tablet 1    OLANZapine zydis (ZYPREXA) 5 MG disintegrating tablet Take 1 tablet (5 mg total) by mouth at bedtime. 30 tablet 1    traZODone (DESYREL) 50 MG tablet Take 1 tablet (50 mg total) by mouth at bedtime as needed for sleep. 30 tablet 1     Patient Stressors: Financial difficulties   Health problems   Marital or family conflict    Patient Strengths: Ability for insight  Communication skills  Supportive family/friends   Treatment Modalities: Medication Management, Group therapy, Case management,  1 to 1 session with clinician, Psychoeducation, Recreational therapy.   Physician Treatment Plan for Primary Diagnosis: Bipolar I disorder, current or most recent episode manic, severe with mood-congruent psychotic features ([place]) Long Term Goal(s): Improvement in symptoms so as ready for discharge   Short Term Goals: Ability to identify changes in lifestyle to reduce recurrence of condition will improve Ability to verbalize feelings will improve Ability to disclose and discuss suicidal ideas Ability to identify and develop effective coping behaviors will improve Compliance with prescribed medications will improve Ability to identify triggers associated with substance abuse/mental health issues will improve  Medication Management: Evaluate patient's response, side effects, and tolerance of medication regimen.  Therapeutic Interventions: 1  to 1 sessions, Unit Group sessions and Medication administration.  Evaluation of Outcomes: Not Met  Physician Treatment Plan for Secondary Diagnosis: Principal Problem:   Bipolar I disorder, current or most recent episode manic, severe with mood-congruent psychotic features ([place])  Long Term Goal(s): Improvement in symptoms so as ready for discharge   Short Term Goals: Ability to identify changes in lifestyle to reduce recurrence of condition will improve Ability to verbalize feelings will improve Ability to disclose and discuss  suicidal ideas Ability to identify and develop effective coping behaviors will improve Compliance with prescribed medications will improve Ability to identify triggers associated with substance abuse/mental health issues will improve     Medication Management: Evaluate patient's response, side effects, and tolerance of medication regimen.  Therapeutic Interventions: 1 to 1 sessions, Unit Group sessions and Medication administration.  Evaluation of Outcomes: Not Met   RN Treatment Plan for Primary Diagnosis: Bipolar I disorder, current or most recent episode manic, severe with mood-congruent psychotic features ([place]) Long Term Goal(s): Knowledge of disease and therapeutic regimen to maintain health will improve  Short Term Goals: Ability to remain free from injury will improve, Ability to participate in decision making will improve, Ability to verbalize feelings will improve, Ability to disclose and discuss suicidal ideas, and Ability to identify and develop effective coping behaviors will improve  Medication Management: RN will administer medications as ordered by provider, will assess and evaluate patient's response and provide education to patient for prescribed medication. RN will report any adverse and/or side effects to prescribing provider.  Therapeutic Interventions: 1 on 1 counseling sessions, Psychoeducation, Medication administration, Evaluate responses to treatment, Monitor vital signs and CBGs as ordered, Perform/monitor CIWA, COWS, AIMS and Fall Risk screenings as ordered, Perform wound care treatments as ordered.  Evaluation of Outcomes: Not Met   LCSW Treatment Plan for Primary Diagnosis: Bipolar I disorder, current or most recent episode manic, severe with mood-congruent psychotic features ([place]) Long Term Goal(s): Safe transition to appropriate next level of care at discharge, Engage patient in therapeutic group addressing interpersonal concerns.  Short Term Goals: Engage  patient in aftercare planning with referrals and resources, Increase social support, Increase emotional regulation, Facilitate acceptance of mental health diagnosis and concerns, Identify triggers associated with mental health/substance abuse issues, and Increase skills for wellness and recovery  Therapeutic Interventions: Assess for all discharge needs, 1 to 1 time with Social worker, Explore available resources and support systems, Assess for adequacy in community support network, Educate family and significant other(s) on suicide prevention, Complete Psychosocial Assessment, Interpersonal group therapy.  Evaluation of Outcomes: Not Met   Progress in Treatment: Attending groups: Yes. Participating in groups: Yes. Taking medication as prescribed: Yes. Toleration medication: Yes. Family/Significant other contact made: Yes, individual(s) contacted:  Brother  Patient understands diagnosis: No. Discussing patient identified problems/goals with staff: Yes. Medical problems stabilized or resolved: Yes. Denies suicidal/homicidal ideation: Yes. Issues/concerns per patient self-inventory: No.   New problem(s) identified: No, Describe:  None   New Short Term/Long Term Goal(s): medication stabilization, elimination of SI thoughts, development of comprehensive mental wellness plan.   Patient Goals: "To be more social and to go home"   Discharge Plan or Barriers: Patient recently admitted. CSW will continue to follow and assess for appropriate referrals and possible discharge planning.   Reason for Continuation of Hospitalization: Depression Medication stabilization Suicidal ideation  Estimated Length of Stay: 3 to 5 days    Scribe for Treatment Team: Darleen Crocker, Latanya Presser 11/28/2020 2:22 PM

## 2020-11-28 NOTE — Progress Notes (Signed)
D:  Patient's self inventory sheet, patient sleeps good, no sleep medication.  Good appetite, low energy level, good concentration.  Rated depression 2, denied hopeless, anxiety 5.  Denied withdrawals.  Denied SI.  Physical problems denied.  Physical pain, worst pain #1, R forearm.  Goal is discharge.  Plans to take one day at a time.  Does have discharge plan. A:  Medications administered per MD orders.  Emotional support and encouragement given patient. R:  Denied SI and HI, contracts for safety.  Denied A/V hallucinations.  Safety maintained with 15 minute checks.

## 2020-11-28 NOTE — Progress Notes (Signed)
   11/27/20 2133  Psych Admission Type (Psych Patients Only)  Admission Status Involuntary  Psychosocial Assessment  Patient Complaints Anxiety  Eye Contact Fair  Facial Expression Flat  Affect Appropriate to circumstance  Speech Logical/coherent;Soft  Interaction Cautious;Minimal;Guarded  Motor Activity Other (Comment) (WDL)  Appearance/Hygiene Unremarkable  Behavior Characteristics Cooperative  Mood Anxious  Thought Process  Coherency WDL  Content WDL  Delusions None reported or observed  Perception Hallucinations  Hallucination Auditory  Judgment Impaired  Confusion None  Danger to Self  Current suicidal ideation? Denies  Danger to Others  Danger to Others None reported or observed

## 2020-11-28 NOTE — Progress Notes (Addendum)
[facility] Progress Note  11/28/2020 1:30 PM Janet Welch  MRN:  [id number]  Chief Complaint: mania  Subjective:  Patient said, "I am feeling better. I have a little anxiety this morning about my children and things going on at my house."  Patient said she feels calm today. She states she almost feels as though she needs to take a nap again. She feels less confused and "foggy" compared to how she felt yesterday. She says Zyprexa has been helping her sleep and mood and she is in agreement with the current treatment plan in place. She says she still feels anxious because she feels she needs to always be at home with her children. She is worried that they have all of these activities to do and work for homeschool to complete, but she is not there to help. Patient says she is especially worried about her 44 year old boy and 44 year old girl. She says they are both at puberty stage. She said her husband works at home and she is also usually home. She says her husband did not have a good relationship with his dad, and she is worried that she needs to be there with her teenage son to step in if there conversations are not going well. 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 She believes that her family will need family counseling that involves everyone in the future  Patient denied SI/HI. She does not believe anyone is inserting thoughts into her head today and denies paranoia. She describes a sense of spiritual comfort from her faith but denies having a need to fast or pray, getting messages from God, or having hyper-religious thinking today. She denies ideas of reference, first rank symptoms, or VH. She denied racing thoughts or grandiosity. She reports hearing her own internal thoughts but denies other AH or command voices. She has been sleeping and eating well. She says her right forearm rash is still itching, but the  area has improved in appearance. She denied chest pain, SOB, difficulty going to the bathroom. When questioned about the [facility] in her UDS, she again admits to CBD gummy use and states she did think she had smelled THC coming from the crawl space in her home prior to admission. She was again counseled on the need to abstain from [facility] products after dsicharge.   Principal Problem: Bipolar affective disorder, current episode manic with psychotic symptoms ([place]) Diagnosis: Principal Problem:   Bipolar affective disorder, current episode manic with psychotic symptoms ([place])  Total Time Spent in Direct Patient Care:  I personally spent 30 minutes on the unit in direct patient care. The direct patient care time included face-to-face time with the patient, reviewing the patient's chart, communicating with other professionals, and coordinating care. Greater than 50% of this time was spent in counseling or coordinating care with the patient regarding goals of hospitalization, psycho-education, and discharge planning needs.  Past Psychiatric History: See H&P  Past Medical History:  Past Medical History:  Diagnosis Date   No pertinent past medical history     Past Surgical History:  Procedure Laterality Date   CESAREAN SECTION N/A 07/07/2013   Procedure: CESAREAN SECTION;  Surgeon: Linda Hedges, DO;  Location: [facility];  Service: Obstetrics;  Laterality: N/A;   LEG SKIN LESION  BIOPSY / EXCISION     scar tissue biopsy     Family History:  Family History  Problem Relation Age  of Onset   Thyroid disease Mother    Parkinson's disease Father    Family Psychiatric  History: See H&P   Social History:  Social History   Substance and Sexual Activity  Alcohol Use Yes   Alcohol/week: 0.0 - 1.0 standard drinks     Social History   Substance and Sexual Activity  Drug Use Yes   Types: Marijuana    Social History   Socioeconomic History   Marital status: Married    Spouse name: Not on file   Number  of children: Not on file   Years of education: Not on file   Highest education level: Not on file  Occupational History   Not on file  Tobacco Use   Smoking status: Never   Smokeless tobacco: Never  Vaping Use   Vaping Use: Never used  Substance and Sexual Activity   Alcohol use: Yes    Alcohol/week: 0.0 - 1.0 standard drinks   Drug use: Yes    Types: Marijuana   Sexual activity: Yes  Other Topics Concern   Not on file  Social History Narrative   Not on file   Social Determinants of Health   Financial Resource Strain: Not on file  Food Insecurity: Not on file  Transportation Needs: Not on file  Physical Activity: Not on file  Stress: Not on file  Social Connections: Not on file        Sleep: Good  Appetite:  Good  Current Medications: Current Facility-Administered Medications  Medication Dose Route Frequency Provider Last Rate Last Admin   acetaminophen (TYLENOL) tablet 650 mg  650 mg Oral Q6H PRN Derrill Center, NP       alum & mag hydroxide-simeth (MAALOX/MYLANTA) 200-200-20 MG/5ML suspension 30 mL  30 mL Oral Q4H PRN Derrill Center, NP       benztropine (COGENTIN) tablet 0.5 mg  0.5 mg Oral BID PRN Harlow Asa, MD       hydrocortisone cream 1 %   Topical BID Harlow Asa, MD   1 application at 82/99/37 0814   hydrocortisone cream 1 %   Topical BID PRN Merrily Brittle, DO   Given at 11/27/20 1542   hydrOXYzine (ATARAX/VISTARIL) tablet 25 mg  25 mg Oral Q6H PRN Harlow Asa, MD   25 mg at 11/27/20 2133   OLANZapine zydis (ZYPREXA) disintegrating tablet 5 mg  5 mg Oral Q8H PRN Harlow Asa, MD       And   LORazepam (ATIVAN) tablet 1 mg  1 mg Oral PRN Harlow Asa, MD       And   ziprasidone (GEODON) injection 20 mg  20 mg Intramuscular PRN Nelda Marseille, Avarey Yaeger E, MD       magnesium hydroxide (MILK OF MAGNESIA) suspension 30 mL  30 mL Oral Daily PRN Derrill Center, NP       OLANZapine zydis (ZYPREXA) disintegrating tablet 10 mg  10 mg Oral QHS  Nelda Marseille, Tenita Cue E, MD       traZODone (DESYREL) tablet 50 mg  50 mg Oral QHS PRN Derrill Center, NP   50 mg at 11/27/20 2133    Lab Results:  Results for orders placed or performed during the hospital encounter of 11/26/20 (from the past 48 hour(s))  TSH     Status: None   Collection Time: 11/27/20  6:09 AM  Result Value Ref Range   TSH 1.801 0.350 - 4.500 uIU/mL    Comment: Performed by a  3rd Generation assay with a functional sensitivity of <=0.01 uIU/mL. Performed at [facility], [place] [address] [place], [place] [address]   Prolactin     Status: Abnormal   Collection Time: 11/27/20  6:09 AM  Result Value Ref Range   Prolactin 27.6 (H) 4.8 - 23.3 ng/mL    Comment: (NOTE) Performed At: [facility] [place], [place] [id number] Rush Farmer MD [id number]   Lipid panel     Status: None   Collection Time: 11/27/20  6:09 AM  Result Value Ref Range   Cholesterol 155 0 - 200 mg/dL   Triglycerides 67 <150 mg/dL   HDL 63 >40 mg/dL   Total CHOL/HDL Ratio 2.5 RATIO   VLDL 13 0 - 40 mg/dL   LDL Cholesterol 79 0 - 99 mg/dL    Comment:        Total Cholesterol/HDL:CHD Risk Coronary Heart Disease Risk Table                     Men   Women  1/2 Average Risk   3.4   3.3  Average Risk       5.0   4.4  2 X Average Risk   9.6   7.1  3 X Average Risk  23.4   11.0        Use the calculated Patient Ratio above and the CHD Risk Table to determine the patient's CHD Risk.        ATP III CLASSIFICATION (LDL):  <100     mg/dL   Optimal  100-129  mg/dL   Near or Above                    Optimal  130-159  mg/dL   Borderline  160-189  mg/dL   High  >190     mg/dL   Very High Performed at [facility] [address], [place], [place] [address]     Blood Alcohol level:  Lab Results  Component Value Date   ETH <10 11/22/2020   ETH <10 88/41/6606    Metabolic Disorder Labs: Lab Results  Component Value Date   HGBA1C  5.1 11/22/2020   MPG 99.67 11/22/2020   MPG 103 07/14/2018   Lab Results  Component Value Date   PROLACTIN 27.6 (H) 11/27/2020   PROLACTIN 31.8 (H) 04/02/2017   Lab Results  Component Value Date   CHOL 155 11/27/2020   TRIG 67 11/27/2020   HDL 63 11/27/2020   CHOLHDL 2.5 11/27/2020   VLDL 13 11/27/2020   LDLCALC 79 11/27/2020   LDLCALC 70 11/22/2020    Musculoskeletal: Strength & Muscle Tone: within normal limits Gait & Station: normal Patient leans: N/A  Psychiatric Specialty Exam:  Presentation  General Appearance: Appropriate for Environment  Eye Contact:Good  Speech:clear, coherent, more fluent  Speech Volume:Normal  Handedness:Left   Mood and Affect  Mood:Calm for most of interview but anxious appearing when talking about her children  Affect:Congruent   Thought Process  Thought Processes:Coherent, less tangential and more organized overall  Descriptions of Associations:Intact  Orientation:Full (Time, Place and Person), she was able to name the day month, year  Thought Content:Logical - denies ideas of reference, first rank symptoms, paranoia, AVH, or hyper-religious thinking today; does not make delusional statements  History of Schizophrenia/Schizoaffective disorder:No  Duration of Psychotic Symptoms: N/A  Hallucinations:Internal self-dialogue - said "I talk to myself in my head the way other mothers  I know do."  Ideas of Reference:Denied  Suicidal Thoughts:Denied Homicidal Thoughts:Denied  Sensorium  Memory:Recent Good; Immediate Good; Remote Fair  Judgment:Fair  Insight:Fair   Executive Functions  Concentration:Good, improved from yesterday  Attention Span:Good  Becker  Language:Good   Psychomotor Activity  Psychomotor Activity:No data recorded  Assets  Assets:Communication Skills; Financial Resources/Insurance; Housing; Intimacy; Resilience; Social Support   Sleep   Sleep:Good  Physical Exam Vitals reviewed.  Constitutional:      Appearance: Normal appearance.  HENT:     Head: Normocephalic and atraumatic.  Pulmonary:     Effort: Pulmonary effort is normal.  Skin:    Comments: Area of erythema and swelling of right forearm has improved in appearance. Still ~ 5cm X 5cm  Neurological:     General: No focal deficit present.     Mental Status: She is alert and oriented to person, place, and time.   Review of Systems  Constitutional:  Positive for malaise/fatigue. Negative for chills and fever.  Respiratory:  Negative for cough and shortness of breath.   Cardiovascular:  Negative for chest pain.  Gastrointestinal:  Negative for abdominal pain, nausea and vomiting.  Skin:  Positive for itching and rash.       Right forearm hypersensitivity reaction  Blood pressure 127/87, pulse 80, temperature 98.1 F (36.7 C), temperature source Oral, resp. rate 16, height 5\' 7"  (1.702 m), weight 86.2 kg, SpO2 97 %. Body mass index is 29.76 kg/m.   Treatment Plan Summary: Daily contact with patient to assess and evaluate symptoms and progress in treatment   ASSESSMENT: Principal Problem: Bipolar d/o manic with psychotic features  Bipolar d/o MRE manic with psychotic features Patient less drowsy-appearing today and able to share her thoughts more effectively and in far more organized fashion.  Zyprexa (5mg  BID) was started prior to inpatient admission and patient has been improving. Spoke with brother yesterday, and he expressed that patient improves then stops taking medications. Counseled patient on the importance of continuing medication even after episode of mania is over. - Starting Zyprexa 10mg  qhs today as consolidated qhs dose to help reduce daytime sedation (rechecking EKG for QTC monitoring; Lipid panel WNL and A1c 5.1)  - Cogentin 0.5mg  bid PRN EPS - Vistaril 25mg  q6 hours PRN anxiety - Trazodone 50mg  qhs PRN insomnia   CBD use - episodic (R/o  cannabis use d/o) - UDS positive for THC and patient admits to CBD use - counseled on need to abstain from use. Counseled that this could increase anxiety and psychosis as well as contribute to mania or depressive episodes.   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 - Vistaril 25mg  q6 hours PRN itch - Hydrocortisone 1% cream BID  Elevated Prolactin 27.6 - will advise patient to monitor for signs of galactorrhea and trend lab - currently asymptomatic on Zyprexa   Safety and Monitoring: Voluntary admission to inpatient psychiatric unit for safety, stabilization and treatment Daily contact with patient to assess and evaluate symptoms and progress in treatment Patient's case to be discussed in multi-disciplinary team meeting Observation Level : q15 minute checks Vital signs: q12 hours Precautions: suicide, elopement, and assault   Discharge Planning: Social work and case management to assist with discharge planning and identification of hospital follow-up needs prior to discharge. Estimated LOS: 4-5 days Discharge Concerns: Need to establish a safety plan; Medication compliance and effectiveness;  Discharge Goals: Return home with outpatient referrals for mental health follow-up including medication  management/psychotherapy;  Dispo: Home to family     Short Term Goals: Ability to identify changes in lifestyle to reduce recurrence of condition will improve, Ability to verbalize feelings will improve, Ability to disclose and discuss suicidal ideas, Ability to identify and develop effective coping behaviors will improve, Compliance with prescribed medications will improve, and Ability to identify triggers associated with substance abuse/mental health issues will improve   Physician Treatment Plan for Secondary Diagnosis: Active Problems:   Bipolar affective disorder   Long Term Goal(s): Improvement in symptoms so as ready for discharge   I  certify that inpatient services furnished can reasonably be expected to improve the patient's condition.      Shea Evans, MS-3 11/28/2020, 1:30 PM  Attestation for Student Documentation:   I certify that I saw and interviewed the patient together with the medical student and was present for the duration of the interview.  I reviewed the medical record.  I performed or reperformed the mental status examination of the patient as indicated.  I formulated the assessment and plan of treatment as documented above. Viann Fish, MD, Alda Ponder

## 2020-11-28 NOTE — Plan of Care (Signed)
Nurse discussed anxiety, depression and coping skills with patient.  

## 2020-11-28 NOTE — Progress Notes (Signed)
Recreation Therapy Notes  Date: 11.9.22 Time: 0930-1000 Location: [address] Dayroom  Group Topic: Stress Management   Goal Area(s) Addresses:  Patient will actively participate in stress management techniques presented during session.  Patient will successfully identify benefit of practicing stress management post d/c.   Intervention: Relaxation exercise with ambient sound and script   Activity: Guided Imagery. LRT provided education, instruction, and demonstration on practice of visualization via guided imagery. Patient was asked to participate in the technique introduced during session. LRT debriefed including topics of mindfulness, stress management and specific scenarios each patient could use these techniques. Patients were given suggestions of ways to access scripts post d/c and encouraged to explore Youtube and other apps available on smartphones, tablets, and computers.  Education:  Stress Management, Discharge Planning.   Clinical Observations/Feedback: Group did not occur due to orientation group going over 20 minutes.  Packets were given with information on ways to manage stress, techniques that can be used, proper breathing techniques and how to properly do the techniques given such as progressive muscle relaxation, diaphragmatic breathing and mindfulness meditation.     Victorino Sparrow, LRT, CTRS        Ria Comment, Dejah Droessler A 11/28/2020 11:59 AM

## 2020-11-28 NOTE — Group Note (Signed)
LCSW Group Therapy Note   Group Date: 11/28/2020 Start Time: 1300 End Time: 1400   Type of Therapy and Topic:  Group Therapy:   Participation Level:  Did Not Attend  Description of Group:Group encouraged increased engagement and participation through discussion focused on Safety Planning. Patients worked both individually and collaboratively to create and discuss the different elements of a safety plan, including identifying warning signs, coping skills, professional supports, people you can ask for help, how to make the environment safe, and reasons for life worth living. Remainder of group was spent filling out individual safety plans to be placed in patient charts.   Therapeutic Goal(s): Identify warning signs and triggers Identify positive coping strategies Identify professional and personal supports when experiencing a mental health crisis Identify ways in which you can make the environment safe Identify reasons for life worth living Identify the steps to completing a safety plan and provide education on completing a safety plan at discharge   Summary of Patient Progress:  Did Not Attend   Mliss Fritz, Latanya Presser 11/28/2020  1:30 PM

## 2020-11-29 DIAGNOSIS — F312 Bipolar disorder, current episode manic severe with psychotic features: Secondary | ICD-10-CM | POA: Diagnosis not present

## 2020-11-29 MED ORDER — OLANZAPINE 15 MG PO TBDP
7.5000 mg | ORAL_TABLET | Freq: Every day | ORAL | Status: DC
  Administered 2020-11-29 – 2020-11-30 (×2): 7.5 mg via ORAL
  Filled 2020-11-29: qty 0.5
  Filled 2020-11-29: qty 1
  Filled 2020-11-29 (×2): qty 0.5
  Filled 2020-11-29: qty 1
  Filled 2020-11-29: qty 0.5

## 2020-11-29 NOTE — [facility] Group Notes (Signed)
Pt did not attend group. 

## 2020-11-29 NOTE — [facility] Group Notes (Signed)
Relaxation and Psycho-ed Group were combined. Healthy coping skills were discussed as it attributes to promoting mental well being. Discussed with patients that mental health journey is like a pie, and that healthy coping skills add value to mental well being. Identified unhealthy coping skills and how they play negative impact, and identified positive healthy coping skills that are free, accessible and easily implemented. Closed group with deep breathing practice and education regarding stress reduction. Pt attended, minimal participation.

## 2020-11-29 NOTE — Plan of Care (Signed)
Patient has been sleeping for the majority of the shift. Reported feeling tired and needing to rest. No sign of distress. Alert and orientd x 4. Denied SI/HI/AVH. Her appetite is fair.

## 2020-11-29 NOTE — Plan of Care (Signed)
  Problem: Education: Goal: Emotional status will improve Outcome: Not Progressing Goal: Mental status will improve Outcome: Not Progressing Goal: Verbalization of understanding the information provided will improve Outcome: Not Progressing   

## 2020-11-29 NOTE — Progress Notes (Addendum)
[facility]  Progress Note  11/29/2020 11:49 AM Janet Welch  MRN:  [id number]  Subjective:  Patient said she is doing better today in terms of her mood but is having some residual sedation today. She said she slept good but woke up and has felt very groggy this morning. She made it her goal to interact with others more yesterday and was successful. She did attend group today. She denied SI/HI/AVH, hyper-religious ideas, paranoia, ideas of reference, first rank symptoms, or delusions. She said she is not having any physical symptoms other than blurry vision. Her right forearm rash is continuing to improve and appears less red but is still itching. Patient is excited to get home to her family soon. She spent time discussing recent stressors raising 4 kids, homeschooling, feeling disconnected from her spouse, and not feeling validated with what she does at home. She said she had counseling in the past, and she believes this worked well for her. She is interested in family and marriage therapy after discharge.  Principal Problem: Bipolar I disorder, current or most recent episode manic, severe with mood-congruent psychotic features ([place]) Diagnosis: Principal Problem:   Bipolar I disorder, current or most recent episode manic, severe with mood-congruent psychotic features ([place])  Total Time Spent in Direct Patient Care:  I personally spent 30 minutes on the unit in direct patient care. The direct patient care time included face-to-face time with the patient, reviewing the patient's chart, communicating with other professionals, and coordinating care. Greater than 50% of this time was spent in counseling or coordinating care with the patient regarding goals of hospitalization, psycho-education, and discharge planning needs.  Past Psychiatric History: See H&P  Past Medical History:  Past Medical History:  Diagnosis Date   No pertinent past medical history     Past Surgical History:  Procedure Laterality Date    CESAREAN SECTION N/A 07/07/2013   Procedure: CESAREAN SECTION;  Surgeon: Linda Hedges, DO;  Location: [facility];  Service: Obstetrics;  Laterality: N/A;   LEG SKIN LESION  BIOPSY / EXCISION     scar tissue biopsy     Family History:  Family History  Problem Relation Age of Onset   Thyroid disease Mother    Parkinson's disease Father    Family Psychiatric  History: See H&P  Social History:  Social History   Substance and Sexual Activity  Alcohol Use Yes   Alcohol/week: 0.0 - 1.0 standard drinks     Social History   Substance and Sexual Activity  Drug Use Yes   Types: Marijuana    Social History   Socioeconomic History   Marital status: Married    Spouse name: Not on file   Number of children: Not on file   Years of education: Not on file   Highest education level: Not on file  Occupational History   Not on file  Tobacco Use   Smoking status: Never   Smokeless tobacco: Never  Vaping Use   Vaping Use: Never used  Substance and Sexual Activity   Alcohol use: Yes    Alcohol/week: 0.0 - 1.0 standard drinks   Drug use: Yes    Types: Marijuana   Sexual activity: Yes  Other Topics Concern   Not on file  Social History Narrative   Not on file   Social Determinants of Health   Financial Resource Strain: Not on file  Food Insecurity: Not on file  Transportation Needs: Not on file  Physical Activity: Not on file  Stress:  Not on file  Social Connections: Not on file   Sleep: Good, patient said she is feeling very groggy when she wakes up  Appetite:  Good  Current Medications: Current Facility-Administered Medications  Medication Dose Route Frequency Provider Last Rate Last Admin   acetaminophen (TYLENOL) tablet 650 mg  650 mg Oral Q6H PRN Derrill Center, NP       alum & mag hydroxide-simeth (MAALOX/MYLANTA) 200-200-20 MG/5ML suspension 30 mL  30 mL Oral Q4H PRN Derrill Center, NP       benztropine (COGENTIN) tablet 0.5 mg  0.5 mg Oral BID PRN Harlow Asa,  MD       hydrocortisone cream 1 %   Topical BID Harlow Asa, MD   Given at 11/29/20 0277   hydrocortisone cream 1 %   Topical BID PRN Merrily Brittle, DO   Given at 11/27/20 1542   hydrOXYzine (ATARAX/VISTARIL) tablet 25 mg  25 mg Oral Q6H PRN Harlow Asa, MD   25 mg at 11/28/20 2100   OLANZapine zydis (ZYPREXA) disintegrating tablet 5 mg  5 mg Oral Q8H PRN Harlow Asa, MD       And   LORazepam (ATIVAN) tablet 1 mg  1 mg Oral PRN Harlow Asa, MD       And   ziprasidone (GEODON) injection 20 mg  20 mg Intramuscular PRN Nelda Marseille, Jaterrius Ricketson E, MD       magnesium hydroxide (MILK OF MAGNESIA) suspension 30 mL  30 mL Oral Daily PRN Derrill Center, NP       OLANZapine zydis (ZYPREXA) disintegrating tablet 10 mg  10 mg Oral QHS Nelda Marseille, Nikolaj Geraghty E, MD   10 mg at 11/28/20 2100   traZODone (DESYREL) tablet 50 mg  50 mg Oral QHS PRN Derrill Center, NP   50 mg at 11/28/20 2100    Lab Results: No results found for this or any previous visit (from the past 48 hour(s)).  Blood Alcohol level:  Lab Results  Component Value Date   ETH <10 11/22/2020   ETH <10 41/28/7867    Metabolic Disorder Labs: Lab Results  Component Value Date   HGBA1C 5.1 11/22/2020   MPG 99.67 11/22/2020   MPG 103 07/14/2018   Lab Results  Component Value Date   PROLACTIN 27.6 (H) 11/27/2020   PROLACTIN 31.8 (H) 04/02/2017   Lab Results  Component Value Date   CHOL 155 11/27/2020   TRIG 67 11/27/2020   HDL 63 11/27/2020   CHOLHDL 2.5 11/27/2020   VLDL 13 11/27/2020   LDLCALC 79 11/27/2020   LDLCALC 70 11/22/2020    Physical Findings: AIMS: Facial and Oral Movements Muscles of Facial Expression: None, normal Lips and Perioral Area: None, normal Jaw: None, normal Tongue: None, normal,Extremity Movements Upper (arms, wrists, hands, fingers): None, normal Lower (legs, knees, ankles, toes): None, normal, Trunk Movements Neck, shoulders, hips: None, normal, Overall Severity Severity of abnormal  movements (highest score from questions above): None, normal Incapacitation due to abnormal movements: None, normal Patient's awareness of abnormal movements (rate only patient's report): No Awareness, Dental Status Current problems with teeth and/or dentures?: No Does patient usually wear dentures?: No  CIWA:   N/A COWS:   N/A  Musculoskeletal: Strength & Muscle Tone: within normal limits Gait & Station: normal Patient leans: N/A  Psychiatric Specialty Exam:  Presentation  General Appearance: Appropriate for Environment, casual  Eye Contact:Good  Speech:Clear and Coherent  Speech Volume:Normal  Handedness:Left   Mood and  Affect  Mood:described as improving but sleepy   Affect:calm, polite, but mildly sedated appearing   Thought Process  Thought Processes:Linear and less circumstantial and no longer tangential  Descriptions of Associations:Intact  Orientation:Full (Time, Place and Person)  Thought Content: Denies paranoia, ideas of reference, first rank symptoms, AVH or delusions - no evidence of response to internal/external stimuli on exam; no hyper-religious references today  History of Schizophrenia/Schizoaffective disorder:No Duration of Psychotic Symptoms:-- (unknown)  Hallucinations:Denied Ideas of Reference:Denied  Suicidal Thoughts:Denied Homicidal Thoughts:Denied  Sensorium  Memory:Recent Good; Immediate Good; Remote Fair  Judgment:Fair  Insight:Fair   Executive Functions  Concentration:Fair  Attention Span:Fair  [place] of Knowledge:Good  Language:Good   Psychomotor Activity  Psychomotor Activity:No agitation or retardation; no cogwheeling, no stiffness, no tremor; AIMS 0  Assets  Assets:Communication Skills; Financial Resources/Insurance; Housing; Intimacy; Resilience; Social Support   Sleep  Sleep:5 hours   Physical Exam Vitals and nursing note reviewed.  Constitutional:      Appearance: Normal appearance. She  is not ill-appearing.  HENT:     Head: Normocephalic and atraumatic.  Pulmonary:     Effort: Pulmonary effort is normal.  Skin:    General: Skin is warm and dry.  Neurological:     Mental Status: She is alert and oriented to person, place, and time.   Review of Systems  Constitutional:  Positive for malaise/fatigue. Negative for chills.  Eyes:  Positive for blurred vision.  Respiratory:  Negative for cough and shortness of breath.   Cardiovascular:  Negative for chest pain.  Gastrointestinal:  Negative for constipation, nausea and vomiting.  Genitourinary:  Negative for dysuria.  Musculoskeletal:  Negative for myalgias.  Skin:  Positive for itching and rash.       Right forearm rash with localized itching.  Neurological:  Negative for dizziness and headaches.  Endo/Heme/Allergies:        Negative for nipple discharge.  All other systems reviewed and are negative. Blood pressure 125/81, pulse 80, temperature 98.2 F (36.8 C), temperature source Oral, resp. rate 16, height 5\' 7"  (1.702 m), weight 86.2 kg, SpO2 97 %. Body mass index is 29.76 kg/m.   Treatment Plan Summary: Daily contact with patient to assess and evaluate symptoms and progress in treatment   ASSESSMENT: Principal Problem: Bipolar d/o manic with psychotic features   Bipolar d/o MRE manic with psychotic features Patient very drowsy-appearing today but still able to share her thoughts effectively in an organized fashion. Zyprexa (5mg  BID) was started prior to inpatient admission, and this was changed to 10mg  qhs. Patient has been improving on Zyprexa, but given drowsiness and improvement in mania/psychosis, will decrease dosage. - Decreasing Zyprexa to 7.5mg  qhs today to help reduce daytime sedation (QTC 459ms; Lipid panel WNL and A1c 5.1)  - Cogentin 0.5mg  bid PRN EPS - Vistaril 25mg  q6 hours PRN anxiety - Trazodone 50mg  qhs PRN insomnia - Contacting family to see if patient is nearing clinical baseline   CBD  use - episodic (R/o cannabis use d/o) - UDS positive for THC and patient admits to CBD use - counseled on need to abstain from use. Counseled that this could increase anxiety and psychosis as well as contribute to mania or depressive episodes.   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 Patient says right forearm is still itching but appearance improving. - Vistaril 25mg  q6 hours PRN itch - Hydrocortisone 1% cream BID   Elevated Prolactin 27.6 - Advised patient to monitor for galactorrhea and will trend lab -  currently asymptomatic on Zyprexa - discussed that pituitary gland issue is in differential but most likely is related to use of atypical antipsychotic - will need f/u with PCP after discharge  Safety and Monitoring: Voluntary admission to inpatient psychiatric unit for safety, stabilization and treatment Daily contact with patient to assess and evaluate symptoms and progress in treatment Patient's case to be discussed in multi-disciplinary team meeting Observation Level : q15 minute checks Vital signs: q12 hours Precautions: suicide, elopement, and assault   Discharge Planning: Social work and case management to assist with discharge planning and identification of hospital follow-up needs prior to discharge. Estimated LOS: 4-5 days Discharge Concerns: Need to establish a safety plan; Medication compliance and effectiveness;  Discharge Goals: Return home with outpatient referrals for mental health follow-up including medication management/psychotherapy;  Dispo: Home to family   I certify that inpatient services furnished can reasonably be expected to improve the patient's condition.   Danie Chandler, Medical Student 11/29/2020, 11:49 AM  Attestation for Student Documentation:  I certify that I saw and interviewed the patient together with the medical student and was present for the duration of the interview.  I reviewed the medical record.  I performed or reperformed the  mental status examination of the patient as indicated.  I formulated the assessment and plan of treatment as documented above. Viann Fish, MD, [facility] Ponder

## 2020-11-29 NOTE — [facility] Group Notes (Signed)
Adult Psychoeducational Group Note  Date:  11/29/2020 Time:  8:55 PM  Group Topic/Focus:  Wrap-Up Group:   The focus of this group is to help patients review their daily goal of treatment and discuss progress on daily workbooks.  Participation Level:  Active  Participation Quality:  Appropriate  Affect:  Appropriate  Cognitive:  Alert  Insight: Appropriate  Engagement in Group:  Engaged  Modes of Intervention:  Discussion  Additional Comments:  Patient attended and participated in the wrap-up group.  Janet Welch 11/29/2020, 8:55 PM

## 2020-11-29 NOTE — Progress Notes (Signed)
   On assessment, patient presents with anxiety and complaints of tiredness.  Patient is observed sitting in milieu with peers.  Patient denies SI/HI and verbally contracts for safety.  Patient denies AVH.  Patient is safe on unit with Q 15 minute safety checks.    11/28/20 2100  Psych Admission Type (Psych Patients Only)  Admission Status Involuntary  Psychosocial Assessment  Patient Complaints Anxiety;Other (Comment) ("Tired")  Eye Contact Fair  Facial Expression Flat  Affect Appropriate to circumstance  Speech Logical/coherent;Soft  Interaction Cautious;Minimal;Guarded  Motor Activity Other (Comment) (wnl)  Appearance/Hygiene Unremarkable  Behavior Characteristics Cooperative  Mood Pleasant;Anxious  Thought Process  Coherency WDL  Content WDL  Delusions None reported or observed  Perception WDL  Hallucination None reported or observed  Judgment Impaired  Confusion None  Danger to Self  Current suicidal ideation? Denies  Danger to Others  Danger to Others None reported or observed

## 2020-11-30 DIAGNOSIS — F312 Bipolar disorder, current episode manic severe with psychotic features: Secondary | ICD-10-CM | POA: Diagnosis not present

## 2020-11-30 DIAGNOSIS — S069XAA Unspecified intracranial injury with loss of consciousness status unknown, initial encounter: Secondary | ICD-10-CM

## 2020-11-30 MED ORDER — BENZTROPINE MESYLATE 0.5 MG PO TABS
0.5000 mg | ORAL_TABLET | Freq: Two times a day (BID) | ORAL | Status: DC
  Administered 2020-11-30 – 2020-12-03 (×6): 0.5 mg via ORAL
  Filled 2020-11-30 (×11): qty 1

## 2020-11-30 NOTE — [facility] Group Notes (Signed)
Patient attended music therapy group and was active in discussion of using music as a coping skill.  

## 2020-11-30 NOTE — Group Note (Signed)
LCSW Group Therapy Note   Group Date: 11/30/2020 Start Time: 1300 End Time: 1400   Type of Therapy and Topic:  Group Therapy: Coping Skills   Participation Level:  Active  Due to the acuity and complex discharge plans, group was not held. Patient was provided therapeutic worksheets and asked to meet with CSW as needed.  Darleen Crocker, LCSWA 11/30/2020  2:25 PM

## 2020-11-30 NOTE — [facility] Group Notes (Signed)
PsychoEducational Group- Self Care Toolkit and Sleep Hygiene were discussed in group. The patients were asked to identify healthy and unhealthy behaviors as it relates to sleep hygiene. Patients were also asked to identify simple was to improve self care, identifying a gratitude list, establishing healthy boundaries and recognizing the impact of unhealthy boundaries as it impacts mental health. Pt participated and was appropriate.  

## 2020-11-30 NOTE — Progress Notes (Signed)
     11/29/20 2109  Psych Admission Type (Psych Patients Only)  Admission Status Involuntary  Psychosocial Assessment  Patient Complaints Anxiety;Depression  Eye Contact Fair  Facial Expression Flat  Affect Appropriate to circumstance  Speech Logical/coherent  Interaction Forwards little  Motor Activity Slow  Appearance/Hygiene Unremarkable  Behavior Characteristics Cooperative  Mood Pleasant;Anxious  Thought Process  Coherency WDL  Content WDL  Delusions None reported or observed  Perception WDL  Hallucination None reported or observed  Judgment Poor  Confusion None  Danger to Self  Current suicidal ideation? Denies  Danger to Others  Danger to Others None reported or observed

## 2020-11-30 NOTE — Progress Notes (Signed)
[facility]  Progress Note  11/30/2020 3:10 PM Janet Welch  MRN:  [id number]  Subjective: Janet Welch reports, "I think I'm getting better, just a little groggy. I feel like my muscle are trying to draw up, like rigidity. It has happened to me in the past with some medicine that I took at the time. I learned that the medicine I'm on now that starts with a Z is also in the same group as that medicine that caused me to have muscle rigidity. And again I have been trying to do some exercise & work-out in my room to keep in shape. Now, the question is when am I going home?" Daily notes:  Janet Welch is seen, chart reviewed. The chart findings discussed with the treatment team. She presents alert, oriented & aware of situation. She is visible on the unit, attending group sessions. She presents today during this follow-up evaluation cheerful, verbally responsive & making a good eye contact. She says she has been in the hospital since the 7th of this month. She says she was brought to the hospital by her brother because her family were concerned that she was doing too much & not sleeping. She says she was told by her family that she has to get some sleep. As a result, Janet Welch says she will go to bed, lay there & think about sleep. She adds that when she is happy, that the family will think that her behavior is off. She feels she is doing well. She denies any symptoms of depression or anxiety. She denies any AVH, delusional thinking or paranoia. She does not appear to be responding to any internal stimuli. She says she thought she was doing well prior to admission even though her family thought otherwise. Her only complaint today is what she described the muscles of her arms drawing up. She says she has felt as such in the past when she was on a medication she called Risperdal. She says she understands that the medicine that she is on now is in the same family as the Risperdal. Other than this, she denies any other issues or concerns.  Patient did briefly narrate a story of TBI at age 71 from [place]. She believed her first psychiatric hospitalization was shortly after this TBI incident. Her prn Cogentin is changed to routine starting now for the complain of muscle rigidity. Vita signs revealed. Blood pressure is elevated: 134/95, will recheck ! Patient is in no apparent distress.  Per admission evaluation notes: Janet Welch is a 45 y.o. female with PMHx of bipolar disorder, 2 previous psych hospitalizations, head injury at 45yo, and current multiple gun access at home, who presented to [facility] (11/26/2020) as a walk-in for "increased anxiety and nervous energy", then admitted voluntarily for management of AVH, mania.  Principal Problem: Bipolar I disorder, current or most recent episode manic, severe with mood-congruent psychotic features ([place]) Diagnosis: Principal Problem:   Bipolar I disorder, current or most recent episode manic, severe with mood-congruent psychotic features ([place]) Active Problems:   TBI (traumatic brain injury)  Total Time Spent in Direct Patient Care:  I personally spent 30 minutes on the unit in direct patient care. The direct patient care time included face-to-face time with the patient, reviewing the patient's chart, communicating with other professionals, and coordinating care. Greater than 50% of this time was spent in counseling or coordinating care with the patient regarding goals of hospitalization, psycho-education, and discharge planning needs.  Past Psychiatric History:  See H&P  Past Medical History:  Past Medical History:  Diagnosis Date   No pertinent past medical history     Past Surgical History:  Procedure Laterality Date   CESAREAN SECTION N/A 07/07/2013   Procedure: CESAREAN SECTION;  Surgeon: Linda Hedges, DO;  Location: [facility];  Service: Obstetrics;  Laterality: N/A;   LEG SKIN LESION  BIOPSY / EXCISION     scar tissue biopsy     Family History:   Family History  Problem Relation Age of Onset   Thyroid disease Mother    Parkinson's disease Father    Family Psychiatric  History: See H&P  Social History:  Social History   Substance and Sexual Activity  Alcohol Use Yes   Alcohol/week: 0.0 - 1.0 standard drinks     Social History   Substance and Sexual Activity  Drug Use Yes   Types: Marijuana    Social History   Socioeconomic History   Marital status: Married    Spouse name: Not on file   Number of children: Not on file   Years of education: Not on file   Highest education level: Not on file  Occupational History   Not on file  Tobacco Use   Smoking status: Never   Smokeless tobacco: Never  Vaping Use   Vaping Use: Never used  Substance and Sexual Activity   Alcohol use: Yes    Alcohol/week: 0.0 - 1.0 standard drinks   Drug use: Yes    Types: Marijuana   Sexual activity: Yes  Other Topics Concern   Not on file  Social History Narrative   Not on file   Social Determinants of Health   Financial Resource Strain: Not on file  Food Insecurity: Not on file  Transportation Needs: Not on file  Physical Activity: Not on file  Stress: Not on file  Social Connections: Not on file   Sleep: Good, patient said she is feeling very groggy when she wakes up  Appetite:  Good  Current Medications: Current Facility-Administered Medications  Medication Dose Route Frequency Provider Last Rate Last Admin   acetaminophen (TYLENOL) tablet 650 mg  650 mg Oral Q6H PRN [place], Janet Welch       alum & mag hydroxide-simeth (MAALOX/MYLANTA) 200-200-20 MG/5ML suspension 30 mL  30 mL Oral Q4H PRN [facility], Janet Welch       benztropine (COGENTIN) tablet 0.5 mg  0.5 mg Oral BID Janet Welch I, Janet Welch   0.5 mg at 11/30/20 1504   hydrocortisone cream 1 %   Topical BID Harlow Asa, MD   Given at 11/30/20 2297   hydrocortisone cream 1 %   Topical BID PRN Merrily Brittle, DO   Given at 11/27/20 1542   hydrOXYzine (ATARAX/VISTARIL)  tablet 25 mg  25 mg Oral Q6H PRN Harlow Asa, MD   25 mg at 11/29/20 2109   OLANZapine zydis (ZYPREXA) disintegrating tablet 5 mg  5 mg Oral Q8H PRN Harlow Asa, MD       And   LORazepam (ATIVAN) tablet 1 mg  1 mg Oral PRN Harlow Asa, MD       And   ziprasidone (GEODON) injection 20 mg  20 mg Intramuscular PRN Nelda Marseille, Amy E, MD       magnesium hydroxide (MILK OF MAGNESIA) suspension 30 mL  30 mL Oral Daily PRN [facility], Janet Welch       OLANZapine zydis (ZYPREXA) disintegrating tablet 7.5 mg  7.5 mg Oral  Ambrose Pancoast, Amy E, MD   7.5 mg at 11/29/20 2110   traZODone (DESYREL) tablet 50 mg  50 mg Oral QHS PRN [facility], Janet Welch   50 mg at 11/28/20 2100    Lab Results: No results found for this or any previous visit (from the past 48 hour(s)).  Blood Alcohol level:  Lab Results  Component Value Date   ETH <10 11/22/2020   ETH <10 93/23/5573    Metabolic Disorder Labs: Lab Results  Component Value Date   HGBA1C 5.1 11/22/2020   MPG 99.67 11/22/2020   MPG 103 07/14/2018   Lab Results  Component Value Date   PROLACTIN 27.6 (H) 11/27/2020   PROLACTIN 31.8 (H) 04/02/2017   Lab Results  Component Value Date   CHOL 155 11/27/2020   TRIG 67 11/27/2020   HDL 63 11/27/2020   CHOLHDL 2.5 11/27/2020   VLDL 13 11/27/2020   LDLCALC 79 11/27/2020   LDLCALC 70 11/22/2020    Physical Findings: AIMS: Facial and Oral Movements Muscles of Facial Expression: None, normal Lips and Perioral Area: None, normal Jaw: None, normal Tongue: None, normal,Extremity Movements Upper (arms, wrists, hands, fingers): None, normal Lower (legs, knees, ankles, toes): None, normal, Trunk Movements Neck, shoulders, hips: None, normal, Overall Severity Severity of abnormal movements (highest score from questions above): None, normal Incapacitation due to abnormal movements: None, normal Patient's awareness of abnormal movements (rate only patient's report): No Awareness, Dental  Status Current problems with teeth and/or dentures?: No Does patient usually wear dentures?: No  CIWA:   N/A COWS:   N/A  Musculoskeletal: Strength & Muscle Tone: within normal limits Gait & Station: normal Patient leans: N/A  Psychiatric Specialty Exam:  Presentation  General Appearance: Appropriate for Environment, casual  Eye Contact:Good  Speech:Clear and Coherent  Speech Volume:Normal  Handedness:Left  Mood and Affect  Mood:described as improving but sleepy   Affect:calm, polite, but mildly sedated appearing  Thought Process  Thought Processes:Linear and less circumstantial and no longer tangential  Descriptions of Associations:Intact  Orientation:Full (Time, Place and Person)  Thought Content: Denies paranoia, ideas of reference, first rank symptoms, AVH or delusions - no evidence of response to internal/external stimuli on exam; no hyper-religious references today  History of Schizophrenia/Schizoaffective disorder:No Duration of Psychotic Symptoms:-- (unknown)  Hallucinations:Denied Ideas of Reference:Denied Suicidal Thoughts:Denied Homicidal Thoughts:Denied  Sensorium  Memory:Recent Good; Immediate Good; Remote Fair  Judgment:Fair  Insight:Fair  Executive Functions  Concentration:Fair  Attention Span:Fair  [place] of Knowledge:Good  Language:Good  Psychomotor Activity  Psychomotor Activity:No agitation or retardation; no cogwheeling, no stiffness, no tremor; AIMS 0  Assets  Assets:Communication Skills; Financial Resources/Insurance; Housing; Intimacy; Resilience; Social Support  Sleep  Sleep:5 hours  Physical Exam Vitals and nursing note reviewed.  Constitutional:      Appearance: Normal appearance. She is not ill-appearing.  HENT:     Head: Normocephalic and atraumatic.  Pulmonary:     Effort: Pulmonary effort is normal.  Skin:    General: Skin is warm and dry.  Neurological:     Mental Status: She is alert and  oriented to person, place, and time.   Review of Systems  Constitutional:  Positive for malaise/fatigue. Negative for chills.  Eyes:  Positive for blurred vision.  Respiratory:  Negative for cough and shortness of breath.   Cardiovascular:  Negative for chest pain.  Gastrointestinal:  Negative for constipation, nausea and vomiting.  Genitourinary:  Negative for dysuria.  Musculoskeletal:  Negative for myalgias.  Skin:  Positive for itching and rash.       Right forearm rash with localized itching.  Neurological:  Negative for dizziness and headaches.  Endo/Heme/Allergies:        Negative for nipple discharge.  All other systems reviewed and are negative. Blood pressure (!) 134/95, pulse 84, temperature 97.6 F (36.4 C), temperature source Oral, resp. rate 16, height 5\' 7"  (1.702 m), weight 86.2 kg, SpO2 100 %. Body mass index is 29.76 kg/m.   Treatment Plan Summary: Daily contact with patient to assess and evaluate symptoms and progress in treatment   ASSESSMENT: Principal Problem: Bipolar d/o manic with psychotic features   Bipolar d/o MRE manic with psychotic features Patient able to share her thoughts effectively in an organized fashion today. Zyprexa (5mg  BID) was started prior to inpatient admission, and this was changed to 10mg  qhs. Patient has been improving on Zyprexa, but given drowsiness and improvement in mania/psychosis, will decrease dosage. - Continue 7.5mg  qhs today to help reduce daytime sedation (QTC 463ms; Lipid panel WNL and A1c 5.1)  - Changed Cogentin 0.5 mg bid from PRN to routinely for EPS - Vistaril 25mg  q6 hours PRN anxiety - Trazodone 50mg  qhs PRN insomnia - Contacting family to see if patient is nearing clinical baseline   CBD use - episodic (R/o cannabis use d/o) - UDS positive for [facility] and patient admits to CBD use - counseled on need to abstain from use. Counseled that this could increase anxiety and psychosis as well as contribute to mania or  depressive episodes.   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 - Vistaril 25mg  q6 hours PRN itch - Hydrocortisone 1% cream BID   Elevated Prolactin 27.6 - Advised patient to monitor for galactorrhea and will trend lab - currently asymptomatic on Zyprexa - discussed that pituitary gland issue is in differential but most likely is related to use of atypical antipsychotic - will need f/u with PCP after discharge  Safety and Monitoring: Voluntary admission to inpatient psychiatric unit for safety, stabilization and treatment Daily contact with patient to assess and evaluate symptoms and progress in treatment Patient's case to be discussed in multi-disciplinary team meeting Observation Level : q15 minute checks Vital signs: q12 hours Precautions: suicide, elopement, and assault   Discharge Planning: Social work and case management to assist with discharge planning and identification of hospital follow-up needs prior to discharge. Estimated LOS: 4-5 days Discharge Concerns: Need to establish a safety plan; Medication compliance and effectiveness;  Discharge Goals: Return home with outpatient referrals for mental health follow-up including medication management/psychotherapy;  Disposition: Home to family   I certify that inpatient services furnished can reasonably be expected to improve the patient's condition.   Janet Spar, Janet Welch, Janet Welch, Janet Welch 11/30/2020, 3:10 PM  Patient ID: Janet Welch, female   DOB: [birth year], 45 y.o.   MRN: [id number]

## 2020-11-30 NOTE — Progress Notes (Signed)
Patient presents with flat, and anxious affect. Forwards minimal. Reports she feels as she is all over the place. Patient did not chose to elaborate. Reports she will speak with provider on tomorrow. Prn given for sleep along with scheduled medications. Patient noted in milieu, did attend group. No other concerns or complaints voiced.  Encouragement and support provided. Safety checks maintained. Medications given as prescribed.  Patient receptive and remains safe on unit with q 15 min checks.

## 2020-11-30 NOTE — [facility] Group Notes (Signed)
Goal Orientation group was held. Patient states she is feeling ''feeling good and has a lot of anxiety in the morning and needs to work on getting out of the room and working on anxiety. Also states laughter is the best medicine '' Unit and ward rules were covered. Patients were asked to complete self inventory and address all concerns with provider.

## 2020-11-30 NOTE — Plan of Care (Signed)
°  Problem: Education: °Goal: Emotional status will improve °Outcome: Progressing °Goal: Mental status will improve °Outcome: Progressing °  °Problem: Activity: °Goal: Interest or engagement in activities will improve °Outcome: Progressing °  °

## 2020-11-30 NOTE — B[facility] Group Notes (Signed)
Adult Psychoeducational Group Note  Date:  11/30/2020 Time:  9:19 PM  Group Topic/Focus:  Wrap-Up Group:   The focus of this group is to help patients review their daily goal of treatment and discuss progress on daily workbooks.  Participation Level:  None  Participation Quality:  Attentive  Affect:  Flat  Cognitive:  Appropriate  Insight: Improving  Engagement in Group:  None  Modes of Intervention:  Discussion, Education, and Support  Additional Comments:  when it was patient's turn during group she was not open to share. Once group was over while this writer was sitting in the dayroom patient apologized for not participating in group. Pt shared that she feels like she is at the acceptance stage of grief but continues to go backwards.Pt was encouraged to continue to work towards goals that she has sent.  [place], [place] 11/30/2020, 9:19 PM

## 2020-11-30 NOTE — Progress Notes (Signed)
D: Patient is attending group and participating. She appears more alert today. She denies any thoughts of self harm. She does not appear to be responding to internal stimuli.  A: Continue to monitor medication management and MD orders.  Safety checks completed every 15 minutes per protocol.  Offer support and encouragement as needed.  R: Patient is receptive to staff; his/her behavior is appropriate.     11/30/20 1200  Psych Admission Type (Psych Patients Only)  Admission Status Involuntary  Psychosocial Assessment  Patient Complaints Anxiety;Depression  Eye Contact Fair  Facial Expression Fixed smile  Affect Appropriate to circumstance  Speech Logical/coherent  Interaction Forwards little  Motor Activity Slow  Appearance/Hygiene Unremarkable  Behavior Characteristics Cooperative  Mood Pleasant  Thought Process  Coherency WDL  Content WDL  Delusions None reported or observed  Perception WDL  Hallucination None reported or observed  Judgment Poor  Confusion None  Danger to Self  Current suicidal ideation? Denies  Danger to Others  Danger to Others None reported or observed

## 2020-11-30 NOTE — Group Note (Signed)
Recreation Therapy Group Note   Group Topic:Stress Management  Group Date: 11/30/2020 Start Time: 0930 End Time: 0950 Facilitators: Victorino Sparrow, LRT,CTRS Location: [address] Dayroom  Goal Area(s) Addresses:  Patient will actively participate in stress management techniques presented during session.  Patient will successfully identify benefit of practicing stress management post d/c.   Group Description: Guided Imagery. LRT provided education, instruction, and demonstration on practice of visualization via guided imagery. Patient was asked to participate in the technique introduced during session. LRT debriefed including topics of mindfulness, stress management and specific scenarios each patient could use these techniques. Patients were given suggestions of ways to access scripts post d/c and encouraged to explore Youtube and other apps available on smartphones, tablets, and computers.   Affect/Mood: Appropriate   Participation Level: Minimal   Participation Quality: Independent   Behavior: Appropriate   Speech/Thought Process: Focused   Insight: Moderate   Judgement: Moderate   Modes of Intervention: Ambient Music, Script   Patient Response to Interventions:  Attentive   Education Outcome:  Acknowledges education and In group clarification offered    Clinical Observations/Individualized Feedback: Pt came for the last few minutes of group.  During that time, pt was attentive during activity.    Plan: Continue to engage patient in RT group sessions 2-3x/week.   Victorino Sparrow, LRT/CTRS 11/30/2020 11:05 AM

## 2020-11-30 NOTE — [facility] Group Notes (Signed)
Patient attended relaxation group. Patient was actively engaged and appropriate throughout.  

## 2020-12-01 DIAGNOSIS — F312 Bipolar disorder, current episode manic severe with psychotic features: Secondary | ICD-10-CM | POA: Diagnosis not present

## 2020-12-01 MED ORDER — OLANZAPINE 7.5 MG PO TABS
7.5000 mg | ORAL_TABLET | Freq: Every day | ORAL | Status: DC
  Administered 2020-12-01 – 2020-12-02 (×2): 7.5 mg via ORAL
  Filled 2020-12-01 (×4): qty 1
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
## 2020-12-01 NOTE — Progress Notes (Signed)
Pt presents with pleasant mood, affect congruent. She reports she is feeling much better, and states she is ready to discharge. She denies any SI/HI and denies any AV Hallucinations. Patient rates depression at 2/10 0 being the low scale. Patient did endorse some muscle stiffness and soreness, educated on cogentin. Patient has been observed attending group and appears in no acute distress.
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
## 2020-12-01 NOTE — Group Note (Signed)
Date:  12/01/2020 Time:  10:54 AM  Number of Participants: 10  Group Focus: daily focus Treatment Modality:  Psychoeducation Interventions utilized were group exercise Purpose: express feelings and increase insight   Participation Level: Pt attended psycho-ed group with RN.

## 2020-12-01 NOTE — [facility] Group Notes (Signed)
1600 - PsychoEducational group- Patients were educated on the power of negative versus positive thinking, and the habits of both and impact on our mental health. Patients were educated on positive re-framing and given examples in day to day life. Pts were given example by showing spaghetti noodles, while together, one thought is not much, over and over becomes more powerful, and harder to break. Patients were then asked to break a noodle and give an example of a negative thought in their life they needed to break free from. Pt participated and was appropriate. 

## 2020-12-01 NOTE — Progress Notes (Signed)
   12/01/20 2000  Psych Admission Type (Psych Patients Only)  Admission Status Involuntary  Psychosocial Assessment  Patient Complaints None  Eye Contact Fair  Facial Expression Anxious  Affect Appropriate to circumstance  Speech Logical/coherent  Interaction Assertive  Motor Activity Other (Comment) (wnl)  Appearance/Hygiene Unremarkable  Behavior Characteristics Cooperative;Calm  Mood Pleasant  Thought Process  Coherency WDL  Content WDL  Delusions None reported or observed  Perception WDL  Hallucination None reported or observed  Judgment Poor  Confusion None  Danger to Self  Current suicidal ideation? Denies  Danger to Others  Danger to Others None reported or observed   Pt seen talking with other peers. Pt denies SI, HI, AVH and pain. Pt denies anxiety and depression. Says she is getting better. Endorsed stiffness yesterday and was given Cogentin. Discussed the purpose of this medication. Pt denied any other issues. Attended groups today and appetite is good.

## 2020-12-01 NOTE — Group Note (Signed)
Date:  12/01/2020 Time:  9:26 AM  Group Topic/Focus:  Goals Group:   The focus of this group is to help patients establish daily goals to achieve during treatment and discuss how the patient can incorporate goal setting into their daily lives to aide in recovery. Orientation:   The focus of this group is to educate the patient on the purpose and policies of crisis stabilization and provide a format to answer questions about their admission.  The group details unit policies and expectations of patients while admitted.    Participation Level:  Active  Participation Quality:  Appropriate and Attentive  Affect:  Appropriate  Cognitive:  Alert and Appropriate  Insight: Appropriate and Good  Engagement in Group:  Engaged  Modes of Intervention:  Discussion  Additional Comments:  Pt has a goal today to rest, both this morning and afternoon.   Janet Welch 12/01/2020, 9:26 AM

## 2020-12-02 DIAGNOSIS — F312 Bipolar disorder, current episode manic severe with psychotic features: Secondary | ICD-10-CM | POA: Diagnosis not present

## 2020-12-02 NOTE — Progress Notes (Addendum)
[facility] MD Progress Note  12/02/2020 2:14 PM Janet Welch  MRN:  [id number] Subjective:   Janet Welch is a 45 y.o. female with a PMHx of BP1 d/o + psychotic features, psych hospitalization x2, head injury at 45yo, and current multiple gun access at home, who presented to [facility] (11/26/2020) voluntarily as a walk-in for "increased anxiety and nervous energy", then admitted for management of AVH, mania.  Overnight Events: Patient slept Number of Hours: 5.25. No acute events overnight. Patient has been compliant with scheduled meds, no agitation PRN's required, and attended group appropriately.  Interim History: Patient was evaluated this AM with attending Dr. Nelda Marseille.  She was initially seen walking around the unit, interacting with the patients.  Reported that she is feeling less drowsy today, and that her sleep and appetite are good.  Also stated that she is feeling better and not stiff after the Cogentin. Instructed patient to increase p.o. fluid intake, as Cogentin can be drying due to its anticholinergic effects.  Also discussed side effects of antipsychotics to watch for such as dystonia, restlessness, and tardive dyskinesia.  Stated that her sleep and appetite are good, and that she feels less anxious because of the better sleep.  She discussed her thoughts are her own, and described her internal monologue. Otherwise she denied SI/HI/AVH, racing thoughts, excessive energy, delusions, paranoia, first rank symptoms,or ideas of reference and contracted for safety on the unit.   Of note, was not able to get in contact with patient's biological brother.  Left a HIPAA compliant voicemail, will try again.  Principal Problem: Bipolar I disorder, current or most recent episode manic, severe with mood-congruent psychotic features ([place]) Diagnosis: Principal Problem:   Bipolar I disorder, current or most recent episode manic, severe with mood-congruent psychotic features  ([place]) Active Problems:   TBI (traumatic brain injury)  Total Time spent with patient:  I personally spent 30 minutes on the unit in direct patient care. The direct patient care time included face-to-face time with the patient, reviewing the patient's chart, communicating with other professionals, and coordinating care. Greater than 50% of this time was spent in counseling or coordinating care with the patient regarding goals of hospitalization, psycho-education, and discharge planning needs.  Past Psychiatric History:  Diagnosis: Bipolar 1 disorder Home meds: Zyprexa 5 mg twice daily Suicide attempts: Denied Homicidal behavior: Denied Nonsuicidal self-harm: Denied Hospitalizations: Previous x2 ([place] 2019 x4 da; ?); [facility] (11/23/2020); Current [place] (11/26/2020 for mania + AVH) Outpatient: None Past med trials: Fluvoxamine 100mg , atomoxetine 80mg , zyprexa 5mg , alprazolam 1mg , ambien 10mg , naltrexone 50mg  (per chart)  Past Medical History:  Past Medical History:  Diagnosis Date   No pertinent past medical history     Past Surgical History:  Procedure Laterality Date   CESAREAN SECTION N/A 07/07/2013   Procedure: CESAREAN SECTION;  Surgeon: Linda Hedges, DO;  Location: [facility];  Service: Obstetrics;  Laterality: N/A;   LEG SKIN LESION  BIOPSY / EXCISION     scar tissue biopsy     Family History:  Family History  Problem Relation Age of Onset   Thyroid disease Mother    Parkinson's disease Father    Family Psychiatric  History:  She says depression runs in her family. She said she has an aunt with schizophrenia. She is unaware of any familial suicides or suicide attempts.To her knowledge, denied history of bipolar disorder, suicide attempt in her family.  Social History:  Social History   Substance and  Sexual Activity  Alcohol Use Yes   Alcohol/week: 0.0 - 1.0 standard drinks     Social History   Substance and Sexual Activity  Drug Use Yes   Types: Marijuana    Social  History   Socioeconomic History   Marital status: Married    Spouse name: Not on file   Number of children: Not on file   Years of education: Not on file   Highest education level: Not on file  Occupational History   Not on file  Tobacco Use   Smoking status: Never   Smokeless tobacco: Never  Vaping Use   Vaping Use: Never used  Substance and Sexual Activity   Alcohol use: Yes    Alcohol/week: 0.0 - 1.0 standard drinks   Drug use: Yes    Types: Marijuana   Sexual activity: Yes  Other Topics Concern   Not on file  Social History Narrative   Not on file   Social Determinants of Health   Financial Resource Strain: Not on file  Food Insecurity: Not on file  Transportation Needs: Not on file  Physical Activity: Not on file  Stress: Not on file  Social Connections: Not on file   Additional Social History:  Patient lives with her husband, her children x4, and her parents, who live next door within the same building (similar to in-law suit). She has kids who are 7, 79, 44, and 13. 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 She says they have a lot of financial stress currently because they have tried to start a travel baseball team but have not received expected donations. She believes she and her husband need marital counseling and said she feels angry at her husband wishing he helped her more. She said her husband makes her paranoid and she believes they need to be separated. She has a Scientist, water quality in [employer]. They have guns in the home but they are kept in a safe.  She is independent in her ADLs, IADLs.  Sleep: Improved   Appetite:  Good  Current Medications: Current Facility-Administered Medications  Medication Dose Route Frequency Provider Last Rate Last Admin   acetaminophen (TYLENOL) tablet 650 mg  650 mg Oral Q6H PRN Derrill Center, NP       alum & mag hydroxide-simeth (MAALOX/MYLANTA) 200-200-20 MG/5ML suspension 30 mL  30  mL Oral Q4H PRN Derrill Center, NP       benztropine (COGENTIN) tablet 0.5 mg  0.5 mg Oral BID Lindell Spar I, NP   0.5 mg at 12/02/20 0851   hydrocortisone cream 1 %   Topical BID Harlow Asa, MD   Given at 12/01/20 1648   hydrocortisone cream 1 %   Topical BID PRN Merrily Brittle, DO   Given at 11/27/20 1542   hydrOXYzine (ATARAX/VISTARIL) tablet 25 mg  25 mg Oral Q6H PRN Harlow Asa, MD   25 mg at 11/29/20 2109   OLANZapine zydis (ZYPREXA) disintegrating tablet 5 mg  5 mg Oral Q8H PRN Harlow Asa, MD   5 mg at 11/30/20 2123   And   LORazepam (ATIVAN) tablet 1 mg  1 mg Oral PRN Harlow Asa, MD       And   ziprasidone (GEODON) injection 20 mg  20 mg Intramuscular PRN Nelda Marseille, Zorah Backes E, MD       magnesium hydroxide (MILK OF MAGNESIA) suspension 30 mL  30 mL Oral Daily PRN Ricky Ala  N, NP       OLANZapine (ZYPREXA) tablet 7.5 mg  7.5 mg Oral QHS Hill, Jackie Plum, MD   7.5 mg at 12/01/20 2111   traZODone (DESYREL) tablet 50 mg  50 mg Oral QHS PRN Derrill Center, NP   50 mg at 12/01/20 2111    Lab Results: No results found for this or any previous visit (from the past 48 hour(s)).  Blood Alcohol level:  Lab Results  Component Value Date   ETH <10 11/22/2020   ETH <10 81/19/1478    Metabolic Disorder Labs: Lab Results  Component Value Date   HGBA1C 5.1 11/22/2020   MPG 99.67 11/22/2020   MPG 103 07/14/2018   Lab Results  Component Value Date   PROLACTIN 27.6 (H) 11/27/2020   PROLACTIN 31.8 (H) 04/02/2017   Lab Results  Component Value Date   CHOL 155 11/27/2020   TRIG 67 11/27/2020   HDL 63 11/27/2020   CHOLHDL 2.5 11/27/2020   VLDL 13 11/27/2020   LDLCALC 79 11/27/2020   LDLCALC 70 11/22/2020    Physical Findings: AIMS: Facial and Oral Movements Muscles of Facial Expression: None, normal Lips and Perioral Area: None, normal Jaw: None, normal Tongue: None, normal,Extremity Movements Upper (arms, wrists, hands, fingers): None, normal Lower  (legs, knees, ankles, toes): None, normal, Trunk Movements Neck, shoulders, hips: None, normal, Overall Severity Severity of abnormal movements (highest score from questions above): None, normal Incapacitation due to abnormal movements: None, normal Patient's awareness of abnormal movements (rate only patient's report): No Awareness, Dental Status Current problems with teeth and/or dentures?: No Does patient usually wear dentures?: No      Musculoskeletal: Strength & Muscle Tone: within normal limits Gait & Station: normal, steady Patient leans: N/A  Psychiatric Specialty Exam:  Presentation  General Appearance: Appropriate for Environment; Casual; Fairly Groomed  Eye Contact:Good  Speech:Clear and Coherent; Normal Rate (Spontaneous)  Speech Volume:Normal  Handedness:Right   Mood and Affect  Mood:Euthymic, improving, less anxious  Affect: moderate, stable, less sedated appearing - calm   Thought Process  Thought Processes:Goal Directed; Linear; Coherent  Orientation:Full (Time, Place and Person)  Thought Content: Denies paranoia, ideas of reference, first rank symptoms, AVH or delusions - reports hearing own internal thoughts; no evidence of response to internal/external stimuli on exam; no hyper-religious references today  History of Schizophrenia/Schizoaffective disorder:No  Hallucinations:Hallucinations: None  Ideas of Reference:None  Suicidal Thoughts:Denied  Homicidal Thoughts:Denied  Sensorium  Memory:Immediate Good; Recent Good; Remote Fair  Judgment:Fair  Insight:Fair   Executive Functions  Concentration:Good  Attention Span:Good  [place] of Knowledge:Good  Language:Good   Psychomotor Activity  Psychomotor Activity: no cogwheeling, no stiffness, no tremor; AIMS 0  Assets  Assets:Communication Skills; Desire for Improvement; Financial Resources/Insurance; Housing; Social Support; Resilience; Physical Health; Leisure Time;  Intimacy   Sleep  Patient slept Number of Hours: 5.25   Physical Exam Vitals and nursing note reviewed.  Constitutional:      General: She is not in acute distress.    Appearance: She is not ill-appearing or diaphoretic.  HENT:     Head: Normocephalic.  Pulmonary:     Effort: Pulmonary effort is normal.  Skin:    General: Skin is warm and dry.  Neurological:     General: No focal deficit present.     Mental Status: She is alert.     Gait: Gait normal.   Review of Systems  Cardiovascular:  Negative for chest pain.  Gastrointestinal:  Negative for constipation,  diarrhea, nausea and vomiting.  Blood pressure 130/81, pulse 72, temperature 98 F (36.7 C), temperature source Oral, resp. rate 18, height 5\' 7"  (1.702 m), weight 86.2 kg, SpO2 100 %. Body mass index is 29.76 kg/m.   Treatment Plan Summary: Daily contact with patient to assess and evaluate symptoms and progress in treatment  ASSESSMENT: Principal Problem: Bipolar d/o MRE manic with psychotic features   Bipolar d/o MRE manic with psychotic features - Continue Zyprexa 7.5mg  qhs daytime sedation (QTC 471ms; Lipid panel WNL and A1c 5.1) AIMs 0 - Continue Cogentin 0.5 mg bid  for EPS   Anxiety:  - Vistaril 25mg  q6 hours PRN anxiety   Insomnia:  - Trazodone 50mg  qhs PRN insomnia   CBD use - episodic (R/o cannabis use d/o) - UDS positive for THC and patient admits to CBD use - counseled on need to abstain from use. Counseled that this could increase anxiety and psychosis as well as contribute to mania or depressive episodes.   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 - Vistaril 25mg  q6 hours PRN itch - Hydrocortisone 1% cream BID - change to PRN   Elevated Prolactin 27.6 - Advised patient to monitor for galactorrhea - currently asymptomatic on Zyprexa - discussed that pituitary gland issue is in differential but most likely is related to use of atypical antipsychotic - will need f/u with PCP after discharge    Safety and Monitoring: Voluntary admission to inpatient psychiatric unit for safety, stabilization and treatment Daily contact with patient to assess and evaluate symptoms and progress in treatment Patient's case to be discussed in multi-disciplinary team meeting Observation Level : q15 minute checks Vital signs: q12 hours Precautions: suicide, elopement, and assault   Discharge Planning: Social work and case management to assist with discharge planning and identification of hospital follow-up needs prior to discharge. Discharge Concerns: Need to establish a safety plan; Medication compliance and effectiveness;  Discharge Goals: Return home with outpatient referrals for mental health follow-up including medication management/psychotherapy;  Disposition: Home to family  Signed: Merrily Brittle, DO Psychiatry Resident, PGY-1 [facility] 12/02/2020, 2:14 PM

## 2020-12-02 NOTE — Progress Notes (Signed)
   12/02/20 2122  Psych Admission Type (Psych Patients Only)  Admission Status Involuntary  Psychosocial Assessment  Patient Complaints None  Eye Contact Fair  Facial Expression Anxious  Affect Appropriate to circumstance  Speech Logical/coherent  Interaction Assertive  Motor Activity Other (Comment) (WDL)  Appearance/Hygiene Unremarkable  Behavior Characteristics Cooperative;Appropriate to situation  Mood Pleasant  Thought Process  Coherency WDL  Content WDL  Delusions None reported or observed  Perception WDL  Hallucination None reported or observed  Judgment Poor  Confusion None  Danger to Self  Current suicidal ideation? Denies  Self-Injurious Behavior No self-injurious ideation or behavior indicators observed or expressed   Agreement Not to Harm Self Yes  Description of Agreement Verbal contract  Danger to Others  Danger to Others None reported or observed

## 2020-12-02 NOTE — Plan of Care (Signed)
Calm, pleasant and cooperative. Expressing readiness for discharge.

## 2020-12-02 NOTE — Progress Notes (Signed)
Adult Psychoeducational Group Note  Date:  12/02/2020 Time:  10:14 PM  Group Topic/Focus:  Wrap-Up Group:   The focus of this group is to help patients review their daily goal of treatment and discuss progress on daily workbooks.  Participation Level:  Active  Participation Quality:  Appropriate  Affect:  Appropriate  Cognitive:  Appropriate  Insight: Appropriate  Engagement in Group:  Engaged  Modes of Intervention:  Discussion  Additional Comments:  Pt stated her mind was clear, and she is feeling better since she came to the hospital.  Pt does not remember what her goal was, but pt stated she worked on her list that was given to her in group.  Tonia Brooms D 12/02/2020, 10:14 PM

## 2020-12-02 NOTE — [facility] Group Notes (Signed)
Psychoeducational Group Note  Date:  12/02/2020 Time:  1300-1400   Group Topic/Focus: This is a continuation of the group from Saturday. Pt's have been asked to formulate a list of 30 positives about themselves. This list is to be read 2 times a day for 30 days, looking in a mirror. Changing patterns of negative self talk. Also discussed is the fact that there have been some people who hurt [place] in the past. We keep that memory alive within [place]. Ways to cope with this are discused   Participation Level:  Active  Participation Quality:  Appropriate  Affect:  Appropriate  Cognitive:  Oriented  Insight: Improving  Engagement in Group:  Engaged  Modes of Intervention:  Activity, Discussion, Education, and Support  Additional Comments:  Pt participated in the group with only a few interactions. Approached this Probation officer after the group and wanted to clarify information.  [name]

## 2020-12-02 NOTE — [facility] Group Notes (Signed)
Pt didn't attend goals group 

## 2020-12-02 NOTE — [facility] Group Notes (Signed)
Adult Psychoeducational Group Not Date:  12/02/2020 Time:  0900-1045 Group Topic/Focus: PROGRESSIVE RELAXATION. A group where deep breathing is taught and tensing and relaxation muscle groups is used. Imagery is used as well.  Pts are asked to imagine 3 pillars that hold them up when they are not able to hold themselves up.  Participation Level:  Active  Participation Quality:  Appropriate  Affect:  Appropriate  Cognitive:  Oriented  Insight: Improving  Engagement in Group:  Engaged  Modes of Intervention:  Activity, Discussion, Education, and Support  Additional Comments:  Rates energy at a 7/10. Faith, family and friends hold her up  [name]

## 2020-12-02 NOTE — Group Note (Signed)
Between LCSW Group Therapy Note  12/02/2020   10:00-11:00AM  Type of Therapy and Topic:  Group Therapy:  Unhealthy versus Healthy Supports, Which Am I?  Participation Level:  Active   Description of Group:  Patients in this group were introduced to the concept that additional supports including self-support are an essential part of recovery.  Initially a discussion was held about the differences between healthy versus unhealthy supports.  Patients were asked to share what unhealthy supports in their lives need to be addressed, as well as what additional healthy supports could be added for greater help in reaching their goals.   A song entitled "My Own Hero" was played and a group discussion ensued in which patients stated they could relate to the song and it inspired them to realize they have be willing to help themselves in order to succeed, because other people cannot achieve sobriety or stability for them.  We discussed adding a variety of healthy supports to address the various needs in patient lives, including becoming more self-supportive.  A song was played called "I Know Where I've Been" toward the end of group and used to conduct an inspirational wrap-up to group of remembering how far they have already come in their journey.  Therapeutic Goals: 1)  Highlight the differences between healthy and unhealthy supports 2)  Suggest the importance of being a part of one's own support system 2)  Discuss reasons people in one's life may eventually be unable to be continually supportive  3)  Identify the patient's current support system   4) Elicit commitments to add healthy supports and to become more conscious of being self-supportive   Summary of Patient Progress:  The patient had focus issues today by her own admission.  However, she stayed in the room and made an effort to participate.    Therapeutic Modalities:   Motivational Interviewing Activity  Maretta Los , MSW, LCSW

## 2020-12-02 NOTE — BHH Suicide Risk Assessment (Signed)
[facility] Discharge Suicide Risk Assessment   Principal Problem: Bipolar affective disorder, current episode manic with psychotic symptoms [facility]) Discharge Diagnoses: Principal Problem:   Bipolar affective disorder, current episode manic with psychotic symptoms ([place])  Total Time Spent in Direct Patient Care:  I personally spent 35 minutes on the unit in direct patient care. The direct patient care time included face-to-face time with the patient, reviewing the patient's chart, communicating with other professionals, and coordinating care. Greater than 50% of this time was spent in counseling or coordinating care with the patient regarding goals of hospitalization, psycho-education, and discharge planning needs.  Subjective: Patient was seen on rounds with Doctor, hospital. She denies SI, HI, AVH, paranoia, ideas of reference, or first rank symptoms. She reports good sleep and appetite and voices no physical complaints. She denies feeling manic or grandiose and denies TD/EPS symptoms or muscle stiffness today. She was advised to watch for return of muscle stiffness or signs of TD and to continue cogentin. We discussed that the hope is that her Zyprexa dose can be reduced as an outpatient as she remains stable over time. She was reminded she will need ongoing monitoring of her metabolic labs, EKG, CBC, AIMS and weight while on Zyprexa. She was reminded to fluid hydrate and monitor for constipation on Cogentin. We again discussed her mildly elevated prolactin and the need to watch for galactorrhea and to have prolactin level rechecked by PCP as an outpatient. She denies current symptoms of galactorrhea and we discussed elevated prolactin is likely related to use of Zyprexa but if it remains elevated she may need pituitary evaluation with endocrinology. Time was given for questions. She can articulate and aftercare and safety plan. She was advised to discontinue THC and CBD products after  discharge.  Musculoskeletal: Strength & Muscle Tone: within normal limits Gait & Station: normal Patient leans: N/A Psychiatric Specialty Exam: Physical Exam Vitals and nursing note reviewed.  Constitutional:      Appearance: Normal appearance.  HENT:     Head: Normocephalic.  Pulmonary:     Effort: Pulmonary effort is normal.  Neurological:     General: No focal deficit present.     Mental Status: She is alert.    Review of Systems  Respiratory:  Negative for shortness of breath.   Cardiovascular:  Negative for chest pain.  Gastrointestinal:  Negative for diarrhea, nausea and vomiting.   Blood pressure 136/83, pulse (!) 105, temperature 97.8 F (36.6 C), temperature source Oral, resp. rate 18, height 5\' 7"  (1.702 m), weight 86.2 kg, SpO2 98 %.Body mass index is 29.76 kg/m.  General Appearance:  casually dressed, adequate hygiene  Eye Contact:  Good  Speech:  Clear and Coherent and Normal Rate  Volume:  Normal  Mood:  Euthymic  Affect:   moderate, stable, full  Thought Process:  Goal Directed and Linear  Orientation:  Full (Time, Place, and Person)  Thought Content:  Logical and denies AVH, paranoia, ideas of reference, or first rank symptoms  Suicidal Thoughts:  No  Homicidal Thoughts:  No  Memory:  Recent;   Good  Judgement:  Fair  Insight:  Fair  Psychomotor Activity:  Normal, no cogwheeling, no stiffness, no tremor  Concentration:  Concentration: Good and Attention Span: Good  Recall:  Good  Fund of Knowledge:  Good  Language:  Good  Akathisia:  Negative  AIMS (if indicated):   0  Assets:  Communication Skills Desire for Improvement Housing Physical Health Resilience Social Support Vocational/Educational  ADL's:  Intact  Cognition:  WNL  Sleep:  Number of Hours: 5.25    Mental Status Per Nursing Assessment::   On Admission:  mania and paranoia - resolved  Demographic Factors:  Married, caucasian  Loss Factors: Financial problems/change in  socioeconomic status and marriage stressors  Historical Factors: Prior psychiatric diagnosis/treatment; CBD use prior to admission  Risk Reduction Factors:   Responsible for children under 31 years of age, Sense of responsibility to family, Living with another person, especially a relative, Positive social support, and Positive coping skills or problem solving skills  Continued Clinical Symptoms:  Previous Psychiatric Diagnoses and Treatments Bipolar diagnosis  Cognitive Features That Contribute To Risk:  None    Suicide Risk:  Mild:  There are no identifiable plans, no associated intent, few other risk factors, and identifiable protective factors, including available and accessible social support.   Follow-up Information     [facility] Follow up on 12/11/2020.   Why: You are scheduled for an appointment on 12/11/2020 at 5:00pm.  Please bring a copy of your insurance card and ID.  This appointment is in person.  Paperwork will be sent to you ahead of time.  Please complete this and send back before your scheduled appointment. Contact information: Address: [address] [place], [place] [address]  Phone: [phone]        Bowen, Christ Kick, MD. Daphane Shepherd on 12/21/2020.   Specialty: Emergency Medicine Why: You have an appointment for medication management services on 12/21/20 at  .  This appointment will be held in person. Contact information: [address] [place] [address] [phone]         [facility]. Schedule an appointment as soon as possible for a visit.   Why: A referral has been made to this provider for medication management services.  The provider will call you, or you may call to schedule an appointment. Contact information: [address] [place], [place], [place] [address]  P: [phone] F: [phone]                Plan Of Care/Follow-up recommendations:  Activity:  as tolerated Diet:   heart healthy Other:  Patient advised to keep scheduled outpatient psychiatric medication management and therapy appointments without fail and to comply with medications. She was advised to watch for return of muscle stiffness, involuntary movements, tremors, or restlessness while on Zyprexa and to notify her provider immediately if any such symptoms develop. She was reminded that Cogentin is drying and may be constipating and to drink fluids and monitor bowel function. She was advised she will need ongoing monitoring of her glucose, weight, lipids, EKG, CBC and AIMS while on an atypical antipsychotic. She was advised that her prolactin level is mildly elevated which may be related to Zyprexa use. She is advised to monitor for signs of galactorrhea and to see her primary care provider for  monitoring of her prolactin level in the next 2 weeks and possible endocrine referral in the future as needed.She was advised to discontinue THC and CBD products after discharge.   Harlow Asa, MD, FAPA 12/03/2020, 7:49 AM

## 2020-12-03 ENCOUNTER — Encounter (HOSPITAL_COMMUNITY): Payer: Self-pay

## 2020-12-03 MED ORDER — OLANZAPINE 7.5 MG PO TABS: 7.5000 mg | ORAL_TABLET | Freq: Every day | ORAL | 0 refills | Status: DC

## 2020-12-03 MED ORDER — BENZTROPINE MESYLATE 0.5 MG PO TABS: 0.5000 mg | ORAL_TABLET | Freq: Two times a day (BID) | ORAL | 0 refills | Status: DC

## 2020-12-03 NOTE — Progress Notes (Signed)
  [facility] Adult Case Management Discharge Plan :  Will you be returning to the same living situation after discharge:  Yes,  Home  At discharge, do you have transportation home?: Yes,  Family  Do you have the ability to pay for your medications: Yes,  Insurance   Release of information consent forms completed and in the chart;  Patient's signature needed at discharge.  Patient to Follow up at:  Follow-up Information     [facility] Follow up on 12/11/2020.   Why: You are scheduled for an appointment on 12/11/2020 at 5:00pm.  Please bring a copy of your insurance card and ID.  This appointment is in person.  Paperwork will be sent to you ahead of time.  Please complete this and send back before your scheduled appointment. Contact information: Address: [place], [place] [address]  Phone: [phone]        Bowen, Christ Kick, MD. Daphane Shepherd on 12/21/2020.   Specialty: Emergency Medicine Why: You have an appointment for medication management services on 12/21/20 at  .  This appointment will be held in person. Contact information: [address] [place] [address] [phone]         [facility]. Schedule an appointment as soon as possible for a visit.   Why: A referral has been made to this provider for medication management services.  The provider will call you, or you may call to schedule an appointment. Contact information: [address] [place], [place], [place] [address]  P: [phone] F: [phone]                Next level of care provider has access to [place] and Suicide Prevention discussed: Yes,  with patient and brother      Has patient been referred to the Quitline?: Patient refused referral  Patient has been referred for addiction treatment: N/A  Darleen Crocker, [place] 12/03/2020, 11:16 AM

## 2020-12-03 NOTE — Group Note (Signed)
Recreation Therapy Group Note   Group Topic:Stress Management  Group Date: 12/03/2020 Start Time: 0930 End Time: 0945 Facilitators: Victorino Sparrow, [place] Location: [address] Day[address]   Goal Area(s) Addresses:  Patient will identify positive stress management techniques. Patient will identify benefits of using stress management post d/c.  Group Description:  Meditation.  LRT played a meditation that focused on self trust and how we sometimes doubt ourselves due to past mistakes and what it takes to regain that confidence/trust in ones self.  Patients were to listen, follow along and let their breathing relax each person as much as possible.   Affect/Mood: Appropriate   Participation Level: Engaged   Participation Quality: Independent   Behavior: Appropriate   Speech/Thought Process: Focused   Insight: Good   Judgement: Good   Modes of Intervention: Meditation   Patient Response to Interventions:  Engaged   Education Outcome:  Acknowledges education and In group clarification offered    Clinical Observations/Individualized Feedback: Pt attended and participated in group.    Plan: Continue to engage patient in RT group sessions 2-3x/week.   Victorino Sparrow, LRT,CTRS 12/03/2020 12:09 PM

## 2020-12-03 NOTE — Progress Notes (Signed)
D: Pt A & O X 4. Presents with fair eye contact, logical / soft speech and in a pleasant mood on interactions. Denies SI, HI, AVH and pain at this time. D/C home as ordered. Picked up in lobby by "my husband". A: D/C instructions reviewed with pt including prescriptions and follow up appointments; compliance encouraged. All belongings from locker 7 returned to pt at time of departure. Scheduled medications given with verbal education and effects monitored. Safety checks maintained without incident till time of d/c.  R: Pt receptive to care. Compliant with medications when offered. Denies adverse drug reactions when assessed. Verbalized understanding related to d/c instructions. Signed belonging sheet in agreement with items received from locker. Ambulatory with a steady gait. Appears to be in no physical distress at time of departure.

## 2020-12-03 NOTE — BHH Suicide Risk Assessment (Signed)
[place] INPATIENT:  Family/Significant Other Suicide Prevention Education  Suicide Prevention Education:  Education Completed; Janet Welch [phone] (Brother) has been identified by the patient as the family member/significant other with whom the patient will be residing, and identified as the person(s) who will aid the patient in the event of a mental health crisis (suicidal ideations/suicide attempt).  With written consent from the patient, the family member/significant other has been provided the following suicide prevention education, prior to the and/or following the discharge of the patient.  The suicide prevention education provided includes the following: Suicide risk factors Suicide prevention and interventions National Suicide Hotline telephone number [facility] assessment telephone number [facility] Emergency Assistance [place] and/or Residential Mobile Crisis Unit telephone number  Request made of family/significant other to: Remove weapons (e.g., guns, rifles, knives), all items previously/currently identified as safety concern.   Remove drugs/medications (over-the-counter, prescriptions, illicit drugs), all items previously/currently identified as a safety concern.  The family member/significant other verbalizes understanding of the suicide prevention education information provided.  The family member/significant other agrees to remove the items of safety concern listed above.  CSW spoke with Mr. Janet Welch who states that his sister lives with her husband and 4 children in the home.  He states that there parents also live in the home bu that they live in a detatched or separate part of the home.  He states that the husband and children are not in the home at this time but will be moving back in"but if she starts to decline again then they will leave again".  Mr. Janet Welch state that he is his sister's primary support.  He reports that he has removed all the  firearms from the home and has taken all the ammunition out of the home as well.  He reports that he will be helping to take his sister to appointments as well.  CSW completed SPE with Mr. Janet Welch.   Janet Welch 12/03/2020, 8:08 AM

## 2020-12-03 NOTE — BH IP Treatment Plan (Signed)
Interdisciplinary Treatment and Diagnostic Plan Update  12/03/2020 Time of Session: 10:35am GYNETH HUBKA MRN: [id number]  Principal Diagnosis: Bipolar I disorder, current or most recent episode manic, severe with mood-congruent psychotic features ([place])  Secondary Diagnoses: Principal Problem:   Bipolar I disorder, current or most recent episode manic, severe with mood-congruent psychotic features ([place])   Current Medications:  Current Facility-Administered Medications  Medication Dose Route Frequency Provider Last Rate Last Admin   acetaminophen (TYLENOL) tablet 650 mg  650 mg Oral Q6H PRN Derrill Center, NP       alum & mag hydroxide-simeth (MAALOX/MYLANTA) 200-200-20 MG/5ML suspension 30 mL  30 mL Oral Q4H PRN Derrill Center, NP       benztropine (COGENTIN) tablet 0.5 mg  0.5 mg Oral BID Lindell Spar I, NP   0.5 mg at 12/03/20 5397   hydrocortisone cream 1 %   Topical BID PRN Merrily Brittle, DO   Given at 11/27/20 1542   hydrOXYzine (ATARAX/VISTARIL) tablet 25 mg  25 mg Oral Q6H PRN Harlow Asa, MD   25 mg at 12/02/20 2122   OLANZapine zydis (ZYPREXA) disintegrating tablet 5 mg  5 mg Oral Q8H PRN Harlow Asa, MD   5 mg at 11/30/20 2123   And   LORazepam (ATIVAN) tablet 1 mg  1 mg Oral PRN Harlow Asa, MD       And   ziprasidone (GEODON) injection 20 mg  20 mg Intramuscular PRN Nelda Marseille, Amy E, MD       magnesium hydroxide (MILK OF MAGNESIA) suspension 30 mL  30 mL Oral Daily PRN Derrill Center, NP       OLANZapine (ZYPREXA) tablet 7.5 mg  7.5 mg Oral QHS Hill, Jackie Plum, MD   7.5 mg at 12/02/20 2122   traZODone (DESYREL) tablet 50 mg  50 mg Oral QHS PRN Derrill Center, NP   50 mg at 12/02/20 2123   PTA Medications: Medications Prior to Admission  Medication Sig Dispense Refill Last Dose   hydrOXYzine (ATARAX/VISTARIL) 25 MG tablet Take 1 tablet (25 mg total) by mouth 3 (three) times daily as needed for anxiety. 30 tablet 1    OLANZapine zydis (ZYPREXA) 5 MG  disintegrating tablet Take 1 tablet (5 mg total) by mouth at bedtime. 30 tablet 1    traZODone (DESYREL) 50 MG tablet Take 1 tablet (50 mg total) by mouth at bedtime as needed for sleep. 30 tablet 1     Patient Stressors: Financial difficulties   Health problems   Marital or family conflict    Patient Strengths: Ability for insight  Communication skills  Supportive family/friends   Treatment Modalities: Medication Management, Group therapy, Case management,  1 to 1 session with clinician, Psychoeducation, Recreational therapy.   Physician Treatment Plan for Primary Diagnosis: Bipolar I disorder, current or most recent episode manic, severe with mood-congruent psychotic features ([place]) Long Term Goal(s): Improvement in symptoms so as ready for discharge   Short Term Goals: Ability to identify changes in lifestyle to reduce recurrence of condition will improve Ability to verbalize feelings will improve Ability to disclose and discuss suicidal ideas Ability to identify and develop effective coping behaviors will improve Compliance with prescribed medications will improve Ability to identify triggers associated with substance abuse/mental health issues will improve  Medication Management: Evaluate patient's response, side effects, and tolerance of medication regimen.  Therapeutic Interventions: 1 to 1 sessions, Unit Group sessions and Medication administration.  Evaluation of Outcomes: Adequate for Discharge  Physician Treatment Plan for Secondary Diagnosis: Principal Problem:   Bipolar I disorder, current or most recent episode manic, severe with mood-congruent psychotic features ([place])  Long Term Goal(s): Improvement in symptoms so as ready for discharge   Short Term Goals: Ability to identify changes in lifestyle to reduce recurrence of condition will improve Ability to verbalize feelings will improve Ability to disclose and discuss suicidal ideas Ability to identify and develop  effective coping behaviors will improve Compliance with prescribed medications will improve Ability to identify triggers associated with substance abuse/mental health issues will improve     Medication Management: Evaluate patient's response, side effects, and tolerance of medication regimen.  Therapeutic Interventions: 1 to 1 sessions, Unit Group sessions and Medication administration.  Evaluation of Outcomes: Adequate for Discharge   RN Treatment Plan for Primary Diagnosis: Bipolar I disorder, current or most recent episode manic, severe with mood-congruent psychotic features ([place]) Long Term Goal(s): Knowledge of disease and therapeutic regimen to maintain health will improve  Short Term Goals: Ability to remain free from injury will improve, Ability to verbalize frustration and anger appropriately will improve, Ability to demonstrate self-control, Ability to identify and develop effective coping behaviors will improve, and Compliance with prescribed medications will improve  Medication Management: RN will administer medications as ordered by provider, will assess and evaluate patient's response and provide education to patient for prescribed medication. RN will report any adverse and/or side effects to prescribing provider.  Therapeutic Interventions: 1 on 1 counseling sessions, Psychoeducation, Medication administration, Evaluate responses to treatment, Monitor vital signs and CBGs as ordered, Perform/monitor CIWA, COWS, AIMS and Fall Risk screenings as ordered, Perform wound care treatments as ordered.  Evaluation of Outcomes: Adequate for Discharge   LCSW Treatment Plan for Primary Diagnosis: Bipolar I disorder, current or most recent episode manic, severe with mood-congruent psychotic features ([place]) Long Term Goal(s): Safe transition to appropriate next level of care at discharge, Engage patient in therapeutic group addressing interpersonal concerns.  Short Term Goals: Engage patient  in aftercare planning with referrals and resources, Increase social support, Increase ability to appropriately verbalize feelings, Identify triggers associated with mental health/substance abuse issues, and Increase skills for wellness and recovery  Therapeutic Interventions: Assess for all discharge needs, 1 to 1 time with Social worker, Explore available resources and support systems, Assess for adequacy in community support network, Educate family and significant other(s) on suicide prevention, Complete Psychosocial Assessment, Interpersonal group therapy.  Evaluation of Outcomes: Adequate for Discharge   Progress in Treatment: Attending groups: Yes. Participating in groups: Yes. Taking medication as prescribed: Yes. Toleration medication: Yes. Family/Significant other contact made: Yes, individual(s) contacted:  Brother  Patient understands diagnosis: No. Discussing patient identified problems/goals with staff: Yes. Medical problems stabilized or resolved: Yes. Denies suicidal/homicidal ideation: Yes. Issues/concerns per patient self-inventory: No.     New problem(s) identified: No, Describe:  None    New Short Term/Long Term Goal(s): medication stabilization, elimination of SI thoughts, development of comprehensive mental wellness plan.    Patient Goals: "To be more social and to go home"    Discharge Plan or Barriers: Patient is to return home and follow up with medication management and therapy at discharge.    Reason for Continuation of Hospitalization: Depression Medication stabilization    Estimated Length of Stay: 1 day   Scribe for Treatment Team: Vassie Moselle, LCSW 12/03/2020 11:03 AM

## 2020-12-03 NOTE — Discharge Instructions (Signed)
Dear Ms. Whidden, I am so glad you are feeling better and can be discharged today (12/03/2020)! You were admitted because of symptoms of mania.   Please see the following instructions: Please see your primary care / family doctor within 1 WEEK of leaving the hospital for a [place]. Bipolar 1 Disorder. PCP.  It was a pleasure meeting you, Ms. Ishii.  I wish you and your family the best, and hope you stay happy and healthy!  Merrily Brittle, DO 12/03/2020

## 2020-12-03 NOTE — BHH Group Notes (Signed)
Pt attended goals group and contributed 
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
## 2020-12-05 ENCOUNTER — Encounter: Payer: Self-pay | Admitting: Nurse Practitioner

## 2020-12-05 NOTE — Telephone Encounter (Signed)
I am not accepting new patients at this time, therefore not able to take over her care.  Please give her a list of PCPs accepting new patients.  She also needs to establish with Psychiatry for prescriptions - we can place referral if she needs one but she should have been set up with one when she got discharged.

## 2020-12-05 NOTE — Telephone Encounter (Signed)
Are you taking over care as PCP?  If so, please advise on pt's requests/questions. Thanks!

## 2020-12-28 ENCOUNTER — Ambulatory Visit: Payer: 59 | Admitting: Nurse Practitioner

## 2021-01-04 ENCOUNTER — Encounter: Payer: Self-pay | Admitting: Nurse Practitioner

## 2021-01-04 ENCOUNTER — Other Ambulatory Visit: Payer: Self-pay

## 2021-01-04 ENCOUNTER — Ambulatory Visit (INDEPENDENT_AMBULATORY_CARE_PROVIDER_SITE_OTHER): Payer: 59 | Admitting: Nurse Practitioner

## 2021-01-04 VITALS — BP 128/88 | HR 82 | Temp 98.0°F | Resp 18 | Ht 67.0 in | Wt 187.0 lb

## 2021-01-04 DIAGNOSIS — R399 Unspecified symptoms and signs involving the genitourinary system: Secondary | ICD-10-CM

## 2021-01-04 LAB — URINALYSIS, ROUTINE W REFLEX MICROSCOPIC
Bilirubin Urine: NEGATIVE
Glucose, UA: NEGATIVE
Hyaline Cast: NONE SEEN /LPF
Nitrite: NEGATIVE
Protein, ur: NEGATIVE
Specific Gravity, Urine: 1.005 (ref 1.001–1.035)
pH: 6 (ref 5.0–8.0)

## 2021-01-04 LAB — MICROSCOPIC MESSAGE

## 2021-01-04 MED ORDER — CEPHALEXIN 500 MG PO CAPS: 500.0000 mg | ORAL_CAPSULE | Freq: Four times a day (QID) | ORAL | 0 refills | Status: DC

## 2021-01-04 NOTE — Progress Notes (Signed)
Subjective:    Patient ID: Janet Welch, female    DOB: 07/31/1975, 45 y.o.   MRN: [id number]  HPI: Janet Welch is a 45 y.o. female presenting for urinary tract infection symptoms.  Chief Complaint  Patient presents with   Urinary Tract Infection   URINARY SYMPTOMS Duration: 3 weeks ago Dysuria:  not recently Urinary frequency: yes Urgency: yes Small volume voids: no Symptom severity: mild Urinary incontinence: has at baseline, has not gotten worse Foul odor: yes Hematuria: no Abdominal pain: no Back pain: no Suprapubic pain/pressure: yes Flank pain: no Fever:  no Nausea: no Vomiting: no Relief with cranberry juice: no Relief with pyridium: yes Status: better Previous urinary tract infection: no Recurrent urinary tract infection: no Sexual activity: Sexually active with 1 female partner History of sexually transmitted disease: no Vaginal discharge: no Treatments attempted: Azo, urocalm   Allergies  Allergen Reactions   Bee Venom Dermatitis    Outpatient Encounter Medications as of 01/04/2021  Medication Sig   benztropine (COGENTIN) 0.5 MG tablet Take 1 tablet (0.5 mg total) by mouth 2 (two) times daily.   cephALEXin (KEFLEX) 500 MG capsule Take 1 capsule (500 mg total) by mouth 4 (four) times daily.   OLANZapine (ZYPREXA) 7.5 MG tablet Take 1 tablet (7.5 mg total) by mouth at bedtime.   traZODone (DESYREL) 50 MG tablet Take 1 tablet (50 mg total) by mouth at bedtime as needed for sleep.   No facility-administered encounter medications on file as of 01/04/2021.    Patient Active Problem List   Diagnosis Date Noted   Bipolar I disorder, current or most recent episode manic, severe with mood-congruent psychotic features ([employer]) 04/01/2017   MDD (major depressive disorder), severe ([place]) 03/30/2017   MDD (major depressive disorder), recurrent severe, without psychosis ([place]) 03/30/2017    Past Medical History:  Diagnosis Date   No pertinent past medical  history     Relevant past medical, surgical, family and social history reviewed and updated as indicated. Interim medical history since our last visit reviewed.  Review of Systems Per HPI unless specifically indicated above     Objective:    BP 128/88 (BP Location: Left Arm, Patient Position: Sitting, Cuff Size: Normal)    Pulse 82    Temp 98 F (36.7 C) (Temporal)    Resp 18    Ht 5\' 7"  (1.702 m)    Wt 187 lb (84.8 kg)    SpO2 97%    BMI 29.29 kg/m   Wt Readings from Last 3 Encounters:  01/04/21 187 lb (84.8 kg)  11/21/20 192 lb (87.1 kg)  08/12/19 189 lb (85.7 kg)    Physical Exam Vitals and nursing note reviewed.  Constitutional:      General: She is not in acute distress.    Appearance: Normal appearance. She is not toxic-appearing.  Abdominal:     General: Abdomen is flat. Bowel sounds are normal. There is no distension.     Palpations: Abdomen is soft.     Tenderness: There is no right CVA tenderness or left CVA tenderness.  Skin:    General: Skin is warm and dry.     Coloration: Skin is not jaundiced or pale.     Findings: No erythema.  Neurological:     Mental Status: She is alert and oriented to person, place, and time.     Motor: No weakness.     Gait: Gait normal.  Psychiatric:  Mood and Affect: Mood normal.        Behavior: Behavior normal.        Thought Content: Thought content normal.        Judgment: Judgment normal.      Assessment & Plan:  1. UTI symptoms Acute x weeks.  Has self treated with Azo and Uricalm with relief in most of her symptoms.  UA today shows 2+ blood, 2+ leukocyte esterase, 6-10 WBC, few bacteria.  Will send for culture and sensitivity and in meantime, treat with Keflex 500 mg four times daily for 5 days.  Discussed ED precautions.    - Urinalysis, Routine w reflex microscopic - Urine Culture - cephALEXin (KEFLEX) 500 MG capsule; Take 1 capsule (500 mg total) by mouth 4 (four) times daily.  Dispense: 20 capsule; Refill:  0  Follow up plan: Return if symptoms worsen or fail to improve.

## 2021-01-05 LAB — URINE CULTURE
MICRO NUMBER:: 12767766
SPECIMEN QUALITY:: ADEQUATE

## 2021-02-20 ENCOUNTER — Other Ambulatory Visit: Payer: Self-pay

## 2021-02-20 ENCOUNTER — Ambulatory Visit (INDEPENDENT_AMBULATORY_CARE_PROVIDER_SITE_OTHER): Payer: 59 | Admitting: Nurse Practitioner

## 2021-02-20 ENCOUNTER — Encounter: Payer: Self-pay | Admitting: Nurse Practitioner

## 2021-02-20 VITALS — BP 124/86 | HR 73 | Temp 98.1°F | Ht 64.8 in | Wt 173.0 lb

## 2021-02-20 DIAGNOSIS — F322 Major depressive disorder, single episode, severe without psychotic features: Secondary | ICD-10-CM | POA: Diagnosis not present

## 2021-02-20 DIAGNOSIS — Z7689 Persons encountering health services in other specified circumstances: Secondary | ICD-10-CM

## 2021-02-20 DIAGNOSIS — F312 Bipolar disorder, current episode manic severe with psychotic features: Secondary | ICD-10-CM | POA: Diagnosis not present

## 2021-02-20 DIAGNOSIS — G4700 Insomnia, unspecified: Secondary | ICD-10-CM

## 2021-02-20 DIAGNOSIS — Z2821 Immunization not carried out because of patient refusal: Secondary | ICD-10-CM | POA: Diagnosis not present

## 2021-02-20 MED ORDER — ESZOPICLONE 2 MG PO TABS: 2.0000 mg | ORAL_TABLET | Freq: Every day | ORAL | 1 refills | Status: DC

## 2021-02-20 MED ORDER — MAGNESIUM 200 MG PO TABS: 1.0000 | ORAL_TABLET | Freq: Every day | ORAL | 1 refills | Status: DC

## 2021-02-20 NOTE — Patient Instructions (Addendum)
Insomnia Insomnia is a sleep disorder that makes it difficult to fall asleep or stay asleep. Insomnia can cause fatigue, low energy, difficulty concentrating, mood swings, and poor performance at work or school. There are three different ways to classify insomnia: Difficulty falling asleep. Difficulty staying asleep. Waking up too early in the morning. Any type of insomnia can be long-term (chronic) or short-term (acute). Both are common. Short-term insomnia usually lasts for three months or less. Chronic insomnia occurs at least three times a week for longer than three months. What are the causes? Insomnia may be caused by another condition, situation, or substance, such as: Anxiety. Certain medicines. Gastroesophageal reflux disease (GERD) or other gastrointestinal conditions. Asthma or other breathing conditions. Restless legs syndrome, sleep apnea, or other sleep disorders. Chronic pain. Menopause. Stroke. Abuse of alcohol, tobacco, or illegal drugs. Mental health conditions, such as depression. Caffeine. Neurological disorders, such as Alzheimer's disease. An overactive thyroid (hyperthyroidism). Sometimes, the cause of insomnia may not be known. What increases the risk? Risk factors for insomnia include: Gender. Women are affected more often than men. Age. Insomnia is more common as you get older. Stress. Lack of exercise. Irregular work schedule or working night shifts. Traveling between different time zones. Certain medical and mental health conditions. What are the signs or symptoms? If you have insomnia, the main symptom is having trouble falling asleep or having trouble staying asleep. This may lead to other symptoms, such as: Feeling fatigued or having low energy. Feeling nervous about going to sleep. Not feeling rested in the morning. Having trouble concentrating. Feeling irritable, anxious, or depressed. How is this diagnosed? This condition may be diagnosed  based on: Your symptoms and medical history. Your health care provider may ask about: Your sleep habits. Any medical conditions you have. Your mental health. A physical exam. How is this treated? Treatment for insomnia depends on the cause. Treatment may focus on treating an underlying condition that is causing insomnia. Treatment may also include: Medicines to help you sleep. Counseling or therapy. Lifestyle adjustments to help you sleep better. Follow these instructions at home: Eating and drinking  Limit or avoid alcohol, caffeinated beverages, and cigarettes, especially close to bedtime. These can disrupt your sleep. Do not eat a large meal or eat spicy foods right before bedtime. This can lead to digestive discomfort that can make it hard for you to sleep. Sleep habits  Keep a sleep diary to help you and your health care provider figure out what could be causing your insomnia. Write down: When you sleep. When you wake up during the night. How well you sleep. How rested you feel the next day. Any side effects of medicines you are taking. What you eat and drink. Make your bedroom a dark, comfortable place where it is easy to fall asleep. Put up shades or blackout curtains to block light from outside. Use a white noise machine to block noise. Keep the temperature cool. Limit screen use before bedtime. This includes: Watching TV. Using your smartphone, tablet, or computer. Stick to a routine that includes going to bed and waking up at the same times every day and night. This can help you fall asleep faster. Consider making a quiet activity, such as reading, part of your nighttime routine. Try to avoid taking naps during the day so that you sleep better at night. Get out of bed if you are still awake after 15 minutes of trying to sleep. Keep the lights down, but try reading or doing a  quiet activity. When you feel sleepy, go back to bed. General instructions Take over-the-counter  and prescription medicines only as told by your health care provider. Exercise regularly, as told by your health care provider. Avoid exercise starting several hours before bedtime. Use relaxation techniques to manage stress. Ask your health care provider to suggest some techniques that may work well for you. These may include: Breathing exercises. Routines to release muscle tension. Visualizing peaceful scenes. Make sure that you drive carefully. Avoid driving if you feel very sleepy. Keep all follow-up visits as told by your health care provider. This is important. Contact a health care provider if: You are tired throughout the day. You have trouble in your daily routine due to sleepiness. You continue to have sleep problems, or your sleep problems get worse. Get help right away if: You have serious thoughts about hurting yourself or someone else. If you ever feel like you may hurt yourself or others, or have thoughts about taking your own life, get help right away. You can go to your nearest emergency department or call: Your local emergency services (911 in the [place] A suicide crisis helpline, such as the [facility] at 6300778562 or 988 in the [place]. This is open 24 hours a day. Summary Insomnia is a sleep disorder that makes it difficult to fall asleep or stay asleep. Insomnia can be long-term (chronic) or short-term (acute). Treatment for insomnia depends on the cause. Treatment may focus on treating an underlying condition that is causing insomnia. Keep a sleep diary to help you and your health care provider figure out what could be causing your insomnia. This information is not intended to replace advice given to you by your health care provider. Make sure you discuss any questions you have with your health care provider. Document Revised: 08/01/2020 Document Reviewed: 11/17/2019 Elsevier Patient Education  2022 [place].   Take magnesium 200mg   with evening meal and you can try some aswhaganda supplement

## 2021-02-20 NOTE — Progress Notes (Signed)
I,Tianna Badgett,acting as a Education administrator for [employer], FNP.,have documented all relevant documentation on the behalf of Minette Brine, FNP,as directed by  Minette Brine, FNP while in the presence of Minette Brine, [place].  This visit occurred during the SARS-CoV-2 public health emergency.  Safety protocols were in place, including screening questions prior to the visit, additional usage of staff PPE, and extensive cleaning of exam room while observing appropriate contact time as indicated for disinfecting solutions.  Subjective:     Patient ID: Janet Welch , female    DOB: 02-02-1975 , 46 y.o.   MRN: [id number]   Chief Complaint  Patient presents with   Establish Care    HPI  Patient is here to establish care. She had been seen at [facility] last seen in December 2022 for UTI which seems to have cleared up. She had been seeing Dr Buelah Manis before she left the practice. She is married and has 4 children 3 boys (59, 66, 7) and one girl (24). She works as a Theme park manager.  She is married.   PMH: Bipolar (end of October), schizophrenia. She had also been hospitalized in 2021 [place], [place]) admitted. She feels better when she is not taking her medication and not as confused. March 2019 did not know who her family was then 1.5 years later occurred again. In 2021 went to [place], [place].   She is taking zyprexa and trazodone. She is not sleeping. She is taking Trazadone but will go to bed at 830p. Every other night she is not sleeping, one day she is fine the next day she does not. She reports it is difficult for her to relax. She is being followed by [facility] (virtual visits) Peggyann Juba. She prefers a female. Her children are now in regular school was being home schooled. When she tries to take two trazadone she bocomes groggy and can not function. She has not tried magnesium or aswhaghanda. Her husband reports she will stay awake for days and then will have the  episodes. He reports her symptoms began in 2019 when they made an in law suite for her parents.   She has taken benzotropine (caused her to be worse), hydroxyzine (makes her tired), trihexyphenidyl.   She would like discuss her concerns with her mental health.   Insomnia Primary symptoms: fragmented sleep, sleep disturbance, no malaise/fatigue.   The current episode started more than one year. The onset quality is sudden. The symptoms are aggravated by anxiety. Typical bedtime:  8-10 P.M..  PMH includes: depression.     Past Medical History:  Diagnosis Date   Bipolar 1 disorder ([place])    No pertinent past medical history      Family History  Problem Relation Age of Onset   Thyroid disease Mother    Mental illness Mother    Parkinson's disease Father    Stroke Maternal Grandmother    Stroke Maternal Grandfather      Current Outpatient Medications:    eszopiclone (LUNESTA) 2 MG TABS tablet, Take 1 tablet (2 mg total) by mouth at bedtime. Take immediately before bedtime, Disp: 30 tablet, Rfl: 1   Magnesium 200 MG TABS, Take 1 tablet (200 mg total) by mouth daily. Take with evening meal, Disp: 30 tablet, Rfl: 1   OLANZapine (ZYPREXA) 7.5 MG tablet, Take 1 tablet (7.5 mg total) by mouth at bedtime., Disp: 30 tablet, Rfl: 0   traZODone (DESYREL) 50 MG tablet, Take 1 tablet (50 mg  total) by mouth at bedtime as needed for sleep., Disp: 30 tablet, Rfl: 1   Allergies  Allergen Reactions   Bee Venom Dermatitis     Review of Systems  Constitutional: Negative.  Negative for malaise/fatigue.  Respiratory: Negative.    Cardiovascular: Negative.   Gastrointestinal: Negative.   Neurological: Negative.   Psychiatric/Behavioral:  Positive for depression and sleep disturbance. The patient has insomnia.     Today's Vitals   02/20/21 1418  BP: 124/86  Pulse: 73  Temp: 98.1 F (36.7 C)  TempSrc: Oral  Weight: 173 lb (78.5 kg)  Height: 5' 4.8" (1.646 m)   Body mass index is 28.97 kg/m.    Objective:  Physical Exam Vitals reviewed.  Constitutional:      General: She is not in acute distress.    Appearance: Normal appearance.  Cardiovascular:     Rate and Rhythm: Normal rate and regular rhythm.     Pulses: Normal pulses.     Heart sounds: Normal heart sounds. No murmur heard. Pulmonary:     Effort: Pulmonary effort is normal. No respiratory distress.     Breath sounds: Normal breath sounds. No wheezing.  Neurological:     General: No focal deficit present.     Mental Status: She is alert and oriented to person, place, and time.     Cranial Nerves: No cranial nerve deficit.     Motor: No weakness.  Psychiatric:        Mood and Affect: Mood is depressed. Affect is flat.        Speech: Speech normal.        Behavior: Behavior normal.        Thought Content: Thought content normal.        Cognition and Memory: Memory is impaired (difficulty remembering times).        Judgment: Judgment normal.        Assessment And Plan:     1. Insomnia, unspecified type Comments: Trazodone was ineffective, will try lunesta. She is to try magnesium as well. Will f/u in 4 weeks.  - eszopiclone (LUNESTA) 2 MG TABS tablet; Take 1 tablet (2 mg total) by mouth at bedtime. Take immediately before bedtime  Dispense: 30 tablet; Refill: 1 - Magnesium 200 MG TABS; Take 1 tablet (200 mg total) by mouth daily. Take with evening meal  Dispense: 30 tablet; Refill: 1 - Ambulatory referral to Psychology - Ambulatory referral to Psychiatry  2. Bipolar I disorder, current or most recent episode manic, severe with mood-congruent psychotic features ([place]) Comments: She is taking Olazapine but does not feel is effective. I will refer to psychiatry and psychology. She has been hospitalized 2 times in the last year - Ambulatory referral to Psychology - Ambulatory referral to Psychiatry  3. MDD (major depressive disorder), severe ([place]) Comments: Depression screen score 17. Offered [facility] Sight and she  and her husband declined  - Ambulatory referral to Psychology - Ambulatory referral to Psychiatry  4. COVID-19 vaccination declined Patient declined influenza vaccination at this time. Patient is aware that influenza vaccine prevents illness in 70% of healthy people, and reduces hospitalizations to 30-70% in elderly. This vaccine is recommended annually. Pt is willing to accept risk associated with refusing vaccination.  5. Establishing care with new doctor, encounter for   Patient was given opportunity to ask questions. Patient verbalized understanding of the plan and was able to repeat key elements of the plan. All questions were answered to their satisfaction.  Minette Brine, FNP  Teola Bradley, FNP, have reviewed all documentation for this visit. The documentation on 02/20/21 for the exam, diagnosis, procedures, and orders are all accurate and complete.   IF YOU HAVE BEEN REFERRED TO A SPECIALIST, IT MAY TAKE 1-2 WEEKS TO SCHEDULE/PROCESS THE REFERRAL. IF YOU HAVE NOT HEARD FROM US/SPECIALIST IN TWO WEEKS, PLEASE GIVE [place] A CALL AT [phone] X [phone].   THE PATIENT IS ENCOURAGED TO PRACTICE SOCIAL DISTANCING DUE TO THE COVID-19 PANDEMIC.

## 2021-02-25 ENCOUNTER — Ambulatory Visit (HOSPITAL_COMMUNITY)
Admission: EM | Admit: 2021-02-25 | Discharge: 2021-02-25 | Disposition: A | Discharge status: Home / Self Care | Payer: 59 | Attending: Registered Nurse | Admitting: Registered Nurse

## 2021-02-25 ENCOUNTER — Encounter (HOSPITAL_COMMUNITY): Payer: Self-pay | Admitting: Registered Nurse

## 2021-02-25 DIAGNOSIS — F3160 Bipolar disorder, current episode mixed, unspecified: Secondary | ICD-10-CM | POA: Diagnosis present

## 2021-02-25 NOTE — Discharge Instructions (Addendum)
Stop CBD use Continue Zyprexa, Cogentin, and Trazodone if needed for sleep   Bowen, Christ Kick, MD. Call for an appointment Specialty: Emergency Medicine Why: Medication management Contact information: [address] Linna Hoff [facility] [address] [phone]   [place]:  Call for appointment Why: Therapy Contact information:  Address: [place], [place] [address]  Phone: [phone]

## 2021-02-25 NOTE — Progress Notes (Signed)
°   02/25/21 1733  [place] Triage Screening (Walk-ins at [facility] only)  What Is the Reason for Your Visit/Call Today? Patient presents with her brother for assessment.  Patient struggles to answer simple questions, appears to be experiencing processing delay/thought blocking, as she eventually turns to her brother for assistance with answering questions.  She gives permission for him to share concerns.  He states patient was admitted to [facility] in Nov 2022, due to manic symptoms.  She has been compliant with medications and experienced improved mood approximately 2 weeks following discharge from [facility]. From this point, he began to notice patient was more reserved, quiet with more of a depressed mood.  Patient has had to miss work due to current symptoms, however she is unable to recall how many days she missed.  She lives with her husband and 4 children.  Per brother, patient's husband called him stating patient hasn't been herself, is "rigid, stiff and unable to show emotion."  Her husband is hoping patient will be admitted to [facility] to have her evaluated in an inpatient setting before coming home.  He did not present today, as he and the children have been at home sick.  Patient states her husband isn't very supportive and her brother states he wishes he had been more involved in her care.  He shares their mother was diagnosed with Bipolar and he "had to be very involved in her care, making sure she took medications and went to appointements."  He reports their mother is stable and doing quite well now. Patient becomes somewhat guarded stating, "I think I've shared all of this before.  I'd like to just go home."  She denies SI, HI, AVH or SA hx, although answers were delayed and she appears uncertain with answers, again looking to her brother with each question.  How Long Has This Been Causing You Problems? 1 wk - 1 month  Have You Recently Had Any Thoughts About Hurting Yourself? No  Are You Planning to Commit Suicide/Harm  Yourself At This time? No  Have you Recently Had Thoughts About [place]? No  Are You Planning To Harm Someone At This Time? No  Are you currently experiencing any auditory, visual or other hallucinations? No  Have You Used Any Alcohol or Drugs in the Past 24 Hours? No  Do you have any current medical co-morbidities that require immediate attention? No  Clinician description of patient physical appearance/behavior: Patient is well groomed, dressed casually and appears guarded.  She struggles to engage in assessment due to current mental state.  What Do You Feel Would Help You the Most Today? Treatment for Depression or other mood problem  If access to [facility] was not available, would you have sought care in the Emergency Department? No  Determination of Need Urgent (48 hours)  Options For Referral Intensive Outpatient Therapy;Inpatient Hospitalization;BH Urgent Care

## 2021-02-25 NOTE — BH Assessment (Signed)
Comprehensive Clinical Assessment (CCA) Note  02/25/2021 Janet Welch [id number]  Disposition: Disposition is pending provider evaluation.   The patient demonstrates the following risk factors for suicide: Chronic risk factors for suicide include: psychiatric disorder of Bipolar I Disorder . Acute risk factors for suicide include: family or marital conflict, social withdrawal/isolation, and recent discharge from inpatient psychiatry. Protective factors for this patient include: positive social support, positive therapeutic relationship, and responsibility to others (children, family). Considering these factors, the overall suicide risk at this point appears to be moderate. Patient is appropriate for outpatient follow up once stabilized.   Patient is a 46 year old female with a history of Bipolar I Disorder who presents voluntarily to [facility] for assessment.  Patient presents with her brother for assessment.  She struggles to answer simple questions, appears to be experiencing processing delay/thought blocking, as she eventually turns to her brother for assistance with answering questions.  She gives permission for him to share concerns.  He states patient was admitted to [facility] in Nov 2022, due to manic symptoms.  She has been compliant with medications and experienced improved mood approximately 2 weeks following discharge from [facility]. From this point, he began to notice patient was more reserved, quiet with more of a depressed mood.  Patient has had to miss work due to current symptoms, however she is unable to recall how many days she missed.  She works full time as a Paramedic. She lives with her husband and 4 children.  Per brother, patient's husband called him stating patient hasn't been herself, is "rigid, stiff and unable to show emotion."  Her husband is hoping patient will be admitted to [facility] to have her evaluated in an inpatient setting before coming home.  He did  not present today, as he and the children have been at home sick.  Patient states her husband isn't very supportive and her brother states he wishes he had been more involved in her care.  He shares their mother was diagnosed with Bipolar and he "had to be very involved in her care, making sure she took medications and went to appointements."  He reports their mother is stable and doing quite well now. Patient becomes somewhat guarded stating, "I think I've shared all of this before.  I'd like to just go home."  She denies SI, HI, AVH or SA hx, although answers were delayed and she appears uncertain with answers, again looking to her brother with each question.  Chief Complaint: No chief complaint on file.  Visit Diagnosis: Bipolar I Disorder    Flowsheet Row ED from 02/25/2021 in [facility] Office Visit from 02/20/2021 in [facility] Office Visit from 06/06/2016 in [place]  Thoughts that you would be better off dead, or of hurting yourself in some way Not at all Not at all Not at all  PHQ-9 Total Score 13 17 0      [place] ED from 02/25/2021 in [facility] Admission (Discharged) from 11/26/2020 in [facility] ED from 11/25/2020 in [place] No Risk No Risk No Risk        CCA Screening, Triage and Referral (STR)  Patient Reported Information How did you hear about [place]? Family/Friend  What Is the Reason for Your Visit/Call Today? Patient presents with her brother for assessment.  Patient struggles to answer simple questions,  appears to be experiencing processing delay/thought blocking, as she eventually turns to her brother for assistance with answering questions.  She gives permission for him to share concerns.  He states patient was admitted to [facility] in Nov 2022, due to manic symptoms.  She has been compliant  with medications and experienced improved mood approximately 2 weeks following discharge from [facility].  How Long Has This Been Causing You Problems? 1 wk - 1 month  What Do You Feel Would Help You the Most Today? Treatment for Depression or other mood problem   Have You Recently Had Any Thoughts About Hurting Yourself? No  Are You Planning to Commit Suicide/Harm Yourself At This time? No   Have you Recently Had Thoughts About [place]? No  Are You Planning to Harm Someone at This Time? No  Explanation: No data recorded  Have You Used Any Alcohol or Drugs in the Past 24 Hours? No  How Long Ago Did You Use Drugs or Alcohol? No data recorded What Did You Use and How Much? No data recorded  Do You Currently Have a Therapist/Psychiatrist? Yes  Name of Therapist/Psychiatrist: Unknown, patient isn't able to give name of provider/clinic, but states she has virtual sessions   Have You Been Recently Discharged From Any Office Practice or Programs? No  Explanation of Discharge From Practice/Program: No data recorded    CCA Screening Triage Referral Assessment Type of Contact: Face-to-Face  Telemedicine Service Delivery:   Is this Initial or Reassessment? No data recorded Date Telepsych consult ordered in [facility]:  No data recorded Time Telepsych consult ordered in [facility]:  No data recorded Location of Assessment: [facility] Assessment Services  Provider Location: [facility] Assessment Services   Collateral Involvement: Brother, [name], was present and participated in the assessment with pt's verbal persmission.   Does Patient Have a Stage manager Guardian? No data recorded Name and Contact of Legal Guardian: No data recorded If Minor and Not Living with Parent(s), Who has Custody? No data recorded Is CPS involved or ever been involved? Never  Is APS involved or ever been involved? Never   Patient Determined To Be At Risk for Harm To Self or Others Based on Review of  Patient Reported Information or Presenting Complaint? -- (Patient is having difficulty answering basic questions. She answers safety questions with delays, so response are unreliable.)  Method: No data recorded Availability of Means: No data recorded Intent: No data recorded Notification Required: No data recorded Additional Information for Danger to Others Potential: No data recorded Additional Comments for Danger to Others Potential: No data recorded Are There Guns or Other Weapons in Your Home? No data recorded Types of Guns/Weapons: No data recorded Are These Weapons Safely Secured?                            No data recorded Who Could Verify You Are Able To Have These Secured: No data recorded Do You Have any Outstanding Charges, Pending Court Dates, Parole/Probation? No data recorded Contacted To Inform of Risk of Harm To Self or Others: Family/Significant Other:    Does Patient Present under Involuntary Commitment? No  IVC Papers Initial File Date: No data recorded  [place] of Residence: [place]   Patient Currently Receiving the Following Services: Medication Management   Determination of Need: Urgent (48 hours)   Options For Referral: Intensive Outpatient Therapy; Inpatient Hospitalization; [facility]     CCA Biopsychosocial Patient  Reported Schizophrenia/Schizoaffective Diagnosis in Past: No   Strengths: Has support   Mental Health Symptoms Depression:   Difficulty Concentrating; Hopelessness   Duration of Depressive symptoms:  Duration of Depressive Symptoms: Greater than two weeks   Mania:   Racing thoughts (reduced need for sleep)   Anxiety:    Worrying; Tension   Psychosis:   None (Disorganized thought process, thought blocking and issues processing information)   Duration of Psychotic symptoms:  Duration of Psychotic Symptoms: Less than six months   Trauma:   None   Obsessions:   None   Compulsions:   None   Inattention:    None   Hyperactivity/Impulsivity:   None   Oppositional/Defiant Behaviors:   N/A   Emotional Irregularity:   None   Other Mood/Personality Symptoms:   uta    Mental Status Exam Appearance and self-care  Stature:   Tall   Weight:   Average weight   Clothing:   Neat/clean; Age-appropriate   Grooming:   Normal   Cosmetic use:   Age appropriate   Posture/gait:   Normal; Rigid   Motor activity:   Not Remarkable   Sensorium  Attention:   Distractible   Concentration:   Focuses on irrelevancies; Scattered; Preoccupied   Orientation:   Person; Place   Recall/memory:   Defective in Short-term; Defective in Immediate; Defective in Recent; Defective in Remote   Affect and Mood  Affect:   Anxious; Constricted; Restricted   Mood:   Dysphoric; Depressed   Relating  Eye contact:   Fleeting   Facial expression:   Constricted   Attitude toward examiner:   Guarded; Resistant   Thought and Language  Speech flow:  Paucity; Blocked   Thought content:   Appropriate to Mood and Circumstances   Preoccupation:   None   Hallucinations:   -- (denies, UTA given her current mental state)   Organization:  No data recorded  [employer] of Knowledge:   Fair   Intelligence:   Average   Abstraction:   Concrete   Judgement:   Impaired   Reality Testing:   Variable   Insight:   Lacking   Decision Making:   Impulsive; Only simple; Vacilates   Social Functioning  Social Maturity:   Irresponsible Special educational needs teacher)   Social Judgement:   Impropriety (uta)   Stress  Stressors:   Family conflict; Work   Coping Ability:   Overwhelmed; Deficient supports   Skill Deficits:   Communication   Supports:   Family; Friends/Service system     Religion: Religion/Spirituality Are You A Religious Person?: No  Leisure/Recreation: Leisure / Recreation Do You Have Hobbies?: Yes Leisure and Hobbies: Anything being  outdoors  Exercise/Diet: Exercise/Diet Do You Exercise?:  (uta) Have You Gained or Lost A Significant Amount of Weight in the Past Six Months?: No (uta) Do You Follow a Special Diet?:  (uta) Do You Have Any Trouble Sleeping?: Yes Explanation of Sleeping Difficulties: UTA with current mental state   CCA Employment/Education Employment/Work Situation: Employment / Work Situation Employment Situation: Employed Patient's Job has Been Impacted by Current Illness: No Has Patient ever Been in Passenger transport manager?: No  Education: Education Is Patient Currently Attending School?: No Last Grade Completed: 18 Did You Nutritional therapist?: Yes What Type of College Degree Do you Have?: Master's degree in health Did You Have An Individualized Education Program (IIEP): No Did You Have Any Difficulty At School?: No Patient's Education Has Been Impacted by Current Illness: No  CCA Family/Childhood History Family and Relationship History: Family history Marital status: Married What types of issues is patient dealing with in the relationship?: Patient states her husband isn't very supportive or understanding. Does patient have children?: Yes How many children?: 4 How is patient's relationship with their children?: "We have both good and bad days"  Childhood History:  Childhood History By whom was/is the patient raised?: Mother, Father, Grandparents Did patient suffer any verbal/emotional/physical/sexual abuse as a child?: No (Pt reports that they are no sure at this time) Did patient suffer from severe childhood neglect?: No Has patient ever been sexually abused/assaulted/raped as an adolescent or adult?: No Was the patient ever a victim of a crime or a disaster?: No Witnessed domestic violence?: No Has patient been affected by domestic violence as an adult?: No  Child/Adolescent Assessment:     CCA Substance Use Alcohol/Drug Use: Alcohol / Drug Use Pain Medications: See MAR Prescriptions:  See MAR Over the Counter: See MAR History of alcohol / drug use?: No history of alcohol / drug abuse Longest period of sobriety (when/how long): NA      ASAM's:  Six Dimensions of Multidimensional Assessment  Dimension 1:  Acute Intoxication and/or Withdrawal Potential:      Dimension 2:  Biomedical Conditions and Complications:      Dimension 3:  Emotional, Behavioral, or Cognitive Conditions and Complications:     Dimension 4:  Readiness to Change:     Dimension 5:  Relapse, Continued use, or Continued Problem Potential:     Dimension 6:  Recovery/Living Environment:     ASAM Severity Score:    ASAM Recommended Level of Treatment:     Substance use Disorder (SUD)    Recommendations for Services/Supports/Treatments:    Discharge Disposition:    DSM5 Diagnoses: Patient Active Problem List   Diagnosis Date Noted   Bipolar I disorder, current or most recent episode manic, severe with mood-congruent psychotic features ([place]) 04/01/2017   MDD (major depressive disorder), severe ([place]) 03/30/2017   MDD (major depressive disorder), recurrent severe, without psychosis ([place]) 03/30/2017    Referrals to Alternative Service(s): [facility], [facility]

## 2021-02-25 NOTE — ED Provider Notes (Signed)
Behavioral Health Urgent Care Medical Screening Exam  Patient Name: Janet Welch MRN: [id number] Date of Evaluation: 02/25/21 Chief Complaint:   Diagnosis:  Final diagnoses:  None    History of Present illness: Janet Welch is a 46 y.o. femalepatient presented to [facility] as a walk in accompanied by her brother with complaints of worsening depression  Janet Welch, 46 y.o., female patient seen face to face by this provider, consulted with Dr. Ernie Hew; and chart reviewed on 02/25/21.  On evaluation Janet Welch reports unsure of why she was brought here by her brother.  Most information obtained from patient's brother.  Patient's brother reports that patient has a history of bipolar disorder and was treated November 2022 at [facility].  Reports patient did well for a while but noticed that she started being more quiet and isolating.  Reports patient has missed several days from work but unsure how many days she has missed.  Patient reports that she called out of work last week.  When asked if she was taking her psychotropic medications at home she would not answer and then stated yes she was compliant with her medications.  Upon discharge at [facility] behavioral health patient was prescribed Cogentin, Zyprexa, and trazodone.  Patient has only been taking the Zyprexa.  Patient also reporting using CBD regularly.  Doing chart review noted that patient has CBD usage at that time also which may be the result of changes in mood and thought blocking.  Patient encouraged to stop CBD use and take psychotropic medications as ordered.  Patient also asked if she would like to stay overnight just for medication management and stated she did not want to.  Patient denies suicidal/self-harm/homicidal ideations, psychosis, paranoia.  Patient's brother is at her side and states that he does not feel patient is a danger to herself but would like for her to get medication management.  Educated on  the use of CBD and adverse reaction.  Also discussed adverse reactions to Zyprexa and encourage patient to take Cogentin as prescribed.  Understanding voiced During evaluation Janet Welch is sitting upright in chair in no acute distress.  She is alert/oriented x 4; calm/cooperative; and mood depressed with flat affect.  She is speaking in a clear tone at decreased volume, and normal pace; with good eye contact.  Her thought process is coherent and relevant but, delayed.  Possible thought blocking.  Patient states that it is taking her little time to get her thoughts together but understands conversation and able to respond.  There is no indication that she is currently responding to internal/external stimuli or experiencing delusional thought content; and she has denied suicidal/self-harm/homicidal ideation, psychosis, and paranoia.  Patient has remained calm throughout assessment and answered questions appropriately.    At this time Janet Welch and her brother are educated and verbalizes understanding of mental health resources and other crisis services in the community. She is instructed to call 911 and present to the nearest emergency room should she experience any suicidal/homicidal ideation, auditory/visual/hallucinations, or detrimental worsening of her mental health condition.  She was a also advised by Probation officer that she could call the toll-free phone on insurance card to assist with identifying in network counselors and agencies or number on back of Medicaid card to speak with care coordinator    Psychiatric Specialty Exam  Presentation  General Appearance:Appropriate for Environment; Casual  Eye Contact:Good  Speech:Clear and Coherent; Normal Rate  Speech Volume:Decreased  Handedness:Right   Mood and Affect  Mood:Anxious; Depressed  Affect:Flat   Thought Process  Thought Processes:Coherent; Goal Directed; Linear  Descriptions of Associations:Intact  Orientation:Full (Time,  Place and Person)  Thought Content:Logical; Other (comment) (Guarded)  Diagnosis of Schizophrenia or Schizoaffective disorder in past: No   Hallucinations:None (Patient denies; then states sometimes but denies that she is currently haveing auditory/visual hallucinations)  Ideas of Reference:Paranoia  Suicidal Thoughts:No Without Intent  Homicidal Thoughts:No   Sensorium  Memory:Immediate Fair; Recent Poor  Judgment:Fair  Insight:Present   Executive Functions  Concentration:Fair  Attention Span:Fair  [place]  Language:Good   Psychomotor Activity  Psychomotor Activity:Normal Yes   Assets  Assets:Communication Skills; Desire for Improvement; Financial Resources/Insurance; Housing; Resilience; Social Support   Sleep  Sleep:Fair  Number of hours: 5.5   Nutritional Assessment (For OBS and FBC admissions only) Has the patient had a weight loss or gain of 10 pounds or more in the last 3 months?: No Has the patient had a decrease in food intake/or appetite?: No Does the patient have dental problems?: No Does the patient have eating habits or behaviors that may be indicators of an eating disorder including binging or inducing vomiting?: No Has the patient recently lost weight without trying?: 0 Has the patient been eating poorly because of a decreased appetite?: 0 Malnutrition Screening Tool Score: 0    Physical Exam: Physical Exam Vitals and nursing note reviewed. Exam conducted with a chaperone present.  Constitutional:      General: She is not in acute distress.    Appearance: Normal appearance. She is not ill-appearing.  Cardiovascular:     Rate and Rhythm: Normal rate.  Pulmonary:     Effort: Pulmonary effort is normal.  Musculoskeletal:        General: Normal range of motion.     Cervical back: Normal range of motion.  Skin:    General: Skin is warm and dry.  Neurological:     Mental Status: She is alert and oriented  to person, place, and time.  Psychiatric:        Attention and Perception: Attention and perception normal. She does not perceive auditory or visual hallucinations.        Mood and Affect: Mood is depressed. Affect is flat.        Speech: Speech is delayed.        Behavior: Behavior normal. Behavior is cooperative.        Thought Content: Thought content normal. Thought content is not paranoid or delusional. Thought content does not include homicidal or suicidal ideation.   Review of Systems  Constitutional: Negative.   HENT: Negative.    Eyes: Negative.   Respiratory: Negative.    Cardiovascular: Negative.   Gastrointestinal: Negative.   Genitourinary: Negative.   Musculoskeletal: Negative.   Skin: Negative.   Neurological: Negative.   Endo/Heme/Allergies: Negative.   Psychiatric/Behavioral:  Depression: Stable. Hallucinations: Denies at this time. Suicidal ideas: Denies. The patient has insomnia (Using CBD daily to help sleep).   Blood pressure (!) 145/90, pulse 84, temperature 98 F (36.7 C), temperature source Oral, resp. rate 18, SpO2 98 %. There is no height or weight on file to calculate BMI.  Musculoskeletal: Strength & Muscle Tone: within normal limits Gait & Station: normal Patient leans: N/A   [place] MSE Discharge Disposition for Follow up and Recommendations: Based on my evaluation the patient does not appear to have an emergency medical condition and can be discharged with resources  and follow up care in outpatient services for Medication Management and Individual Therapy   Follow-up Information     Call  [place].   Specialty: Professional Counselor Why: schedule an appointment for medication management and therapy Contact information: [employer] [facility] [place] [address] [place], [place].   Why: Keep scheduled appointment for medication management. Contact  information: [address] [place] [address] [phone]         Call  [place], [facility].   Why: In-office and online appointments available Contact information: [place] [address] [phone]         Schedule an appointment as soon as possible for a visit  with [facility].   Specialty: Behavioral Health Contact information: [place] [phone]                 Discharge Instructions      Stop CBD use Continue Zyprexa, Cogentin, and Trazodone if needed for sleep   Bowen, Christ Kick, MD. Call for an appointment Specialty: Emergency Medicine Why: Medication management Contact information: [address] Linna Hoff [facility] [address] [phone]   [place] and [place]:  Call for appointment Why: Therapy Contact information:  Address: [address], [place] [address]  Phone: [phone]       Earleen Newport, NP 02/25/2021, 7:28 PM

## 2021-02-25 NOTE — Progress Notes (Signed)
°   02/25/21 1733  [place] Triage Screening (Walk-ins at [facility] only)  What Is the Reason for Your Visit/Call Today? Patient presents with her brother for assessment.  Patient struggles to answer simple questions, appears to be experiencing processing delay/thought blocking, as she eventually turns to her brother for assistance with answering questions.  She gives permission for him to share concerns.  He states patient was admitted to [facility] in Nov 2022, due to manic symptoms.  She has been compliant with medications and experienced improved mood approximately 2 weeks following discharge from [facility]. From this point, he began to notice patient was more reserved, quiet wit more of a depressed mood.  Patient has had to miss work due to current syptoms, however she is unable to recall how many days she missed.  She lives with her husband and 4 children.  Per brother, patient's husband called him stating patient hasn't been herself, is "rigid, stiff and unable to show emotion."  Her husband is hoping patient will be admitted to [facility] to have her evaluated in an inpatient setting before coming home.  He did not present today, as he and the children have been at home sick.  Patient states her husband isn't very supportive and her brother states he wishes he had been more involved in her care.  He shares their mother was diagnosed with Bipolar and he "had to be very involved in her care, making sure she took medications and went to appointements."  He reports their mother is stable and doing quite well now. Patient becomes somewhat guarded stating, "I think I've shared all of this before.  I'd like to just go home."  She denies SI, HI, AVH or SA hx, although answers were delayed and she appears uncertain with answers, again looking to her brother with each question.  How Long Has This Been Causing You Problems? 1 wk - 1 month  Have You Recently Had Any Thoughts About Hurting Yourself? No  Are You Planning to Commit Suicide/Harm  Yourself At This time? No  Have you Recently Had Thoughts About [place]? No  Are You Planning To Harm Someone At This Time? No  Are you currently experiencing any auditory, visual or other hallucinations? No  Have You Used Any Alcohol or Drugs in the Past 24 Hours? No  Do you have any current medical co-morbidities that require immediate attention? No  Clinician description of patient physical appearance/behavior: Patient is well groomed, dressed casually and appears guarded.  She struggles to engage in assessment due to current mental state.  What Do You Feel Would Help You the Most Today? Treatment for Depression or other mood problem  If access to [facility] was not available, would you have sought care in the Emergency Department? No  Determination of Need Urgent (48 hours)  Options For Referral Intensive Outpatient Therapy;Inpatient Hospitalization;BH Urgent Care

## 2021-03-04 ENCOUNTER — Other Ambulatory Visit: Payer: Self-pay

## 2021-03-04 ENCOUNTER — Encounter (HOSPITAL_COMMUNITY): Payer: Self-pay | Admitting: Psychiatry

## 2021-03-04 ENCOUNTER — Inpatient Hospital Stay (HOSPITAL_COMMUNITY)
Admission: RE | Admit: 2021-03-04 | Discharge: 2021-03-19 | DRG: 885 | Disposition: A | Discharge status: Home / Self Care | Payer: 59 | Attending: Psychiatry | Admitting: Psychiatry

## 2021-03-04 DIAGNOSIS — K5900 Constipation, unspecified: Secondary | ICD-10-CM | POA: Diagnosis present

## 2021-03-04 DIAGNOSIS — E876 Hypokalemia: Secondary | ICD-10-CM | POA: Diagnosis present

## 2021-03-04 DIAGNOSIS — G252 Other specified forms of tremor: Secondary | ICD-10-CM | POA: Diagnosis present

## 2021-03-04 DIAGNOSIS — Z20822 Contact with and (suspected) exposure to covid-19: Secondary | ICD-10-CM | POA: Diagnosis present

## 2021-03-04 DIAGNOSIS — F312 Bipolar disorder, current episode manic severe with psychotic features: Principal | ICD-10-CM | POA: Diagnosis present

## 2021-03-04 DIAGNOSIS — N390 Urinary tract infection, site not specified: Secondary | ICD-10-CM | POA: Diagnosis present

## 2021-03-04 DIAGNOSIS — G4700 Insomnia, unspecified: Secondary | ICD-10-CM | POA: Diagnosis present

## 2021-03-04 DIAGNOSIS — Z823 Family history of stroke: Secondary | ICD-10-CM

## 2021-03-04 DIAGNOSIS — F419 Anxiety disorder, unspecified: Secondary | ICD-10-CM | POA: Diagnosis present

## 2021-03-04 DIAGNOSIS — Z820 Family history of epilepsy and other diseases of the nervous system: Secondary | ICD-10-CM

## 2021-03-04 DIAGNOSIS — Z818 Family history of other mental and behavioral disorders: Secondary | ICD-10-CM | POA: Diagnosis not present

## 2021-03-04 DIAGNOSIS — Z91030 Bee allergy status: Secondary | ICD-10-CM | POA: Diagnosis not present

## 2021-03-04 DIAGNOSIS — Z8349 Family history of other endocrine, nutritional and metabolic diseases: Secondary | ICD-10-CM

## 2021-03-04 DIAGNOSIS — F319 Bipolar disorder, unspecified: Secondary | ICD-10-CM | POA: Diagnosis present

## 2021-03-04 DIAGNOSIS — R41843 Psychomotor deficit: Secondary | ICD-10-CM | POA: Diagnosis present

## 2021-03-04 LAB — RESP PANEL BY RT-PCR (FLU A&B, COVID) ARPGX2
Influenza A by PCR: NEGATIVE
Influenza B by PCR: NEGATIVE
SARS Coronavirus 2 by RT PCR: NEGATIVE

## 2021-03-04 MED ORDER — HYDROXYZINE HCL 25 MG PO TABS
25.0000 mg | ORAL_TABLET | Freq: Three times a day (TID) | ORAL | Status: DC | PRN
  Administered 2021-03-04 – 2021-03-13 (×7): 25 mg via ORAL
  Filled 2021-03-04 (×7): qty 1

## 2021-03-04 MED ORDER — MAGNESIUM HYDROXIDE 400 MG/5ML PO SUSP: 30.0000 mL | Freq: Every day | ORAL | Status: DC | PRN

## 2021-03-04 MED ORDER — ACETAMINOPHEN 325 MG PO TABS: 650.0000 mg | ORAL_TABLET | Freq: Four times a day (QID) | ORAL | Status: DC | PRN

## 2021-03-04 MED ORDER — ALUM & MAG HYDROXIDE-SIMETH 200-200-20 MG/5ML PO SUSP: 30.0000 mL | ORAL | Status: DC | PRN

## 2021-03-04 MED ORDER — TRAZODONE HCL 50 MG PO TABS
50.0000 mg | ORAL_TABLET | Freq: Every evening | ORAL | Status: DC | PRN
  Administered 2021-03-04 – 2021-03-07 (×4): 50 mg via ORAL
  Filled 2021-03-04 (×4): qty 1

## 2021-03-04 NOTE — BH Assessment (Signed)
Comprehensive Clinical Assessment (CCA) Note  03/04/2021 [facility] [id number]  DISPOSITION: Janet Garret NP recommends a inpatient [facility] to assist with stabilization. Patient was accepted to [facility] [id number].  [facility] (Current) from OP Visit from 03/04/2021 in [facility] from 02/25/2021 in [facility]) from 11/26/2020 in [facility]  C-SSRS RISK CATEGORY No Risk No Risk No Risk      The patient demonstrates the following risk factors for suicide: Chronic risk factors for suicide include: N/A. Acute risk factors for suicide include: N/A. Protective factors for this patient include: coping skills. Considering these factors, the overall suicide risk at this point appears to be low. Patient is not appropriate for outpatient follow up.   Janet [facility] is a 46 y.o. female who [facility] as a voluntary walk-in, accompanied by her husband, for evaluation of hallucinations. Patient was seen, chart reviewed and case discussed with Dr Ernie Hew. Patient has had .2 previous [facility] admissions and several ED visits for Bipolar disorder. Patient is guarded, her affect is flat. Her thought process is blocked and she is responding to internal stimuli. She denies hearing voices and answered "I don't know" to visual hallucinations. Her speech is slow. She admits to being paranoid that something is going to happen to her children. Patient's husband is present during the evaluation and he stated that the patient was trying to leave him and take their daughter with her. He stated the children are afraid of her. They have 4 children ages 15,11,9 and 69. He stated she was diagnosed with Bipolar disorder in 2019. He stated that after she came home from [facility] in November she has not improved. She is seeing a psychiatrist with [facility] and her medication was switched on Friday from Zyprexa to  [place] 20 mg. She is also being prescribed Lunesta 2 mg by her PCP. Patient's husband stated she is not sleeping most nights. Patient stated she does not know what she ate yesterday and she stated she thinks she eats once a day.    Patient is calm but suspicious and guarded. She is responding to internal stimuli. She does not know if she is having visual hallucinations, she is denying auditory hallucinations. She is paranoid that something is going to happen to her children and was trying to leave the house with her daughter yesterday. She told her husband she was leaving him. Patient is oriented to place, date, self and situation. She denies suicidal and homicidal ideation. Patient's mother and maternal aunt both have a history of mental health problems, husband does not know what their diagnosis is.     Chief Complaint:  Chief Complaint  Patient presents with   bipolar 1   Visit Diagnosis: Bipolar I Disorder    CCA Screening, Triage and Referral (STR)  Patient Reported Information How did you hear about [place]? Self  What Is the Reason for Your Visit/Call Today? Pt presents with ongoing anxiety, AMS and hallucinations  How Long Has This Been Causing You Problems? > than 6 months  What Do You Feel Would Help You the Most Today? Treatment for Depression or other mood problem   Have You Recently Had Any Thoughts About Hurting Yourself? No  Are You Planning to Commit Suicide/Harm Yourself At This time? No   Have you Recently Had Thoughts About [place]? No  Are You Planning to Harm Someone at This Time? No  Explanation: No data recorded  Have You Used Any Alcohol or Drugs in the Past 24 Hours? No  How Long Ago Did You Use Drugs or Alcohol? No data recorded What Did You Use and How Much? No data recorded  Do You Currently Have a Therapist/Psychiatrist? Yes  Name of Therapist/Psychiatrist: [facility]   Have You Been Recently Discharged From Any Office Practice or  Programs? No  Explanation of Discharge From Practice/Program: No data recorded    CCA Screening Triage Referral Assessment Type of Contact: Face-to-Face  Telemedicine Service Delivery:   Is this Initial or Reassessment? No data recorded Date Telepsych consult ordered in CHL:  No data recorded Time Telepsych consult ordered in CHL:  No data recorded Location of Assessment: [facility]  Provider Location: [facility]   Collateral Involvement: Husband who is present   Does Patient Have a Court Appointed Legal Guardian? No data recorded Name and Contact of Legal Guardian: No data recorded If Minor and Not Living with Parent(s), Who has Custody? NA  Is CPS involved or ever been involved? Never  Is APS involved or ever been involved? Never   Patient Determined To Be At Risk for Harm To Self or Others Based on Review of Patient Reported Information or Presenting Complaint? No  Method: No data recorded Availability of Means: No data recorded Intent: No data recorded Notification Required: No data recorded Additional Information for Danger to Others Potential: No data recorded Additional Comments for Danger to Others Potential: No data recorded Are There Guns or Other Weapons in Your Home? No data recorded Types of Guns/Weapons: No data recorded Are These Weapons Safely Secured?                            No data recorded Who Could Verify You Are Able To Have These Secured: No data recorded Do You Have any Outstanding Charges, Pending Court Dates, Parole/Probation? No data recorded Contacted To Inform of Risk of Harm To Self or Others: Other: Comment (NA)    Does Patient Present under Involuntary Commitment? No  IVC Papers Initial File Date: No data recorded  [place] of Residence: [place]   Patient Currently Receiving the Following Services: Medication Management   Determination of Need: Urgent (48 hours)   Options For Referral:  Inpatient Hospitalization     CCA Biopsychosocial Patient Reported Schizophrenia/Schizoaffective Diagnosis in Past: No   Strengths: Pt is willing to participate in treatment   Mental Health Symptoms Depression:   Hopelessness; Change in energy/activity   Duration of Depressive symptoms:  Duration of Depressive Symptoms: Greater than two weeks   Mania:   Racing thoughts; Change in energy/activity (reduced need for sleep)   Anxiety:    Worrying; Tension   Psychosis:   Hallucinations (Disorganized thought process, thought blocking and issues processing information)   Duration of Psychotic symptoms:  Duration of Psychotic Symptoms: Less than six months   Trauma:   None   Obsessions:   None   Compulsions:   None   Inattention:   None   Hyperactivity/Impulsivity:   None   Oppositional/Defiant Behaviors:   N/A   Emotional Irregularity:   None   Other Mood/Personality Symptoms:   uta    Mental Status Exam Appearance and self-care  Stature:   Tall   Weight:   Average weight   Clothing:   Neat/clean; Age-appropriate   Grooming:   Normal   Cosmetic use:   Age appropriate  Posture/gait:   Normal; Rigid   Motor activity:   Not Remarkable   Sensorium  Attention:   Distractible   Concentration:   Focuses on irrelevancies; Scattered; Preoccupied   Orientation:   Person; Place   Recall/memory:   Defective in Immediate; Defective in Recent; Defective in Remote; Defective in Short-term   Affect and Mood  Affect:   Anxious; Constricted; Depressed   Mood:   Depressed   Relating  Eye contact:   Fleeting   Facial expression:   Constricted   Attitude toward examiner:   Guarded; Resistant   Thought and Language  Speech flow:  Paucity; Blocked   Thought content:   Appropriate to Mood and Circumstances   Preoccupation:   None   Hallucinations:   -- (denies, UTA given her current mental state)   Organization:  No data  recorded  [employer] of Knowledge:   Fair   Intelligence:   Average   Abstraction:   Concrete   Judgement:   Impaired   Reality Testing:   Variable   Insight:   Lacking   Decision Making:   Impulsive; Only simple; Vacilates   Social Functioning  Social Maturity:   Irresponsible Special educational needs teacher)   Social Judgement:   Impropriety (uta)   Stress  Stressors:   Family conflict; Work   Coping Ability:   Overwhelmed; Deficient supports   Skill Deficits:   Communication   Supports:   Family; Friends/Service system     Religion: Religion/Spirituality Are You A Religious Person?: No  Leisure/Recreation: Leisure / Recreation Do You Have Hobbies?: Yes Leisure and Hobbies: Anything being outdoors  Exercise/Diet: Exercise/Diet Do You Exercise?:  (uta) Have You Gained or Lost A Significant Amount of Weight in the Past Six Months?: No (uta) Do You Follow a Special Diet?:  (uta) Do You Have Any Trouble Sleeping?: Yes Explanation of Sleeping Difficulties: UTA with current mental state   CCA Employment/Education Employment/Work Situation: Employment / Work Situation Employment Situation: Employed Patient's Job has Been Impacted by Current Illness: No Has Patient ever Been in Passenger transport manager?: No  Education: Education Last Grade Completed: 38 Did You Nutritional therapist?: Yes What Type of College Degree Do you Have?: Master's degree in health Did You Have An Individualized Education Program (IIEP): No Did You Have Any Difficulty At School?: No   CCA Family/Childhood History Family and Relationship History: Family history Marital status: Married Number of Years Married: 32 What types of issues is patient dealing with in the relationship?: Ongoing communication issues Does patient have children?: Yes How many children?: 4 How is patient's relationship with their children?: "We have both good and bad days"  Childhood History:  Childhood History By whom  was/is the patient raised?: Both parents Did patient suffer any verbal/emotional/physical/sexual abuse as a child?: No (Pt reports that they are no sure at this time) Did patient suffer from severe childhood neglect?: No Has patient ever been sexually abused/assaulted/raped as an adolescent or adult?: No Was the patient ever a victim of a crime or a disaster?: No Witnessed domestic violence?: No Has patient been affected by domestic violence as an adult?: No  Child/Adolescent Assessment:     CCA Substance Use Alcohol/Drug Use: Alcohol / Drug Use Pain Medications: See MAR Prescriptions: See MAR Over the Counter: See MAR History of alcohol / drug use?: No history of alcohol / drug abuse Longest period of sobriety (when/how long): NA  ASAM's:  Six Dimensions of Multidimensional Assessment  Dimension 1:  Acute Intoxication and/or Withdrawal Potential:      Dimension 2:  Biomedical Conditions and Complications:      Dimension 3:  Emotional, Behavioral, or Cognitive Conditions and Complications:     Dimension 4:  Readiness to Change:     Dimension 5:  Relapse, Continued use, or Continued Problem Potential:     Dimension 6:  Recovery/Living Environment:     ASAM Severity Score:    ASAM Recommended Level of Treatment:     Substance use Disorder (SUD)    Recommendations for Services/Supports/Treatments:    Discharge Disposition:    DSM5 Diagnoses: Patient Active Problem List   Diagnosis Date Noted   Bipolar 1 disorder ([place]) 03/04/2021   Bipolar I disorder, most recent episode mixed ([place]) 02/25/2021   Bipolar I disorder, current or most recent episode manic, severe with mood-congruent psychotic features ([place]) 04/01/2017   MDD (major depressive disorder), severe ([place]) 03/30/2017   MDD (major depressive disorder), recurrent severe, 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

## 2021-03-04 NOTE — Progress Notes (Signed)
Psychoeducational Group Note  Date:  03/04/2021 Time:  2143  Group Topic/Focus:  Wrap-Up Group:   The focus of this group is to help patients review their daily goal of treatment and discuss progress on daily workbooks.  Participation Level: Did Not Attend  Participation Quality:  Not Applicable  Affect:  Not Applicable  Cognitive:  Not Applicable  Insight:  Not Applicable  Engagement in Group: Not Applicable  Additional Comments:  The patient did not attend group this evening.  Archie Balboa S 03/04/2021, 9:43 PM

## 2021-03-04 NOTE — H&P (Signed)
Behavioral Health Medical Screening Exam  Janet [facility] is a 46 y.o. female who presented to [facility] as a voluntary walk-in, accompanied by her husband, for evaluation of hallucinations. Patient was seen, chart reviewed and case discussed with Dr Ernie Hew. Patient has had .2 previous [facility] admissions and several ED visits for Bipolar disorder. Patient is guarded, her affect is flat. Her thought process is blocked and she is responding to internal stimuli. She denies hearing voices and answered "I don't know" to visual hallucinations. Her speech is slow. She admits to being paranoid that something is going to happen to her children. Patient's husband is present during the evaluation and he stated that the patient was trying to leave him and take their daughter with her. He stated the children are afraid of her. They have 4 children ages 6,11,9 and 79. He stated she was diagnosed with Bipolar disorder in 2019. He stated that after she came home from [facility] in November she has not improved. She is seeing a psychiatrist with [facility] and her medication was switched on Friday from Zyprexa to [place] 20 mg. She is also being prescribed Lunesta 2 mg by her PCP. Patient's husband stated she is not sleeping most nights. Patient stated she does not know what she ate yesterday and she stated she thinks she eats once a day.   Patient is calm but suspicious and guarded. She is responding to internal stimuli. She does not know if she is having visual hallucinations, she is denying auditory hallucinations. She is paranoid that something is going to happen to her children and was trying to leave the house with her daughter yesterday. She told her husband she was leaving him. Patient is oriented to place, date, self and situation. She denies suicidal and homicidal ideation. Patient's mother and maternal aunt both have a history of mental health problems, husband does not know what their diagnosis is.   Patient would benefit  from an inpatient psychiatric hospitalization for crisis stabilization and medication management.   Total Time spent with patient: 30 minutes  Psychiatric Specialty Exam:  Presentation  General Appearance: Appropriate for Environment; Casual  Eye Contact:Fleeting  Speech:Blocked  Speech Volume:Decreased  Handedness:Right  Mood and Affect  Mood:Depressed  Affect:Constricted; Depressed  Thought Process  Thought Processes:Disorganized (guarded)  Descriptions of Associations:Intact  Orientation:Full (Time, Place and Person)  Thought Content:Paranoid Ideation; Delusions (guarded with thought blocking)  History of Schizophrenia/Schizoaffective disorder:No  Duration of Psychotic Symptoms:N/A  Hallucinations:Hallucinations: Auditory; Visual Description of Auditory Hallucinations: patient denies hearing voices, she is responding to internal stimuli Description of Visual Hallucinations: patient answered "I don't know" to the question of visual hallucinations  Ideas of Reference:Paranoia; Delusions  Suicidal Thoughts:Suicidal Thoughts: No  Homicidal Thoughts:Homicidal Thoughts: No  Sensorium  Memory:Immediate Poor; Recent Poor; Remote Poor  Judgment:Poor  Insight:Poor  Executive Functions  Concentration:Fair  Attention Span:Fair  [place]  Language:Good   Psychomotor Activity  Psychomotor Activity:Psychomotor Activity: Decreased  Assets  Assets:Communication Skills; Financial Resources/Insurance; Housing; Social Support  Sleep  Sleep:Sleep: Poor  Physical Exam: Physical Exam HENT:     Head: Normocephalic and atraumatic.     Nose: Nose normal.  Eyes:     Pupils: Pupils are equal, round, and reactive to light.  Pulmonary:     Effort: Pulmonary effort is normal.  Musculoskeletal:        General: Normal range of motion.     Cervical back: Normal range of motion.  Neurological:  General: No focal deficit present.      Mental Status: She is alert and oriented to person, place, and time.  Psychiatric:        Attention and Perception: She perceives auditory and visual hallucinations.        Mood and Affect: Mood is depressed. Affect is flat.        Speech: Speech normal.        Behavior: Behavior is withdrawn.        Thought Content: Thought content is paranoid and delusional. Thought content does not include homicidal or suicidal ideation. Thought content does not include homicidal or suicidal plan.   Review of Systems  Constitutional: Negative.  Negative for fever.  HENT: Negative.  Negative for congestion and sore throat.   Respiratory: Negative.  Negative for cough and shortness of breath.   Cardiovascular: Negative.  Negative for chest pain.  Musculoskeletal: Negative.   Neurological: Negative.   Psychiatric/Behavioral:  Positive for depression and hallucinations (auditory and visual).    Blood pressure 134/72, pulse 97, temperature 98.4 F (36.9 C), temperature source Oral, SpO2 100 %. There is no height or weight on file to calculate BMI.  Musculoskeletal: Strength & Muscle Tone: within normal limits Gait & Station: normal Patient leans: N/A  Recommendations:  Based on my evaluation the patient does not appear to have an emergency medical condition. Patient is recommended and meets criteria for inpatient hospitalization.   Ethelene Hal, NP 03/04/2021, 2:33 PM

## 2021-03-04 NOTE — Progress Notes (Signed)
Janet [facility] is a 46 y.o. female who presented to [facility] as a voluntary walk-in, accompanied by her husband, for evaluation of hallucinations.    During assessment at [facility], pt was thought blocking, limited speech and took a long time to respond to basic questions.  Pt was paranoid and suspicious.  Pt denied SI/HI/AVH although pt appeared to be responding to internal stimuli.  Pt signed admission forms and is adjusting well to being on the unit.

## 2021-03-04 NOTE — Tx Team (Signed)
Initial Treatment Plan 03/04/2021 8:34 PM [facility] [id number]    PATIENT STRESSORS: Marital or family conflict     PATIENT STRENGTHS: Physical Health    PATIENT IDENTIFIED PROBLEMS: Psychosis  Medications need to be adjusted                   DISCHARGE CRITERIA:  Improved stabilization in mood, thinking, and/or behavior  PRELIMINARY DISCHARGE PLAN: Outpatient therapy  PATIENT/FAMILY INVOLVEMENT: This treatment plan has been presented to and reviewed with the patient, Janet [facility],  The patient has been given the opportunity to ask questions and make suggestions.  Luna Glasgow, RN 03/04/2021, 8:34 PM

## 2021-03-05 DIAGNOSIS — F312 Bipolar disorder, current episode manic severe with psychotic features: Principal | ICD-10-CM

## 2021-03-05 LAB — COMPREHENSIVE METABOLIC PANEL
ALT: 15 U/L (ref 0–44)
AST: 15 U/L (ref 15–41)
Albumin: 4.1 g/dL (ref 3.5–5.0)
Alkaline Phosphatase: 48 U/L (ref 38–126)
Anion gap: 10 (ref 5–15)
BUN: 10 mg/dL (ref 6–20)
CO2: 24 mmol/L (ref 22–32)
Calcium: 8.9 mg/dL (ref 8.9–10.3)
Chloride: 103 mmol/L (ref 98–111)
Creatinine, Ser: 0.71 mg/dL (ref 0.44–1.00)
GFR, Estimated: >60 mL/min (ref >60)
Glucose, Bld: 113 mg/dL — ABNORMAL HIGH (ref 70–99)
Potassium: 3 mmol/L — ABNORMAL LOW (ref 3.5–5.1)
Sodium: 137 mmol/L (ref 135–145)
Total Bilirubin: 0.7 mg/dL (ref 0.3–1.2)
Total Protein: 7 g/dL (ref 6.5–8.1)

## 2021-03-05 LAB — CBC
HCT: 43 % (ref 36.0–46.0)
Hemoglobin: 14.1 g/dL (ref 12.0–15.0)
MCH: 29.3 pg (ref 26.0–34.0)
MCHC: 32.8 g/dL (ref 30.0–36.0)
MCV: 89.4 fL (ref 80.0–100.0)
Platelets: 283 10*3/uL (ref 150–400)
RBC: 4.81 MIL/uL (ref 3.87–5.11)
RDW: 12.7 % (ref 11.5–15.5)
WBC: 6.2 10*3/uL (ref 4.0–10.5)
nRBC: 0 % (ref 0.0–0.2)

## 2021-03-05 LAB — URINALYSIS, COMPLETE (UACMP) WITH MICROSCOPIC
Bilirubin Urine: NEGATIVE
Glucose, UA: NEGATIVE mg/dL
Ketones, ur: 5 mg/dL — AB
Nitrite: NEGATIVE
Protein, ur: NEGATIVE mg/dL
Specific Gravity, Urine: 1.011 (ref 1.005–1.030)
pH: 6 (ref 5.0–8.0)

## 2021-03-05 LAB — LIPID PANEL
Cholesterol: 160 mg/dL (ref 0–200)
HDL: 61 mg/dL (ref >40)
LDL Cholesterol: 88 mg/dL (ref 0–99)
Total CHOL/HDL Ratio: 2.6 RATIO
Triglycerides: 54 mg/dL (ref <150)
VLDL: 11 mg/dL (ref 0–40)

## 2021-03-05 LAB — PREGNANCY, URINE: Preg Test, Ur: NEGATIVE

## 2021-03-05 LAB — TSH: TSH: 1.407 u[IU]/mL (ref 0.350–4.500)

## 2021-03-05 LAB — HEMOGLOBIN A1C
Hgb A1c MFr Bld: 4.9 % (ref 4.8–5.6)
Mean Plasma Glucose: 93.93 mg/dL

## 2021-03-05 LAB — ETHANOL: Alcohol, Ethyl (B): <10 mg/dL (ref <10)

## 2021-03-05 MED ORDER — POTASSIUM CHLORIDE 20 MEQ PO PACK
40.0000 meq | PACK | Freq: Once | ORAL | Status: DC
  Filled 2021-03-05: qty 2

## 2021-03-05 MED ORDER — LORAZEPAM 1 MG PO TABS: 1.0000 mg | ORAL_TABLET | ORAL | Status: DC | PRN

## 2021-03-05 MED ORDER — NITROFURANTOIN MONOHYD MACRO 100 MG PO CAPS
100.0000 mg | ORAL_CAPSULE | Freq: Two times a day (BID) | ORAL | Status: AC
  Administered 2021-03-05 – 2021-03-12 (×14): 100 mg via ORAL
  Filled 2021-03-05 (×17): qty 1

## 2021-03-05 MED ORDER — OLANZAPINE 5 MG PO TBDP
5.0000 mg | ORAL_TABLET | Freq: Three times a day (TID) | ORAL | Status: DC | PRN
  Administered 2021-03-06: 5 mg via ORAL
  Filled 2021-03-05: qty 1

## 2021-03-05 MED ORDER — ZIPRASIDONE MESYLATE 20 MG IM SOLR: 20.0000 mg | INTRAMUSCULAR | Status: DC | PRN

## 2021-03-05 MED ORDER — OLANZAPINE 5 MG PO TABS
5.0000 mg | ORAL_TABLET | Freq: Two times a day (BID) | ORAL | Status: DC
  Administered 2021-03-05 – 2021-03-06 (×2): 5 mg via ORAL
  Filled 2021-03-05 (×7): qty 1

## 2021-03-05 MED ORDER — POTASSIUM CHLORIDE CRYS ER 20 MEQ PO TBCR
40.0000 meq | EXTENDED_RELEASE_TABLET | Freq: Once | ORAL | Status: AC
  Administered 2021-03-05: 40 meq via ORAL
  Filled 2021-03-05 (×2): qty 2

## 2021-03-05 MED ORDER — BENZTROPINE MESYLATE 0.5 MG PO TABS: 0.5000 mg | ORAL_TABLET | Freq: Two times a day (BID) | ORAL | Status: DC | PRN

## 2021-03-05 NOTE — Group Note (Signed)
Date:  03/05/2021 Time:  10:49 AM  Group Topic/Focus:  Orientation:   The focus of this group is to educate the patient on the purpose and policies of crisis stabilization and provide a format to answer questions about their admission.  The group details unit policies and expectations of patients while admitted.    Participation Level:  None  Participation Quality:    Affect:  Appropriate  Cognitive:  Lacking  Insight: Limited  Engagement in Group:  Poor  Modes of Intervention:  Discussion  Additional Comments:    Jerrye Beavers 03/05/2021, 10:49 AM

## 2021-03-05 NOTE — BHH Counselor (Signed)
CSW met with patient to attempt an assessment.  Patient tried to answer a couple of questions and was thought blocking and responding to internal stimuli.  Patient requested for this social worker to do assessment later today and wanted to go to the dayroom.  CSW honored request and agreed to come back after lunch.    Jonnell Hentges, LCSW, [place] Social Worker  [facility]
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
## 2021-03-05 NOTE — BHH Suicide Risk Assessment (Signed)
[facility] Admission Suicide Risk Assessment  Demographic factors:  stay-at-home mother, married Current Mental Status:  paranoid, AVH Loss Factors:  NA Historical Factors:  previous psychiatric diagnoses/treatments Risk Reduction Factors:  mother of 36, living with husband and children  Total Time Spent in Direct Patient Care:  I personally spent 45 minutes on the unit in direct patient care. The direct patient care time included face-to-face time with the patient, reviewing the patient's chart, communicating with other professionals, and coordinating care. Greater than 50% of this time was spent in counseling or coordinating care with the patient regarding goals of hospitalization, psycho-education, and discharge planning needs.  Principal Problem: Bipolar I disorder, current or most recent episode manic, severe with mood-congruent psychotic features ([place]) Diagnosis:  Principal Problem:   Bipolar I disorder, current or most recent episode manic, severe with mood-congruent psychotic features ([place]) Active Problems:   Bipolar 1 disorder ([place])  Subjective Data: In brief, the patient is a 46 year old female with history of bipolar disorder who presented as a walk in on 03/04/2021 for management of of hallucinations and bizarre behaviors at home.  Patient had been admitted to the behavioral health hospital in November 2022 for management of mania with psychotic features and at that time,was discharged home on Zyprexa 7.5 mg nightly and Cogentin 0.5 mg twice daily.  On exam today the patient cannot give much history due to her thought blocking and paranoia.  She admits to [place] but will not describe the content or frequency of the hallucinations.  She denies SI or HI.  She cannot describe what medications she has recently been taking prior to admission however collateral obtained at her intake from her husband was that her outpatient Zyprexa regimen had just recently been changed last Friday to Latuda 20 mg daily.   According to her admission notes she was telling her husband she was planning to leave him and take the children prior to admission.  The patient could not state how well she has been sleeping prior to hospitalization nor could she describe any neurovegetative symptoms of depression when questioned.  She had difficulty answering any questions about recent manic or hypomanic symptoms.  Continued Clinical Symptoms:  Alcohol Use Disorder Identification Test Final Score (AUDIT): 0 The "Alcohol Use Disorders Identification Test", Guidelines for Use in Primary Care, Second Edition.  World Pharmacologist [facility]). Score between 0-7:  no or low risk or alcohol related problems. Score between 8-15:  moderate risk of alcohol related problems. Score between 16-19:  high risk of alcohol related problems. Score 20 or above:  warrants further diagnostic evaluation for alcohol dependence and treatment.  CLINICAL FACTORS:   Bipolar Disorder diagnosis Currently Psychotic Previous Psychiatric Diagnoses and Treatments   Musculoskeletal: Strength & Muscle Tone: within normal limits Gait & Station: normal Patient leans: N/A  Psychiatric Specialty Exam:  Presentation  General Appearance: Appropriate for Environment; Fairly Groomed  Eye Contact:Fair  Speech:hypoverbal - non-spontaneous with thought blocking on exam and halting speech  Speech Volume:Decreased  Mood and Affect  Mood:anxious, paranoid  Affect:constricted, guarded   Thought Process  Thought Processes:thought blocking on exam   Orientation: oriented to month, year, city  Thought Content:endorses AVH and has thought blocking on exam suggestive of internal preoccupation; admits to paranoia and is guarded on exam; denies SI or HI but would not cooperate for additional thought content questioning  Hallucinations:endorses AVH but will not discuss content or frequency  Ideas of Reference:unable to assess due to lack of cooperation  for questioning  Suicidal Thoughts:Suicidal Thoughts: No  Homicidal Thoughts:Homicidal Thoughts: No   Sensorium  Memory:limited secondary to psychosis  Judgment:Poor  Insight:Poor   Executive Functions  Concentration:Poor  Attention Span:Poor  Recall:Poor  Fund of Knowledge:Poor  Language: Limited - hypoverbal with thought blocking    Psychomotor Activity  Psychomotor Activity:Psychomotor Activity: Psychomotor Retardation   Assets  Assets:Housing; Social Support  Physical Exam: Physical Exam Vitals reviewed.  HENT:     Head: Normocephalic.  Pulmonary:     Effort: Pulmonary effort is normal.  Neurological:     General: No focal deficit present.     Mental Status: She is alert.   ROS - uncooperative for questioning secondary to psychosis Blood pressure (!) 126/91, pulse (!) 114, temperature 98.6 F (37 C), temperature source Oral, resp. rate 18, height 5\' 7"  (1.702 m), weight 77.8 kg, SpO2 99 %. Body mass index is 26.88 kg/m.   COGNITIVE FEATURES THAT CONTRIBUTE TO RISK:  Loss of executive function    SUICIDE RISK:   Mild:  There are no identifiable plans, no associated intent, mild dysphoria and related symptoms, few other risk factors, and identifiable protective factors, including available and accessible social support.  PLAN OF CARE: see H&P  I certify that inpatient services furnished can reasonably be expected to improve the patient's condition.   Harlow Asa, MD, FAPA 03/05/2021, 6:24 PM

## 2021-03-05 NOTE — BHH Group Notes (Signed)
Patient did not attend the Wrap-up group. 

## 2021-03-05 NOTE — Progress Notes (Signed)
D- Patient alert and oriented x3. Pt appears to be disoriented to situation. PT has slights tremors. She denies SI, HI, and AVH. Pt appears to be preoccupied. When asked questions, she has a very delayed response. Pt has very little interaction with peers and staff and tends to stay in her room.   A- Scheduled medications administered to patient, per MD orders.Routine safety checks conducted every 15 minutes.  Patient informed to notify staff with problems or concerns.   R- Patient compliant with medications.   03/04/21 2100  Psych Admission Type (Psych Patients Only)  Admission Status Voluntary  Psychosocial Assessment  Patient Complaints Anxiety;Suspiciousness  Eye Contact Avertive  Facial Expression Blank  Affect Anxious;Sad;Flat  Speech Slow;Soft  Interaction No initiation;Hypervigilant;Guarded;Minimal  Motor Activity Fidgety;Pacing;Tremors  Appearance/Hygiene Unremarkable  Behavior Characteristics Anxious;Fidgety;Guarded;Resistant to care  Mood Anxious;Preoccupied  Thought Process  Coherency Disorganized  Content Paranoia  Delusions Paranoid  Perception Hallucinations  Hallucination Auditory  Judgment Poor  Confusion Mild  Danger to Self  Current suicidal ideation? Denies  Danger to Others  Danger to Others None reported or observed

## 2021-03-05 NOTE — Progress Notes (Signed)
The patient just came out of her bedroom for the second time in fifteen minutes. The first time, she requested sleep medication. On the second occasion, she would only state that she needed medicine.

## 2021-03-05 NOTE — Progress Notes (Signed)
Pt is guarded on approach and presents with blank stare.  Pt with limited conversation and does not initiate conversation.  Pt denies SI/HI/AVH although it appears that pt is responding to internal stimuli.  Pt's heart rate elevated, at 1800 it was 114 and BP was 126/91.

## 2021-03-06 ENCOUNTER — Encounter (HOSPITAL_COMMUNITY): Payer: Self-pay

## 2021-03-06 LAB — RAPID URINE DRUG SCREEN, HOSP PERFORMED
Amphetamines: NOT DETECTED
Barbiturates: NOT DETECTED
Benzodiazepines: NOT DETECTED
Cocaine: NOT DETECTED
Opiates: NOT DETECTED
Tetrahydrocannabinol: NOT DETECTED

## 2021-03-06 MED ORDER — ARIPIPRAZOLE 15 MG PO TABS
15.0000 mg | ORAL_TABLET | Freq: Every day | ORAL | Status: DC
  Administered 2021-03-07: 15 mg via ORAL
  Filled 2021-03-06 (×3): qty 1

## 2021-03-06 MED ORDER — BENZTROPINE MESYLATE 0.5 MG PO TABS
0.5000 mg | ORAL_TABLET | Freq: Two times a day (BID) | ORAL | Status: DC
  Administered 2021-03-06 – 2021-03-12 (×12): 0.5 mg via ORAL
  Filled 2021-03-06 (×17): qty 1

## 2021-03-06 MED ORDER — OLANZAPINE 5 MG PO TABS
5.0000 mg | ORAL_TABLET | Freq: Every day | ORAL | Status: DC
  Filled 2021-03-06: qty 1

## 2021-03-06 MED ORDER — OLANZAPINE 2.5 MG PO TABS: 2.5000 mg | ORAL_TABLET | Freq: Every day | ORAL | Status: DC

## 2021-03-06 MED ORDER — ARIPIPRAZOLE 10 MG PO TABS
10.0000 mg | ORAL_TABLET | Freq: Once | ORAL | Status: AC
  Administered 2021-03-06: 10 mg via ORAL
  Filled 2021-03-06 (×2): qty 1

## 2021-03-06 MED ORDER — OLANZAPINE 2.5 MG PO TABS
2.5000 mg | ORAL_TABLET | Freq: Every day | ORAL | Status: AC
  Administered 2021-03-06: 2.5 mg via ORAL
  Filled 2021-03-06 (×2): qty 1

## 2021-03-06 NOTE — Group Note (Signed)
LCSW Group Therapy Note  Group Date: 03/06/2021 Start Time: 1300 End Time: 1400   Type of Therapy and Topic:  Group Therapy: Anger Cues and Responses  Participation Level:  Did Not Attend   Description of Group:   In this group, patients learned how to recognize the physical, cognitive, emotional, and behavioral responses they have to anger-provoking situations.  They identified a recent time they became angry and how they reacted.  They analyzed how their reaction was possibly beneficial and how it was possibly unhelpful.  The group discussed a variety of healthier coping skills that could help with such a situation in the future.  Focus was placed on how helpful it is to recognize the underlying emotions to our anger, because working on those can lead to a more permanent solution as well as our ability to focus on the important rather than the urgent.  Therapeutic Goals: Patients will remember their last incident of anger and how they felt emotionally and physically, what their thoughts were at the time, and how they behaved. Patients will identify how their behavior at that time worked for them, as well as how it worked against them. Patients will explore possible new behaviors to use in future anger situations. Patients will learn that anger itself is normal and cannot be eliminated, and that healthier reactions can assist with resolving conflict rather than worsening situations.  Summary of Patient Progress:  Did not attend  Therapeutic Modalities:   Cognitive [place], LCSW 03/06/2021  1:49 PM

## 2021-03-06 NOTE — Group Note (Signed)
Date:  03/06/2021 Time:  10:59 AM  Group Topic/Focus:  Dimensions of Wellness:   The focus of this group is to introduce the topic of wellness and discuss the role each dimension of wellness plays in total health.    Participation Level:  Active  Participation Quality:  Appropriate  Affect:  Appropriate  Cognitive:  Appropriate  Insight: Appropriate  Engagement in Group:  Engaged  Modes of Intervention:  Discussion  Additional Comments:  Pt was engaged in group  Garvin Fila 03/06/2021, 10:59 AM

## 2021-03-06 NOTE — Progress Notes (Signed)
Pt denies SI/HI/AVH and verbally agrees to approach staff if these become apparent or before harming themselves/others. Rates depression 5/10. Rates anxiety 5/10. Rates pain 0/10. Pt has been flat and becomes anxious when being asked questions. Pt becomes fidgety. Student nurse talked to pt and the pt stated that she needed to get up and walk. Pt has been seen pacing the halls multiple times. Pt is guarded but will speak a little more when spoken to. Scheduled medications administered to pt, per MD orders. RN provided support and encouragement to pt. Q15 min safety checks implemented and continued. Pt safe on the unit. RN will continue to monitor and intervene as needed.   03/06/21 0753  Psych Admission Type (Psych Patients Only)  Admission Status Voluntary  Psychosocial Assessment  Patient Complaints Anxiety;Depression  Eye Contact Staring  Facial Expression Wide-eyed  Affect Sad;Flat;Blunted  Speech Soft;Slow  Interaction Forwards little;Guarded  Motor Activity Slow  Appearance/Hygiene Unremarkable  Behavior Characteristics Cooperative;Guarded;Pacing  Mood Suspicious;Empty;Sad  Thought Process  Coherency Disorganized  Content Paranoia  Delusions Paranoid  Perception Hallucinations  Hallucination Auditory  Judgment Poor  Confusion Mild  Danger to Self  Current suicidal ideation? Denies  Danger to Others  Danger to Others None reported or observed

## 2021-03-06 NOTE — BH IP Treatment Plan (Signed)
Interdisciplinary Treatment and Diagnostic Plan Update  03/06/2021 Time of Session: 9:30am  Janet Welch MRN: [id number]  Principal Diagnosis: Bipolar I disorder, current or most recent episode manic, severe with mood-congruent psychotic features ([place])  Secondary Diagnoses: Principal Problem:   Bipolar I disorder, current or most recent episode manic, severe with mood-congruent psychotic features ([place]) Active Problems:   Bipolar 1 disorder ([place])   Current Medications:  Current Facility-Administered Medications  Medication Dose Route Frequency Provider Last Rate Last Admin   acetaminophen (TYLENOL) tablet 650 mg  650 mg Oral Q6H PRN Ethelene Hal, NP       alum & mag hydroxide-simeth (MAALOX/MYLANTA) 200-200-20 MG/5ML suspension 30 mL  30 mL Oral Q4H PRN Ethelene Hal, NP       benztropine (COGENTIN) tablet 0.5 mg  0.5 mg Oral BID PRN Harlow Asa, MD       hydrOXYzine (ATARAX) tablet 25 mg  25 mg Oral TID PRN Ethelene Hal, NP   25 mg at 03/05/21 2110   OLANZapine zydis (ZYPREXA) disintegrating tablet 5 mg  5 mg Oral Q8H PRN Ethelene Hal, NP   5 mg at 03/06/21 0160   And   LORazepam (ATIVAN) tablet 1 mg  1 mg Oral PRN Ethelene Hal, NP       And   ziprasidone (GEODON) injection 20 mg  20 mg Intramuscular PRN Ethelene Hal, NP       magnesium hydroxide (MILK OF MAGNESIA) suspension 30 mL  30 mL Oral Daily PRN Ethelene Hal, NP       nitrofurantoin (macrocrystal-monohydrate) (MACROBID) capsule 100 mg  100 mg Oral Q12H Nkwenti, Doris, NP   100 mg at 03/06/21 0753   OLANZapine (ZYPREXA) tablet 5 mg  5 mg Oral BID Nicholes Rough, NP   5 mg at 03/06/21 0753   traZODone (DESYREL) tablet 50 mg  50 mg Oral QHS PRN Ethelene Hal, NP   50 mg at 03/05/21 2110   PTA Medications: Medications Prior to Admission  Medication Sig Dispense Refill Last Dose   eszopiclone (LUNESTA) 2 MG TABS tablet Take 1 tablet (2 mg total) by mouth at  bedtime. Take immediately before bedtime (Patient taking differently: Take 2 mg by mouth at bedtime. Take immediately before bedtime) 30 tablet 1    Magnesium 200 MG TABS Take 1 tablet (200 mg total) by mouth daily. Take with evening meal 30 tablet 1    OLANZapine (ZYPREXA) 7.5 MG tablet Take 1 tablet (7.5 mg total) by mouth at bedtime. 30 tablet 0    traZODone (DESYREL) 50 MG tablet Take 1 tablet (50 mg total) by mouth at bedtime as needed for sleep. 30 tablet 1     Patient Stressors: Marital or family conflict    Patient Strengths: Physical Health   Treatment Modalities: Medication Management, Group therapy, Case management,  1 to 1 session with clinician, Psychoeducation, Recreational therapy.   Physician Treatment Plan for Primary Diagnosis: Bipolar I disorder, current or most recent episode manic, severe with mood-congruent psychotic features ([place]) Long Term Goal(s): Improvement in symptoms so as ready for discharge   Short Term Goals: Ability to disclose and discuss suicidal ideas Ability to demonstrate self-control will improve Ability to identify and develop effective coping behaviors will improve Compliance with prescribed medications will improve  Medication Management: Evaluate patient's response, side effects, and tolerance of medication regimen.  Therapeutic Interventions: 1 to 1 sessions, Unit Group sessions and Medication administration.  Evaluation of Outcomes:  Not Met  Physician Treatment Plan for Secondary Diagnosis: Principal Problem:   Bipolar I disorder, current or most recent episode manic, severe with mood-congruent psychotic features ([place]) Active Problems:   Bipolar 1 disorder ([place])  Long Term Goal(s): Improvement in symptoms so as ready for discharge   Short Term Goals: Ability to disclose and discuss suicidal ideas Ability to demonstrate self-control will improve Ability to identify and develop effective coping behaviors will improve Compliance with  prescribed medications will improve     Medication Management: Evaluate patient's response, side effects, and tolerance of medication regimen.  Therapeutic Interventions: 1 to 1 sessions, Unit Group sessions and Medication administration.  Evaluation of Outcomes: Not Met   RN Treatment Plan for Primary Diagnosis: Bipolar I disorder, current or most recent episode manic, severe with mood-congruent psychotic features ([place]) Long Term Goal(s): Knowledge of disease and therapeutic regimen to maintain health will improve  Short Term Goals: Ability to remain free from injury will improve, Ability to participate in decision making will improve, Ability to verbalize feelings will improve, Ability to disclose and discuss suicidal ideas, and Ability to identify and develop effective coping behaviors will improve  Medication Management: RN will administer medications as ordered by provider, will assess and evaluate patient's response and provide education to patient for prescribed medication. RN will report any adverse and/or side effects to prescribing provider.  Therapeutic Interventions: 1 on 1 counseling sessions, Psychoeducation, Medication administration, Evaluate responses to treatment, Monitor vital signs and CBGs as ordered, Perform/monitor CIWA, COWS, AIMS and Fall Risk screenings as ordered, Perform wound care treatments as ordered.  Evaluation of Outcomes: Not Met   LCSW Treatment Plan for Primary Diagnosis: Bipolar I disorder, current or most recent episode manic, severe with mood-congruent psychotic features ([place]) Long Term Goal(s): Safe transition to appropriate next level of care at discharge, Engage patient in therapeutic group addressing interpersonal concerns.  Short Term Goals: Engage patient in aftercare planning with referrals and resources, Increase social support, Increase emotional regulation, Facilitate acceptance of mental health diagnosis and concerns, Identify triggers  associated with mental health/substance abuse issues, and Increase skills for wellness and recovery  Therapeutic Interventions: Assess for all discharge needs, 1 to 1 time with Social worker, Explore available resources and support systems, Assess for adequacy in community support network, Educate family and significant other(s) on suicide prevention, Complete Psychosocial Assessment, Interpersonal group therapy.  Evaluation of Outcomes: Not Met   Progress in Treatment: Attending groups: Yes. Participating in groups: Yes. Taking medication as prescribed: Yes. Toleration medication: Yes. Family/Significant other contact made: Yes, individual(s) contacted:  Husband  Patient understands diagnosis: No. Discussing patient identified problems/goals with staff: Yes. Medical problems stabilized or resolved: Yes. Denies suicidal/homicidal ideation: Yes. Issues/concerns per patient self-inventory: No.   New problem(s) identified: No, Describe:  None   New Short Term/Long Term Goal(s): medication stabilization, elimination of SI thoughts, development of comprehensive mental wellness plan.   Patient Goals: "To manage my anxiety"   Discharge Plan or Barriers: Patient recently admitted. CSW will continue to follow and assess for appropriate referrals and possible discharge planning.   Reason for Continuation of Hospitalization: Anxiety Delusions  Hallucinations Medication stabilization  Estimated Length of Stay: 3 to 5 days    Scribe for Treatment Team: Darleen Crocker, Latanya Presser 03/06/2021 3:05 PM

## 2021-03-06 NOTE — Progress Notes (Addendum)
[facility] MD Progress Note  03/06/2021 4:05 PM Janet Welch  MRN:  [id number]  Subjective:  Janet Welch states: "I was jittery and having almost a panic attack and told my husband that I had to go in." This is in response to her understanding of the reason for this hospitalization.   Daily Notes, 03/06/2021: Pt's chart reviewed, case discussed with her treatment team. For this encounter, pt is seen with attending Physician. Pt with flat affect and depressed mood, attention to personal hygiene and grooming is fair, eye contact is good, speech is clear & coherent. Thought contents are organized and logical, and pt currently denies SI/HI/VH or paranoia. She endorses +AH of voices in her head having conversations and making comments. She reports that a couple of nights ago, she thought that people were taking thoughts out of her head and putting some back in, but denies feeling that way currently. Pt denies currently being paranoid, states that yesterday she was, but not today. There is no evidence of delusional thoughts.  Pt reports that her sleep quality last night was good, even though staff reports that she slept just for an hour. She also reports a good appetite, and is more interactive today with her peers and staff. She does not have thought blocking today.  Attending Physician discussed medications with pt, and asked she had been taking her Zyprexa prescribed during the last admission in November. Pt reports that she was taking the medication, and "was feeling like my limbs are heavy", and had extreme tiredness. She reports that this is why her outpatient mental health provider decided to change the medication to [facility]. Pt educated by attending Psychiatrist on Abilify, and that it is more prolactin sparing, and that it is also less sedating than Zyprexa. Pt is agreeable to trying this medication. Patient has received Zyprexa 5mg  this morning and we will give 2.5mg  tonight and reduce Zyprexa to 5mg  tomorrow night and  then stop. We will give Abilify 10mg  today titrating up to 15mg  tomorrow. Will also start Cogentin 0.5 mg BID to assist, with extrapyramidal side effects. Pt observed to have small amounts of tremor to her tongue.  V/S are WNL. Potassium at admission was 3.0, will 40 MEQ of Potassium given, repeat  CMP ordered, labs not drawn. This has been rescheduled for tomorrow morning. Pt being treated with Macrobid 100 mg BID x 7 days for a urinary tract infection.  Reason for Admission: [name]. Janet Welch is a 46 y.o female with a history of bipolar disorder who was walked voluntarily to this [facility] by her husband on 03/04/21 for hallucinations and bizarre behaviors at home. This is pt's 3rd admission to this [facility]. Pt was admitted for treatment and stabilization of her mood.  Principal Problem: Bipolar I disorder, current or most recent episode manic, severe with mood-congruent psychotic features ([place]) Diagnosis: Principal Problem:   Bipolar I disorder, current or most recent episode manic, severe with mood-congruent psychotic features ([place]) Active Problems:   Bipolar 1 disorder ([place])  Total Time spent with patient: 20 minutes  Past Psychiatric History: Bipolar 1 disorder  Past Medical History:  Past Medical History:  Diagnosis Date   Bipolar 1 disorder ([place])    No pertinent past medical history     Past Surgical History:  Procedure Laterality Date   CESAREAN SECTION N/A 07/07/2013   Procedure: CESAREAN SECTION;  Surgeon: Linda Hedges, DO;  Location: [facility];  Service: Obstetrics;  Laterality: N/A;   LEG SKIN LESION  BIOPSY /  EXCISION     scar tissue biopsy     Family History:  Family History  Problem Relation Age of Onset   Thyroid disease Mother    Mental illness Mother    Parkinson's disease Father    Stroke Maternal Grandmother    Stroke Maternal Grandfather    Family Psychiatric  History: As above Social History:  Social History   Substance and Sexual Activity  Alcohol Use Yes    Alcohol/week: 0.0 - 1.0 standard drinks   Comment: socially     Social History   Substance and Sexual Activity  Drug Use Not Currently   Types: Marijuana    Social History   Socioeconomic History   Marital status: Married    Spouse name: Not on file   Number of children: Not on file   Years of education: Not on file   Highest education level: Not on file  Occupational History    Comment: [employer]  Tobacco Use   Smoking status: Never   Smokeless tobacco: Never  Vaping Use   Vaping Use: Never used  Substance and Sexual Activity   Alcohol use: Yes    Alcohol/week: 0.0 - 1.0 standard drinks    Comment: socially   Drug use: Not Currently    Types: Marijuana   Sexual activity: Yes    Birth control/protection: None  Other Topics Concern   Not on file  Social History Narrative   Not on file   Social Determinants of Health   Financial Resource Strain: Not on file  Food Insecurity: Not on file  Transportation Needs: Not on file  Physical Activity: Not on file  Stress: Not on file  Social Connections: Not on file   Additional Social History:    Pain Medications: See MAR Prescriptions: See MAR Over the Counter: See MAR History of alcohol / drug use?: No history of alcohol / drug abuse Longest period of sobriety (when/how long): NA   Sleep: Poor  Appetite:  Fair  Current Medications: Current Facility-Administered Medications  Medication Dose Route Frequency Provider Last Rate Last Admin   acetaminophen (TYLENOL) tablet 650 mg  650 mg Oral Q6H PRN Ethelene Hal, NP       alum & mag hydroxide-simeth (MAALOX/MYLANTA) 200-200-20 MG/5ML suspension 30 mL  30 mL Oral Q4H PRN Ethelene Hal, NP       ARIPiprazole (ABILIFY) tablet 10 mg  10 mg Oral Once Harlow Asa, MD       [START ON 03/07/2021] ARIPiprazole (ABILIFY) tablet 15 mg  15 mg Oral Daily Nelda Marseille, Cayson Kalb E, MD       benztropine (COGENTIN) tablet 0.5 mg  0.5 mg Oral BID Bethsaida Siegenthaler E, MD        hydrOXYzine (ATARAX) tablet 25 mg  25 mg Oral TID PRN Ethelene Hal, NP   25 mg at 03/05/21 2110   OLANZapine zydis (ZYPREXA) disintegrating tablet 5 mg  5 mg Oral Q8H PRN Ethelene Hal, NP   5 mg at 03/06/21 1610   And   LORazepam (ATIVAN) tablet 1 mg  1 mg Oral PRN Ethelene Hal, NP       And   ziprasidone (GEODON) injection 20 mg  20 mg Intramuscular PRN Ethelene Hal, NP       magnesium hydroxide (MILK OF MAGNESIA) suspension 30 mL  30 mL Oral Daily PRN Ethelene Hal, NP       nitrofurantoin (macrocrystal-monohydrate) (MACROBID) capsule 100 mg  100 mg Oral  Q12H Nicholes Rough, NP   100 mg at 03/06/21 0753   OLANZapine (ZYPREXA) tablet 2.5 mg  2.5 mg Oral QHS Harlow Asa, MD       [START ON 03/07/2021] OLANZapine (ZYPREXA) tablet 5 mg  5 mg Oral QHS Harlow Asa, MD       traZODone (DESYREL) tablet 50 mg  50 mg Oral QHS PRN Ethelene Hal, NP   50 mg at 03/05/21 2110    Lab Results:  Results for orders placed or performed during the hospital encounter of 03/04/21 (from the past 48 hour(s))  Urinalysis, Complete w Microscopic Urine, Clean Catch     Status: Abnormal   Collection Time: 03/05/21  6:16 AM  Result Value Ref Range   Color, Urine YELLOW YELLOW   APPearance HAZY (A) CLEAR   Specific Gravity, Urine 1.011 1.005 - 1.030   pH 6.0 5.0 - 8.0   Glucose, UA NEGATIVE NEGATIVE mg/dL   Hgb urine dipstick LARGE (A) NEGATIVE   Bilirubin Urine NEGATIVE NEGATIVE   Ketones, ur 5 (A) NEGATIVE mg/dL   Protein, ur NEGATIVE NEGATIVE mg/dL   Nitrite NEGATIVE NEGATIVE   Leukocytes,Ua LARGE (A) NEGATIVE   RBC / HPF 21-50 0 - 5 RBC/hpf   WBC, UA 6-10 0 - 5 WBC/hpf   Bacteria, UA RARE (A) NONE SEEN   Squamous Epithelial / LPF 0-5 0 - 5   Mucus PRESENT     Comment: Performed at [facility], [place] [address], [place], [place] [address]  Pregnancy, urine     Status: None   Collection Time: 03/05/21  6:16 AM  Result Value  Ref Range   Preg Test, Ur NEGATIVE NEGATIVE    Comment:        THE SENSITIVITY OF THIS METHODOLOGY IS >20 mIU/mL. Performed at [facility], [place] [address], [place], [place] [address]   Rapid urine drug screen (hospital performed)     Status: None   Collection Time: 03/05/21  6:16 AM  Result Value Ref Range   Opiates NONE DETECTED NONE DETECTED   Cocaine NONE DETECTED NONE DETECTED   Benzodiazepines NONE DETECTED NONE DETECTED   Amphetamines NONE DETECTED NONE DETECTED   Tetrahydrocannabinol NONE DETECTED NONE DETECTED   Barbiturates NONE DETECTED NONE DETECTED    Comment: (NOTE) DRUG SCREEN FOR MEDICAL PURPOSES ONLY.  IF CONFIRMATION IS NEEDED FOR ANY PURPOSE, NOTIFY LAB WITHIN 5 DAYS.  LOWEST DETECTABLE LIMITS FOR URINE DRUG SCREEN Drug Class                     Cutoff (ng/mL) Amphetamine and metabolites    1000 Barbiturate and metabolites    200 Benzodiazepine                 528 Tricyclics and metabolites     300 Opiates and metabolites        300 Cocaine and metabolites        300 THC                            50 Performed at [facility], [place] [address] [place], [place] [address]   CBC     Status: None   Collection Time: 03/05/21  6:21 AM  Result Value Ref Range   WBC 6.2 4.0 - 10.5 K/uL   RBC 4.81 3.87 - 5.11 MIL/uL   Hemoglobin 14.1 12.0 - 15.0 g/dL  HCT 43.0 36.0 - 46.0 %   MCV 89.4 80.0 - 100.0 fL   MCH 29.3 26.0 - 34.0 pg   MCHC 32.8 30.0 - 36.0 g/dL   RDW 12.7 11.5 - 15.5 %   Platelets 283 150 - 400 K/uL   nRBC 0.0 0.0 - 0.2 %    Comment: Performed at [facility], [place] [address], [place], [place] [address]  Comprehensive metabolic panel     Status: Abnormal   Collection Time: 03/05/21  6:21 AM  Result Value Ref Range   Sodium 137 135 - 145 mmol/L   Potassium 3.0 (L) 3.5 - 5.1 mmol/L   Chloride 103 98 - 111 mmol/L   CO2 24 22 - 32 mmol/L   Glucose, Bld 113 (H) 70 - 99 mg/dL    Comment:  Glucose reference range applies only to samples taken after fasting for at least 8 hours.   BUN 10 6 - 20 mg/dL   Creatinine, Ser 0.71 0.44 - 1.00 mg/dL   Calcium 8.9 8.9 - 10.3 mg/dL   Total Protein 7.0 6.5 - 8.1 g/dL   Albumin 4.1 3.5 - 5.0 g/dL   AST 15 15 - 41 U/L   ALT 15 0 - 44 U/L   Alkaline Phosphatase 48 38 - 126 U/L   Total Bilirubin 0.7 0.3 - 1.2 mg/dL   GFR, Estimated >60 >60 mL/min    Comment: (NOTE) Calculated using the CKD-EPI Creatinine Equation (2021)    Anion gap 10 5 - 15    Comment: Performed at [facility], [place] [address], [place], [place] [address]  Hemoglobin A1c     Status: None   Collection Time: 03/05/21  6:21 AM  Result Value Ref Range   Hgb A1c MFr Bld 4.9 4.8 - 5.6 %    Comment: (NOTE) Pre diabetes:          5.7%-6.4%  Diabetes:              >6.4%  Glycemic control for   <7.0% adults with diabetes    Mean Plasma Glucose 93.93 mg/dL    Comment: Performed at [place] [address] [place], [place] [address]  Ethanol     Status: None   Collection Time: 03/05/21  6:21 AM  Result Value Ref Range   Alcohol, Ethyl (B) <10 <10 mg/dL    Comment: (NOTE) Lowest detectable limit for serum alcohol is 10 mg/dL.  For medical purposes only. Performed at [facility], [place] [address], [place], [place] [address]   Lipid panel     Status: None   Collection Time: 03/05/21  6:21 AM  Result Value Ref Range   Cholesterol 160 0 - 200 mg/dL   Triglycerides 54 <150 mg/dL   HDL 61 >40 mg/dL   Total CHOL/HDL Ratio 2.6 RATIO   VLDL 11 0 - 40 mg/dL   LDL Cholesterol 88 0 - 99 mg/dL    Comment:        Total Cholesterol/HDL:CHD Risk Coronary Heart Disease Risk Table                     Men   Women  1/2 Average Risk   3.4   3.3  Average Risk       5.0   4.4  2 X Average Risk   9.6   7.1  3 X Average Risk  23.4   11.0  Use the calculated Patient Ratio above and the CHD Risk Table to determine the  patient's CHD Risk.        ATP III CLASSIFICATION (LDL):  <100     mg/dL   Optimal  100-129  mg/dL   Near or Above                    Optimal  130-159  mg/dL   Borderline  160-189  mg/dL   High  >190     mg/dL   Very High Performed at [place] [address] [place], [place] [address]   TSH     Status: None   Collection Time: 03/05/21  6:21 AM  Result Value Ref Range   TSH 1.407 0.350 - 4.500 uIU/mL    Comment: Performed by a 3rd Generation assay with a functional sensitivity of <=0.01 uIU/mL. Performed at [facility], [place] [address], [place],  [address]     Blood Alcohol level:  Lab Results  Component Value Date   ETH <10 03/05/2021   ETH <10 09/38/1829    Metabolic Disorder Labs: Lab Results  Component Value Date   HGBA1C 4.9 03/05/2021   MPG 93.93 03/05/2021   MPG 99.67 11/22/2020   Lab Results  Component Value Date   PROLACTIN 27.6 (H) 11/27/2020   PROLACTIN 31.8 (H) 04/02/2017   Lab Results  Component Value Date   CHOL 160 03/05/2021   TRIG 54 03/05/2021   HDL 61 03/05/2021   CHOLHDL 2.6 03/05/2021   VLDL 11 03/05/2021   LDLCALC 88 03/05/2021   LDLCALC 79 11/27/2020    Physical Findings: AIMS: Facial and Oral Movements Muscles of Facial Expression: None, normal Lips and Perioral Area: None, normal Jaw: None, normal Tongue: None, normal,Extremity Movements Upper (arms, wrists, hands, fingers): None, normal Lower (legs, knees, ankles, toes): None, normal, Trunk Movements Neck, shoulders, hips: None, normal, Overall Severity Severity of abnormal movements (highest score from questions above): None, normal Incapacitation due to abnormal movements: None, normal Patient's awareness of abnormal movements (rate only patient's report): No Awareness, Dental Status Current problems with teeth and/or dentures?: No Does patient usually wear dentures?: No  CIWA: n/a COWS:   n/a AIMS-4  Musculoskeletal: Strength & Muscle Tone: within normal limits Gait & Station: normal Patient leans: N/A  Psychiatric Specialty Exam:  Presentation  General Appearance: Appropriate for Environment; Fairly Groomed  Eye Contact:Good  Speech:more spontaneous and fluent, clear and coherent  Speech Volume:Normal  Handedness:Right  Mood and Affect  Mood:Depressed  Affect:Flat  Thought Process  Thought Processes:more linear, no thought blocking today, still evasive and vague  Orientation:Full (Time, Place and Person)  Thought Content:Appears less paranoid on exam and no longer has thought blocking; reports residual AH this morning but denies VH, ideas of reference, or first rank symptoms, denies SI or HI  History of Schizophrenia/Schizoaffective disorder:No  Duration of Psychotic Symptoms:Greater than six months  Hallucinations:AH making conversation - non-command in nature  Ideas of Reference:None  Suicidal Thoughts:Suicidal Thoughts: No  Homicidal Thoughts:Homicidal Thoughts: No  Sensorium  Memory:Poor  Judgment:Fair  Insight:Shallow  Executive Functions  Concentration:Fair  Attention Span:Fair  Recall:Poor  Fund of Knowledge:Fair  Language:Improved  Psychomotor Activity  Psychomotor Activity:No cogwheeling, no stiffness, no involuntary movements of trunk, lips, face, or extremities but with mild tremor to tongue when tongue protruded, no chewing, grimacing, or puckering.  Assets  Assets:Communication Skills; Housing; Social Support  Sleep  6hrs 15 min   Physical Exam  HENT:     Head: Normocephalic.     Nose: Nose normal.  Eyes:     Pupils: Pupils are equal, round, and reactive to light.  Pulmonary:     Effort: Pulmonary effort is normal. No respiratory distress.  Musculoskeletal:        General: Normal range of motion.     Cervical back: Normal range of motion. No rigidity.  Neurological:     Mental Status: She is alert and  oriented to person, place, and time.     Sensory: No sensory deficit.     Coordination: Coordination normal.  Psychiatric:        Thought Content: Thought content normal.   Review of Systems  Constitutional: Negative.  Negative for fever.  HENT: Negative.  Negative for sore throat.   Eyes: Negative.   Respiratory: Negative.  Negative for cough, shortness of breath and wheezing.   Cardiovascular: Negative.  Negative for chest pain.  Gastrointestinal: Negative.  Negative for heartburn, nausea and vomiting.  Genitourinary: Negative.   Musculoskeletal: Negative.   Skin: Negative.   Neurological:  Negative for headaches.  Endo/Heme/Allergies:        Allergies Bee Venon  Psychiatric/Behavioral:  Positive for depression. The patient has insomnia.   Blood pressure 117/75, pulse (!) 111, temperature 97.9 F (36.6 C), temperature source Oral, resp. rate 18, height 5\' 7"  (1.702 m), weight 77.8 kg, SpO2 99 %. Body mass index is 26.88 kg/m.  PLAN Safety and Monitoring: Voluntary admission to inpatient psychiatric unit for safety, stabilization and treatment Daily contact with patient to assess and evaluate symptoms and progress in treatment Patient's case to be discussed in multi-disciplinary team meeting Observation Level : q15 minute checks Vital signs: q12 hours Precautions: suicide, elopement, and assault   Bipolar I disorder, current or most recent episode manic, severe with mood-congruent psychotic features ([place]) -Taper off Zyprexa (received 5mg  this morning and will receive 2.5mg  qhs tonight then reduce to 5mg  qhs tomorrow then stop) -Start Abilify (10 mg today X 1 dose, then 15 mg daily starting tomorrow-2/16) -Start Cogentin 0.5 mg BID for extrapyramidal side effects - Lipid panel WNL, A1c 4.9, QTC 462ms   Insomnia -Continue Trazodone 50mg  nightly PRN   Potassium of 3.0 -One time dose of K 40 MEQ completed. To repeat BMP on 03/07/21   Anxiety -Continue Hydroxyzine 25 mg  every 6 hours PRN   UTI -Continue Macrobid 100 mg BID X 7days - Urine culture pending   Other PRNS -Continue Tylenol 650 mg every 6 hours PRN for mild pain -Continue Maalox 30 mg every 4 hrs PRN for indigestion -Continue Milk of Magnesia as needed every 6 hrs for constipation -Continue Agitation protocol PRN (see MAR)   Long Term Goal(s): Improvement in symptoms so as ready for discharge   Short Term Goals: Ability to disclose and discuss suicidal ideas, Ability to demonstrate self-control will improve, Ability to identify and develop effective coping behaviors will improve, and Compliance with prescribed medications will improve   Physician Treatment Plan for Secondary Diagnosis:  Principal Problem:   Bipolar I disorder, current or most recent episode manic, severe with mood-congruent psychotic features ([place]) Active Problems:   Bipolar 1 disorder ([place])   Discharge Planning: Social work and case management to assist with discharge planning and identification of hospital follow-up needs prior to discharge Estimated LOS: 5-7 days Discharge Concerns: Need to establish a safety plan; Medication compliance and effectiveness Discharge Goals: Return home with outpatient referrals for mental health follow-up including  medication management/psychotherapy  Nicholes Rough, NP 03/06/2021, 4:05 PM

## 2021-03-06 NOTE — Plan of Care (Addendum)
I tried to reach the patient's husband [phone]) at 4:00pm but call went straight to voicemail. Patient has given consent for [place] to contact her spouse for safety planning and collateral. I left a HIPPA compliant VM on his phone requesting a call back.   Husband returned call 10 min later. I discussed his wife's recent symptoms. He feels that she has not been herself since last September. Per husband, after her November 2022 discharge he did not feel she was ready to come home. He states that since November she has  intermittently still been hearing and seeing things and accusing him of cheating on her. He thinks she has been compliant with her medications but admits she has resisted him checking on her medication compliance.  He does not feel Zyprexa was beneficial.  He is unsure why her outpatient psychiatrist was making med changes and states he did not know meds were being changed until he went to fill her meds and he had a script for $250 copay for Latuda to pickup. He thinks she has not been sleeping consistently and they went to see her PCP who recently changed her sleep med - he is not sure what med was prescribed.   Per husband, she  started appearing more paranoid in the last week. He states that she started telling the children that they were not safe in the home. He feels she was getting more aggressive which is uncharacteristic of her. He states that her paranoia caused the children to become afraid of her. He states that other than lack of sleep she has not been talking rapidly, been spending, been more sexual, been overly energetic,or been more impulsive. He states she has a telepsychiatrist out of [place] but has not had any recent therapist.   We discussed a differential diagnosis and plans to transition her to Abilify. I advised that if she responds to Abilify that there is a LAI which may help with compliance. He requests that we proceed with any neuroimaging or additional labs for  psychosis w/u given her persistent symptoms. I advised that I will talk with her about these options and see if she will consent to testing. Supportive encouragement provided to spouse.   Viann Fish, MD, [facility]

## 2021-03-06 NOTE — Progress Notes (Signed)
D- Patient alert but appears to be disoriented to situation and time. Pt has been unable to sleep and has been walking around in her room during bedtime. Pt is wide-eyed and has very minimal speech. Pt denies any complaints but appears to be responding to internal stimuli.  A- Pt given PRN zyprexa. Routine safety checks conducted every 15 minutes.  Patient informed to notify staff with problems or concerns.   R- Patient compliant with medications.

## 2021-03-06 NOTE — [facility] Counselor (Signed)
CSW attempted to complete assessment and unsuccessful.  Patient was too anxious and disorganized and requested for CSW to come back tomorrow.  CSW able to get consents signed for patient husband, [name].    Tadd Holtmeyer, LCSW, [place] Social Worker  [facility]

## 2021-03-06 NOTE — Progress Notes (Signed)
[facility] MD Progress Note  03/07/2021 6:08 PM Janet Welch  MRN:  [id number]  Chief Complaint: paranoia and psychosis  Janet Welch is a 46 y.o. female with a history of bipolar d/o, who was initially admitted for inpatient psychiatric hospitalization on 03/04/2021 for management of paranoia and psychosis. The patient is currently on Hospital Day 3.   Chart Review from last 24 hours:  The patient's chart was reviewed and nursing notes were reviewed. The patient's case was discussed in multidisciplinary team meeting.  Per nursing, the patient was somewhat anxious yesterday and was seen pacing at times on the unit.  She was somewhat guarded but had no acute safety or behavioral issues noted.  She attended select groups.  Per MAR she was compliant with scheduled medications and did receive trazodone x1 for sleep.  Information Obtained Today During Patient Interview: The patient was seen and evaluated on the unit. On assessment today the patient reports that she is feeling more paranoid today for unknown reasons.  She states that she slept well overnight and denies AVH.  She appears more guarded with some return of thought blocking on exam today.  She denies first rank symptoms or ideas of reference and denies SI or HI.  She admits that she has a "urge to move" since starting the Abilify and does feel that she needs to pace.  She reports stable appetite and voices no other physical complaints.  We discussed collateral from her husband and she is willing to proceed with a noncontrast head CT and some additional psychosis lab work-up to rule out a medical etiology to her persistent symptoms.  I discussed potential for HIV and RPR testing as well as sed rate and ANA heavy metal and ceruloplasmin she agrees to these labs.  We discussed the fact that today she appears to have decompensated compared to our conversation yesterday and after extensive discussion she is willing to resume Zyprexa if the dose is given at  bedtime to reduce daytime sedation.  On assessment today she has no further tremor of her tongue noted on exam and no signs of involuntary movements.  We discussed that some of her internal restlessness may be related to the start of Abilify.  Principal Problem: Bipolar affective disorder, current episode manic with psychotic symptoms ([place]) Diagnosis: Principal Problem:   Bipolar affective disorder, current episode manic with psychotic symptoms ([place])  Total Time Spent in Direct Patient Care:  I personally spent 30 minutes on the unit in direct patient care. The direct patient care time included face-to-face time with the patient, reviewing the patient's chart, communicating with other professionals, and coordinating care. Greater than 50% of this time was spent in counseling or coordinating care with the patient regarding goals of hospitalization, psycho-education, and discharge planning needs.  Past Psychiatric History: see H&P  Past Medical History:  Past Medical History:  Diagnosis Date   Bipolar 1 disorder ([place])    No pertinent past medical history     Past Surgical History:  Procedure Laterality Date   CESAREAN SECTION N/A 07/07/2013   Procedure: CESAREAN SECTION;  Surgeon: Linda Hedges, DO;  Location: [facility];  Service: Obstetrics;  Laterality: N/A;   LEG SKIN LESION  BIOPSY / EXCISION     scar tissue biopsy     Family History:  Family History  Problem Relation Age of Onset   Thyroid disease Mother    Mental illness Mother    Parkinson's disease Father    Stroke Maternal Grandmother  Stroke Maternal Grandfather    Family Psychiatric  History: see H&P  Social History:  Social History   Substance and Sexual Activity  Alcohol Use Yes   Alcohol/week: 0.0 - 1.0 standard drinks   Comment: socially     Social History   Substance and Sexual Activity  Drug Use Not Currently   Types: Marijuana    Social History   Socioeconomic History   Marital status: Married     Spouse name: Not on file   Number of children: Not on file   Years of education: Not on file   Highest education level: Not on file  Occupational History    Comment: [employer]  Tobacco Use   Smoking status: Never   Smokeless tobacco: Never  Vaping Use   Vaping Use: Never used  Substance and Sexual Activity   Alcohol use: Yes    Alcohol/week: 0.0 - 1.0 standard drinks    Comment: socially   Drug use: Not Currently    Types: Marijuana   Sexual activity: Yes    Birth control/protection: None  Other Topics Concern   Not on file  Social History Narrative   Not on file   Social Determinants of Health   Financial Resource Strain: Not on file  Food Insecurity: Not on file  Transportation Needs: Not on file  Physical Activity: Not on file  Stress: Not on file  Social Connections: Not on file   Additional Social History:    Pain Medications: See MAR Prescriptions: See MAR Over the Counter: See MAR History of alcohol / drug use?: No history of alcohol / drug abuse Longest period of sobriety (when/how long): NA  Sleep: Good  Appetite:  Good  Current Medications: Current Facility-Administered Medications  Medication Dose Route Frequency Provider Last Rate Last Admin   acetaminophen (TYLENOL) tablet 650 mg  650 mg Oral Q6H PRN Ethelene Hal, NP       alum & mag hydroxide-simeth (MAALOX/MYLANTA) 200-200-20 MG/5ML suspension 30 mL  30 mL Oral Q4H PRN Ethelene Hal, NP       benztropine (COGENTIN) tablet 0.5 mg  0.5 mg Oral BID Lonnetta Kniskern E, MD   0.5 mg at 03/07/21 1732   hydrOXYzine (ATARAX) tablet 25 mg  25 mg Oral TID PRN Ethelene Hal, NP   25 mg at 03/05/21 2110   OLANZapine zydis (ZYPREXA) disintegrating tablet 5 mg  5 mg Oral Q8H PRN Ethelene Hal, NP   5 mg at 03/06/21 5035   And   LORazepam (ATIVAN) tablet 1 mg  1 mg Oral PRN Ethelene Hal, NP       And   ziprasidone (GEODON) injection 20 mg  20 mg Intramuscular PRN Ethelene Hal, NP       magnesium hydroxide (MILK OF MAGNESIA) suspension 30 mL  30 mL Oral Daily PRN Ethelene Hal, NP       nitrofurantoin (macrocrystal-monohydrate) (MACROBID) capsule 100 mg  100 mg Oral Q12H Nkwenti, Doris, NP   100 mg at 03/07/21 0837   OLANZapine (ZYPREXA) tablet 10 mg  10 mg Oral QHS Viann Fish E, MD       traZODone (DESYREL) tablet 50 mg  50 mg Oral QHS PRN Ethelene Hal, NP   50 mg at 03/06/21 2125    Lab Results:  Results for orders placed or performed during the hospital encounter of 03/04/21 (from the past 48 hour(s))  Basic metabolic panel     Status: None  Collection Time: 03/07/21  6:10 AM  Result Value Ref Range   Sodium 140 135 - 145 mmol/L   Potassium 4.1 3.5 - 5.1 mmol/L    Comment: DELTA CHECK NOTED NO VISIBLE HEMOLYSIS    Chloride 104 98 - 111 mmol/L   CO2 29 22 - 32 mmol/L   Glucose, Bld 94 70 - 99 mg/dL    Comment: Glucose reference range applies only to samples taken after fasting for at least 8 hours.   BUN 14 6 - 20 mg/dL   Creatinine, Ser 0.88 0.44 - 1.00 mg/dL   Calcium 8.9 8.9 - 10.3 mg/dL   GFR, Estimated >60 >60 mL/min    Comment: (NOTE) Calculated using the CKD-EPI Creatinine Equation (2021)    Anion gap 7 5 - 15    Comment: Performed at [facility], [place] [address], [place], [place] [address]    Blood Alcohol level:  Lab Results  Component Value Date   ETH <10 03/05/2021   ETH <10 37/90/2409    Metabolic Disorder Labs: Lab Results  Component Value Date   HGBA1C 4.9 03/05/2021   MPG 93.93 03/05/2021   MPG 99.67 11/22/2020   Lab Results  Component Value Date   PROLACTIN 27.6 (H) 11/27/2020   PROLACTIN 31.8 (H) 04/02/2017   Lab Results  Component Value Date   CHOL 160 03/05/2021   TRIG 54 03/05/2021   HDL 61 03/05/2021   CHOLHDL 2.6 03/05/2021   VLDL 11 03/05/2021   LDLCALC 88 03/05/2021   LDLCALC 79 11/27/2020    Physical Findings: AIMS: Facial and Oral  Movements Muscles of Facial Expression: None, normal Lips and Perioral Area: None, normal Jaw: None, normal Tongue: None, normal,Extremity Movements Upper (arms, wrists, hands, fingers): None, normal Lower (legs, knees, ankles, toes): None, normal, Trunk Movements Neck, shoulders, hips: None, normal, Overall Severity Severity of abnormal movements (highest score from questions above): None, normal Incapacitation due to abnormal movements: None, normal Patient's awareness of abnormal movements (rate only patient's report): No Awareness, Dental Status Current problems with teeth and/or dentures?: No Does patient usually wear dentures?: No   Musculoskeletal: Strength & Muscle Tone: within normal limits Gait & Station: normal Patient leans: N/A  Psychiatric Specialty Exam:  Presentation  General Appearance: Appropriate for Environment; Fairly Groomed  Eye Contact:Good  Speech:Clear and Coherent  Speech Volume:Normal  Handedness:Right   Mood and Affect  Mood:Depressed  Affect:Flat   Thought Process  Thought Processes:Coherent  Descriptions of Associations:Intact  Orientation:Full (Time, Place and Person)  Thought Content:Logical  History of Schizophrenia/Schizoaffective disorder:No  Duration of Psychotic Symptoms:Greater than six months  Hallucinations:No data recorded  Ideas of Reference:None  Suicidal Thoughts:Suicidal Thoughts: No  Homicidal Thoughts:Homicidal Thoughts: No   Sensorium  Memory:Immediate Good; Recent Good  Judgment:Fair  Insight:Fair   Executive Functions  Concentration:Fair  Attention Span:Fair  [place]  Language:Poor   Psychomotor Activity  Psychomotor Activity:Psychomotor Activity: Tremor   Assets  Assets:Communication Skills; Housing; Social Support   Sleep    Physical Exam: Physical Exam Vitals reviewed.  HENT:     Head: Normocephalic.  Pulmonary:     Effort: Pulmonary  effort is normal.  Neurological:     General: No focal deficit present.     Mental Status: She is alert.   ROS Blood pressure (!) 108/58, pulse 75, temperature 98.2 F (36.8 C), temperature source Oral, resp. rate 18, height 5' 7"  (1.702 m), weight 77.8 kg, SpO2 98 %. Body mass index is 26.88  kg/m.   Treatment Plan Summary: Assessment:  Given report of husband that she had not been sleeping and had been acting erratically prior to admission, and given her past history of bipolar d/o, this is a presumed exacerbation of her bipolar illness. 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   Diagnoses / Active Problems: Bipolar I MRE manic with psychotic features by hx (r/o schizoaffective d/o bipolar type, r/o psychotic d/o due to general medical condition) UTI Hypokalemia  PLAN: Safety and Monitoring:  -- Voluntary admission to inpatient psychiatric unit for safety, stabilization and treatment  -- Daily contact with patient to assess and evaluate symptoms and progress in treatment  -- Patient's case to be discussed in multi-disciplinary team meeting  -- Observation Level : q15 minute checks  -- Vital signs:  q12 hours  -- Precautions: suicide, elopement, and assault  2. Psychiatric Diagnoses and Treatment:   Bipolar I MRE manic with psychotic features by hx (r/o schizoaffective d/o bipolar type, r/o psychotic d/o due to general medical condition)  -- Stop Abilify due to abrupt decompensation with med change and report of akathisia on medication  -- Restart Zyprexa as 21CC qhs  -- Metabolic profile and EKG monitoring obtained while on an atypical antipsychotic (Lipid panel WNL, A1c 4.9, QTC 447m)  -- Prolactin level pending trending given elevation during last hospitalization  -- Continue Cogentin 0.553mbid with AIMS monitoring  -- Continue  Trazodone 5038mhs PRN insomnia  -- Discussed option of Neuroimaging and additional lab testing for psychosis and patient and she consents to testing Head CT noncontrast, B12, RPR, HIV, ceruloplasmin, heavy metal, ESR, and ANA ordered. (TSH 1.407, alcohol less than 10, CMP within normal limits other than a potassium of 3.0 on admission and a glucose of 113, AST 15 and ALT 15, WBC 6.2, UDS negative)  -- Encouraged patient to participate in unit milieu and in scheduled group therapies       3. Medical Issues Being Addressed:   UTI  -- Continue Macrobid 100m110md for 7 days   -- Preliminary culture results show 60,000 CFU E coli and >100,000 CFU streptococcus agalactiae   Hypokalemia - resolved  - Patient given K+ replacement and repeat BMP shows K+ 4.1  4. Discharge Planning:   -- Social work and case management to assist with discharge planning and identification of hospital follow-up needs prior to discharge  -- Estimated LOS: 5-7 days  -- Discharge Concerns: Need to establish a safety plan; Medication compliance and effectiveness  -- Discharge Goals: Return home with outpatient referrals for mental health follow-up including medication management/psychotherapy  Mystie Ormand Harlow Asa 03/07/2021, 6:08 PM

## 2021-03-06 NOTE — BHH Group Notes (Signed)
Patient did not attend the relaxation group. 

## 2021-03-06 NOTE — Group Note (Signed)
Recreation Therapy Group Note   Group Topic:Stress Management  Group Date: 03/06/2021 Start Time: 0930 End Time: 0954 Facilitators: Victorino Sparrow, LRT,CTRS Location: [address] Dayroom   Goal Area(s) Addresses:  Patient will actively participate in stress management techniques presented during session.  Patient will successfully identify benefit of practicing stress management post d/c.   Group Description: Guided Imagery. LRT provided education, instruction, and demonstration on practice of visualization via guided imagery. Patient was asked to participate in the technique introduced during session. LRT debriefed including topics of mindfulness, stress management and specific scenarios each patient could use these techniques. Patients were given suggestions of ways to access scripts post d/c and encouraged to explore Youtube and other apps available on smartphones, tablets, and computers.   Affect/Mood: Appropriate   Participation Level: Active   Participation Quality: Independent   Behavior: Appropriate   Speech/Thought Process: Focused   Insight: Good   Judgement: Good   Modes of Intervention: Script, Keeler   Patient Response to Interventions:  Attentive   Education Outcome:  Acknowledges education and In group clarification offered    Clinical Observations/Individualized Feedback: Pt attended and participated in group activity.    Plan: Continue to engage patient in RT group sessions 2-3x/week.   Victorino Sparrow, Glennis Brink 03/06/2021 12:35 PM

## 2021-03-06 NOTE — Group Note (Signed)
Date:  03/06/2021 Time:  10:50 AM  Group Topic/Focus:  Orientation:   The focus of this group is to educate the patient on the purpose and policies of crisis stabilization and provide a format to answer questions about their admission.  The group details unit policies and expectations of patients while admitted.    Participation Level:  Minimal  Participation Quality:  Appropriate  Affect:  Appropriate  Cognitive:  Appropriate  Insight: Appropriate  Engagement in Group:  Engaged  Modes of Intervention:  Discussion  Additional Comments:    Garvin Fila 03/06/2021, 10:50 AM

## 2021-03-07 LAB — BASIC METABOLIC PANEL
Anion gap: 7 (ref 5–15)
BUN: 14 mg/dL (ref 6–20)
CO2: 29 mmol/L (ref 22–32)
Calcium: 8.9 mg/dL (ref 8.9–10.3)
Chloride: 104 mmol/L (ref 98–111)
Creatinine, Ser: 0.88 mg/dL (ref 0.44–1.00)
GFR, Estimated: >60 mL/min (ref >60)
Glucose, Bld: 94 mg/dL (ref 70–99)
Potassium: 4.1 mmol/L (ref 3.5–5.1)
Sodium: 140 mmol/L (ref 135–145)

## 2021-03-07 MED ORDER — OLANZAPINE 10 MG PO TABS
10.0000 mg | ORAL_TABLET | Freq: Every day | ORAL | Status: AC
  Administered 2021-03-07: 10 mg via ORAL
  Filled 2021-03-07 (×2): qty 1

## 2021-03-07 NOTE — BHH Suicide Risk Assessment (Signed)
[place] INPATIENT:  Family/Significant Other Suicide Prevention Education  Suicide Prevention Education:  Contact Attempts: Jahliyah Trice, [phone] (name of family member/significant other) has been identified by the patient as the family member/significant other with whom the patient will be residing, and identified as the person(s) who will aid the patient in the event of a mental health crisis.  With written consent from the patient, two attempts were made to provide suicide prevention education, prior to and/or following the patient's discharge.  We were unsuccessful in providing suicide prevention education.  A suicide education pamphlet was given to the patient to share with family/significant other.  Date and time of first attempt:03/06/21 @ 2:30pm Date and time of second attempt:03/07/2021 @ 10:15am.   Rona Tomson E Omere Marti 03/07/2021, 10:22 AM

## 2021-03-07 NOTE — Progress Notes (Signed)
°   03/07/21 1300  Psych Admission Type (Psych Patients Only)  Admission Status Voluntary  Psychosocial Assessment  Patient Complaints Depression  Eye Contact Staring  Facial Expression Wide-eyed  Affect Flat  Speech Soft  Interaction Guarded  Motor Activity Tremors  Appearance/Hygiene Unremarkable  Behavior Characteristics Cooperative  Mood Depressed  Thought Process  Coherency Blocking  Content Paranoia  Delusions Paranoid  Perception Hallucinations  Hallucination Auditory  Judgment Poor  Confusion None  Danger to Self  Current suicidal ideation? Denies  Danger to Others  Danger to Others None reported or observed

## 2021-03-07 NOTE — Progress Notes (Signed)
Pt did not attend group. 

## 2021-03-07 NOTE — BHH Counselor (Signed)
Adult Comprehensive Assessment  Patient ID: MCKENZE SLONE, female   DOB: 11-Apr-1975, 46 y.o.   MRN: [id number]  Information Source: Information source: Patient  Current Stressors:  Patient states their primary concerns and needs for treatment are:: Patient states that her husband was worried about hurting myself. Patient states their goals for this hospitilization and ongoing recovery are:: patient states that she would like for her relationships to be better Educational / Learning stressors: no stressors Employment / Job issues: patient states that she works at [employer] for the past 16 years, no stressors Family Relationships: conflicted with husband -, patient states, "I don't know" when asked to describe conflict Financial / Lack of resources (include bankruptcy): no stressors Housing / Lack of housing: patietn reports that she lives in home with 4 minor children Physical health (include injuries & life threatening diseases): patient reports no stressors Social relationships: pt reports no stressors Substance abuse: pt denies all substance use Bereavement / Loss: pt reports no stressors  Living/Environment/Situation:  Living Arrangements: Spouse/significant other Living conditions (as described by patient or guardian): own home Who else lives in the home?: husband, mother and father, 4 minor children How long has patient lived in current situation?: 6 years What is atmosphere in current home: Supportive, Comfortable  Family History:  Marital status: Married Number of Years Married: 6 What types of issues is patient dealing with in the relationship?: Ongoing communication issues Additional relationship information: had nothing to report Are you sexually active?: Yes What is your sexual orientation?: Heterosexual Has your sexual activity been affected by drugs, alcohol, medication, or emotional stress?: No Does patient have children?: Yes How many children?: 4 How is  patient's relationship with their children?: "We have both good and bad days"  Childhood History:  By whom was/is the patient raised?: Both parents Additional childhood history information: Parents stayed together. Pt unable to describe childhood Description of patient's relationship with caregiver when they were a child: "We all got along fine" How were you disciplined when you got in trouble as a child/adolescent?: Groundings and spankings Did patient suffer any verbal/emotional/physical/sexual abuse as a child?: No Did patient suffer from severe childhood neglect?: No Has patient ever been sexually abused/assaulted/raped as an adolescent or adult?: No Was the patient ever a victim of a crime or a disaster?: No Witnessed domestic violence?: No Has patient been affected by domestic violence as an adult?: No  Education:  Highest grade of school patient has completed: 12th Currently a student?: No Learning disability?: No  Employment/Work Situation:   Employment Situation: Employed Where is Patient Currently Employed?: [employer] How Long has Patient Been Employed?: 16 years Are You Satisfied With Your Job?: Yes Do You Work More Than One Job?: Yes Work Stressors: None Patient's Job has Been Impacted by Current Illness: No Describe how Patient's Job has Been Impacted: N/A What is the Longest Time Patient has Held a Job?: 16 years Where was the Patient Employed at that Time?: [employer] Has Patient ever Been in the [employer]?: No  Financial Resources:   Financial resources: Income from employment, Income from spouse, Private insurance Does patient have a representative payee or guardian?: No  Alcohol/Substance Abuse:   What has been your use of drugs/alcohol within the last 12 months?: pt denies all substance use If attempted suicide, did drugs/alcohol play a role in this?: No Alcohol/Substance Abuse Treatment Hx: Denies past history If yes, describe treatment:  n/a Has alcohol/substance abuse ever caused legal problems?: No  Social Support System:   Patient's Community Support System: Psychologist, prison and probation services Support System: Family and friends Type of faith/religion: sprirttual How does patient's faith help to cope with current illness?: prayer and church  Leisure/Recreation:   Do You Have Hobbies?: Yes Leisure and Hobbies: anything being outdoors  Strengths/Needs:   What is the patient's perception of their strengths?: being social and a part of the community Patient states they can use these personal strengths during their treatment to contribute to their recovery: "I can be a part of something more than myself" Patient states these barriers may affect/interfere with their treatment: none Patient states these barriers may affect their return to the community: none Other important information patient would like considered in planning for their treatment: none  Discharge Plan:   Currently receiving community mental health services: Yes (From Whom) Patient states concerns and preferences for aftercare planning are: [facility] Patient states they will know when they are safe and ready for discharge when: when they can communicate better in relationships Does patient have access to transportation?: Yes Does patient have financial barriers related to discharge medications?: No Will patient be returning to same living situation after discharge?: Yes  Summary/Recommendations:   Summary and Recommendations (to be completed by the evaluator): Karlyn is a 46 year old female who was admitted to [facility] due to paranoia, feeling unsafe and auditory/visual hallucinations.  Patient was very limited with her answers dueinf the assessment.  Patient states that she would like to get stabilized on medications.  She feels like she could improve her relationship with husband.  Patient states that she has four children and friends that she can talk to.  Patient  denies all substance use.  Patient is connected to [facility] for outpatient med management.  While here, Pt can benefit from crisis stabilization, medication evaluation, group therapy, psycho-education, case management and discharge planning. Upon discharge, the patient would like to return home with her husband and children and follow up with a local outpatient provider for therapy and medication management.  Elvira Langston E Kenyatte Chatmon. 03/07/2021

## 2021-03-07 NOTE — Progress Notes (Signed)
CSW called patient husband, Erminia Mcnew, to get additional collateral for the 2nd attempt.  Phone went straight to voicemail and message indicated that mailbox was full and could not leave a message.  CSW unable to leave a voicemail for a call back.    Tashan Kreitzer, LCSW, [place] Social Worker  [facility]

## 2021-03-08 ENCOUNTER — Inpatient Hospital Stay (HOSPITAL_COMMUNITY)
Admit: 2021-03-08 | Discharge: 2021-03-08 | Disposition: A | Discharge status: Home / Self Care | Payer: 59 | Attending: Emergency Medicine | Admitting: Emergency Medicine

## 2021-03-08 LAB — SEDIMENTATION RATE: Sed Rate: 1 mm/hr (ref 0–22)

## 2021-03-08 LAB — VITAMIN B12: Vitamin B-12: 264 pg/mL (ref 180–914)

## 2021-03-08 LAB — HIV ANTIBODY (ROUTINE TESTING W REFLEX): HIV Screen 4th Generation wRfx: NONREACTIVE

## 2021-03-08 LAB — RPR: RPR Ser Ql: NONREACTIVE

## 2021-03-08 LAB — PROLACTIN: Prolactin: 23.6 ng/mL — ABNORMAL HIGH (ref 4.8–23.3)

## 2021-03-08 MED ORDER — DOCUSATE SODIUM 100 MG PO CAPS
100.0000 mg | ORAL_CAPSULE | Freq: Every day | ORAL | Status: DC
  Administered 2021-03-08 – 2021-03-10 (×3): 100 mg via ORAL
  Filled 2021-03-08 (×7): qty 1

## 2021-03-08 MED ORDER — OLANZAPINE 10 MG PO TABS
10.0000 mg | ORAL_TABLET | Freq: Every day | ORAL | Status: DC
  Administered 2021-03-08 – 2021-03-10 (×3): 10 mg via ORAL
  Filled 2021-03-08 (×5): qty 1

## 2021-03-08 NOTE — Progress Notes (Signed)
Patient spent most of the shift resting in bed thus far, upon being engaged in conversation she kept asking about her brother being here. Her behavior was somewhat bizarre, although she was medication compliant. She appears to be in bed resting quietly at this time.

## 2021-03-08 NOTE — Group Note (Signed)
Recreation Therapy Group Note   Group Topic:Stress Management  Group Date: 03/08/2021 Start Time: 0930 End Time: 0950 Facilitators: Victorino Sparrow, [place] Location: [address]   Goal Area(s) Addresses:  Patient will identify positive stress management techniques. Patient will identify benefits of using stress management post d/c.   Group Description:  Meditation.  LRT played a meditation that focused on letting go of the past.  Patients were listen to the meditation while focusing on their breathing and allowing the breathing to relax them as much as possible.  After the meditation, LRT explained to patients about accessing stress management tools through Apps, Youtube, yoga, etc.    Affect/Mood: N/A   Participation Level: Did not attend    Clinical Observations/Individualized Feedback:     Plan: Continue to engage patient in RT group sessions 2-3x/week.   Victorino Sparrow, LRT,CTRS  03/08/2021 12:02 PM

## 2021-03-08 NOTE — Group Note (Signed)
LCSW Group Therapy Note   Group Date: 03/08/2021 Start Time: 1300 End Time: 1400   Type of Therapy and Topic:  Group Therapy:  Recognizing Triggers  Participation Level:  Minimal   Description of Group:   Recognizing Triggers: Patients defined triggers and discussed the importance of recognizing their personal warning signs. Patients identified their own triggers and how they tend to cope with stressful situations. Patients discussed areas such as people, places, things, and thoughts that rigger certain emotions for them. CSW provided support to patients and discussed safety planning for when these triggers occur. Group participants had opportunities to share openly with the group and participate in a group discussion while providing support and feedback to their peers.  Therapeutic Goals: Patient will identify triggers that are contributing to a problem in their life Patient will identify unwanted behaviors and feelings associated with a trigger.  Patient will share with other group members strategies to confront and avoid triggers so that they may be able to react appropriately to triggers in daily life.    Summary of Patient Progress:   The Pt came to group and remained there the entire time.  The Pt accepted the worksheets that were provided and followed along during the group.  The Pt participated in the introduction question and the group discussion.  They showed understanding of the topic being discussed and respect for their peers.     Therapeutic Modalities:   Cognitive Behavioral Therapy Solution Focused Therapy Motivational [place], [place] 03/08/2021  1:51 PM

## 2021-03-08 NOTE — BHH Counselor (Signed)
CSW attempted to call husband and went straight to voicemail.  CSW unable to leave voicemail.     Amberlee Garvey, LCSW, [place] Social Worker  [facility]

## 2021-03-08 NOTE — Progress Notes (Signed)
Patient did attend the evening speaker AA meeting.  

## 2021-03-08 NOTE — Plan of Care (Signed)
I called the patient's husband and updated him on her present clinical presentation. I made him aware of her negative labs and Head CT to date. I made him aware of attempts to transition to Abilify unsuccessfully and need to resume Zyprexa. He is worried that Zyprexa was not effective after her last hospitalization, but I advised that there is concern if she was actually compliant with med after discharge.  He again questions the potential of a long-acting injectable for help with compliance and I explained that we will inquire with pharmacy about options given her insurance but that these injections can be expensive.  I discussed that the hope is we can get her stabilized on Zyprexa and then speak to her when she is able to engage in a more meaningful discussion about potential transition to a different atypical antipsychotic that has LAI potential or use of Depakote or lithium as augmentation.  Supportive therapy provided over the phone for her husband.  Viann Fish, MD, Alda Ponder

## 2021-03-08 NOTE — BHH Group Notes (Signed)
Patient did not attend morning orientation group.  

## 2021-03-08 NOTE — Progress Notes (Signed)
Ordered on the unit [facility] MD Progress Note  03/08/2021 1:25 PM Janet Welch  MRN:  [id number]  Chief Complaint: paranoia and psychosis  Janet Welch is a 46 y.o. female with a history of bipolar d/o, who was initially admitted for inpatient psychiatric hospitalization on 03/04/2021 for management of paranoia and psychosis. The patient is currently on Hospital Day 4.   Chart Review from last 24 hours:  The patient's chart was reviewed and nursing notes were reviewed. The patient's case was discussed in multidisciplinary team meeting.  Per nursing, the patient not attend groups and was guarded on the unit.  She was noted to be resting in bed and during conversation was repeatedly asking about her brother being here and appeared somewhat bizarre.  She had no acute behavioral issues noted.  Per [facility] she was compliant with scheduled medications and did not require any other PRNs for agitation or anxiety.  She did receive trazodone x1 for sleep.  Information Obtained Today During Patient Interview: The patient was seen and evaluated on the unit.  She states that she is feeling sedated today and "heavy" in her body.  We discussed that some of this may be related to the fact that she was on 2 antipsychotics for the past 2 days given our attempt to try to transition her onto Abilify as a new medication.  Now that the Abilify has been discontinued she states she is not having the urge to move and we discussed that she will just be on Zyprexa at bedtime.  Due to oversedation complaints, we will discontinue the as needed trazodone and she was encouraged to be out of bed and work on sleep hygiene.  She admits that she still has some residual paranoia but denies AVH, first rank symptoms or ideas of reference.  On exam she appears to have a guarded affect and occasional thought blocking.  When asked about precipitant events that led to this admission she cannot answer and derails into random comment about how she likes  this examiner's tennis shoes.  She denies SI or HI.  She states she felt a little lightheaded today and was encouraged to drink more fluids. She denies chest pain, palpitations, shortness of breath or syncope.  She was encouraged to attend groups as well as to attend to her hygiene.  She reports good appetite but does report some intermittent constipation.  I made her aware of the fact that we are treating her for presumed UTI but she denies any current dysuria, urinary frequency urgency or hematuria.  Principal Problem: Bipolar affective disorder, current episode manic with psychotic symptoms ([place]) Diagnosis: Principal Problem:   Bipolar affective disorder, current episode manic with psychotic symptoms ([place])  Total Time Spent in Direct Patient Care:  I personally spent 25 minutes on the unit in direct patient care. The direct patient care time included face-to-face time with the patient, reviewing the patient's chart, communicating with other professionals, and coordinating care. Greater than 50% of this time was spent in counseling or coordinating care with the patient regarding goals of hospitalization, psycho-education, and discharge planning needs.  Past Psychiatric History: see H&P  Past Medical History:  Past Medical History:  Diagnosis Date   Bipolar 1 disorder ([place])    No pertinent past medical history     Past Surgical History:  Procedure Laterality Date   CESAREAN SECTION N/A 07/07/2013   Procedure: CESAREAN SECTION;  Surgeon: Linda Hedges, DO;  Location: [facility];  Service: Obstetrics;  Laterality:  N/A;   LEG SKIN LESION  BIOPSY / EXCISION     scar tissue biopsy     Family History:  Family History  Problem Relation Age of Onset   Thyroid disease Mother    Mental illness Mother    Parkinson's disease Father    Stroke Maternal Grandmother    Stroke Maternal Grandfather    Family Psychiatric  History: see H&P  Social History:  Social History   Substance and Sexual Activity   Alcohol Use Yes   Alcohol/week: 0.0 - 1.0 standard drinks   Comment: socially     Social History   Substance and Sexual Activity  Drug Use Not Currently   Types: Marijuana    Social History   Socioeconomic History   Marital status: Married    Spouse name: Not on file   Number of children: Not on file   Years of education: Not on file   Highest education level: Not on file  Occupational History    Comment: [employer]  Tobacco Use   Smoking status: Never   Smokeless tobacco: Never  Vaping Use   Vaping Use: Never used  Substance and Sexual Activity   Alcohol use: Yes    Alcohol/week: 0.0 - 1.0 standard drinks    Comment: socially   Drug use: Not Currently    Types: Marijuana   Sexual activity: Yes    Birth control/protection: None  Other Topics Concern   Not on file  Social History Narrative   Not on file   Social Determinants of Health   Financial Resource Strain: Not on file  Food Insecurity: Not on file  Transportation Needs: Not on file  Physical Activity: Not on file  Stress: Not on file  Social Connections: Not on file   Additional Social History:    Pain Medications: See MAR Prescriptions: See MAR Over the Counter: See MAR History of alcohol / drug use?: No history of alcohol / drug abuse Longest period of sobriety (when/how long): NA  Sleep: Good  Appetite:  Good  Current Medications: Current Facility-Administered Medications  Medication Dose Route Frequency Provider Last Rate Last Admin   acetaminophen (TYLENOL) tablet 650 mg  650 mg Oral Q6H PRN Ethelene Hal, NP       alum & mag hydroxide-simeth (MAALOX/MYLANTA) 200-200-20 MG/5ML suspension 30 mL  30 mL Oral Q4H PRN Ethelene Hal, NP       benztropine (COGENTIN) tablet 0.5 mg  0.5 mg Oral BID Leonte Horrigan E, MD   0.5 mg at 03/08/21 3846   docusate sodium (COLACE) capsule 100 mg  100 mg Oral Daily Nelda Marseille, Stiles Maxcy E, MD       hydrOXYzine (ATARAX) tablet 25 mg  25 mg Oral TID  PRN Ethelene Hal, NP   25 mg at 03/05/21 2110   OLANZapine zydis (ZYPREXA) disintegrating tablet 5 mg  5 mg Oral Q8H PRN Ethelene Hal, NP   5 mg at 03/06/21 6599   And   LORazepam (ATIVAN) tablet 1 mg  1 mg Oral PRN Ethelene Hal, NP       And   ziprasidone (GEODON) injection 20 mg  20 mg Intramuscular PRN Ethelene Hal, NP       magnesium hydroxide (MILK OF MAGNESIA) suspension 30 mL  30 mL Oral Daily PRN Ethelene Hal, NP       nitrofurantoin (macrocrystal-monohydrate) (MACROBID) capsule 100 mg  100 mg Oral Q12H Nkwenti, Doris, NP   100 mg at 03/08/21  0175   traZODone (DESYREL) tablet 50 mg  50 mg Oral QHS PRN Ethelene Hal, NP   50 mg at 03/07/21 2127    Lab Results:  Results for orders placed or performed during the hospital encounter of 03/04/21 (from the past 48 hour(s))  Basic metabolic panel     Status: None   Collection Time: 03/07/21  6:10 AM  Result Value Ref Range   Sodium 140 135 - 145 mmol/L   Potassium 4.1 3.5 - 5.1 mmol/L    Comment: DELTA CHECK NOTED NO VISIBLE HEMOLYSIS    Chloride 104 98 - 111 mmol/L   CO2 29 22 - 32 mmol/L   Glucose, Bld 94 70 - 99 mg/dL    Comment: Glucose reference range applies only to samples taken after fasting for at least 8 hours.   BUN 14 6 - 20 mg/dL   Creatinine, Ser 0.88 0.44 - 1.00 mg/dL   Calcium 8.9 8.9 - 10.3 mg/dL   GFR, Estimated >60 >60 mL/min    Comment: (NOTE) Calculated using the CKD-EPI Creatinine Equation (2021)    Anion gap 7 5 - 15    Comment: Performed at [facility], [place] [address] [place], [place] [address]  Prolactin     Status: Abnormal   Collection Time: 03/07/21  6:10 AM  Result Value Ref Range   Prolactin 23.6 (H) 4.8 - 23.3 ng/mL    Comment: (NOTE) Performed At: [facility] [place], [place] [id number] Rush Farmer MD [id number]   Sedimentation rate     Status: None   Collection Time: 03/08/21  6:20 AM   Result Value Ref Range   Sed Rate 1 0 - 22 mm/hr    Comment: Performed at [facility], [place] [address] [place], [place] [address]  Vitamin B12     Status: None   Collection Time: 03/08/21  6:20 AM  Result Value Ref Range   Vitamin B-12 264 180 - 914 pg/mL    Comment: (NOTE) This assay is not validated for testing neonatal or myeloproliferative syndrome specimens for Vitamin B12 levels. Performed at [facility], [place] [address], [place], [place] [address]   HIV Antibody (routine testing w rflx)     Status: None   Collection Time: 03/08/21  6:20 AM  Result Value Ref Range   HIV Screen 4th Generation wRfx Non Reactive Non Reactive    Comment: Performed at [facility], [place] [address] [place], [place] [address]    Blood Alcohol level:  Lab Results  Component Value Date   ETH <10 03/05/2021   ETH <10 45/80/9983    Metabolic Disorder Labs: Lab Results  Component Value Date   HGBA1C 4.9 03/05/2021   MPG 93.93 03/05/2021   MPG 99.67 11/22/2020   Lab Results  Component Value Date   PROLACTIN 23.6 (H) 03/07/2021   PROLACTIN 27.6 (H) 11/27/2020   Lab Results  Component Value Date   CHOL 160 03/05/2021   TRIG 54 03/05/2021   HDL 61 03/05/2021   CHOLHDL 2.6 03/05/2021   VLDL 11 03/05/2021   LDLCALC 88 03/05/2021   LDLCALC 79 11/27/2020    Physical Findings: AIMS: Facial and Oral Movements Muscles of Facial Expression: None, normal Lips and Perioral Area: None, normal Jaw: None, normal Tongue: None, normal,Extremity Movements Upper (arms, wrists, hands, fingers): None, normal Lower (legs, knees, ankles, toes): None, normal, Trunk Movements Neck, shoulders, hips: None, normal, Overall Severity Severity of abnormal  movements (highest score from questions above): None, normal Incapacitation due to abnormal movements: None, normal Patient's awareness of abnormal movements (rate only patient's report): No Awareness, Dental  Status Current problems with teeth and/or dentures?: No Does patient usually wear dentures?: No   Musculoskeletal: Strength & Muscle Tone: within normal limits Gait & Station: normal, steady Patient leans: N/A  Psychiatric Specialty Exam:  Presentation  General Appearance: Appropriate for Environment; Fairly Groomed  Eye Contact:Good but staring quality  Speech:Nonspontaneous - speaks in short phrases to direct questions with occasional hesitancy of speech  Speech Volume:Normal  Mood and Affect  Mood:suspicious, aloof  Affect:guarded, constricted   Thought Process  Thought Processes:Linear for most of interview but derails at times and generally vague and evasive in her answers   Orientation:oriented to self, month, year and city but not situation  Thought Content: Appears paranoid and guarded on exam with intermittent thought blocking and some paucity of thought.  She denies AVH, ideas of reference or first rank symptoms.  She does not make delusional statements on exam.  History of Schizophrenia/Schizoaffective disorder:No  Duration of Psychotic Symptoms:Greater than six months  Hallucinations:Denied but has thought blocking suggestive of some internal preoccupation  Ideas of Reference:None  Suicidal Thoughts:Denied  Homicidal Thoughts:Denied   Sensorium  Memory:Poor  Judgment:Fair  Insight:Shallow   Executive Functions  Concentration:Poor  Attention Span:Poor  [place]  Language:Poor   Psychomotor Activity  Psychomotor Activity:No cogwheeling, stiffness, tremors or akathisias noted - specifically no tongue tremors   Assets  Assets:Communication Skills; Housing; Social Support   Sleep  Total time unrecorded  Physical Exam Vitals reviewed.  HENT:     Head: Normocephalic.  Pulmonary:     Effort: Pulmonary effort is normal.  Neurological:     General: No focal deficit present.     Mental Status: She is  alert.   Review of Systems  Respiratory:  Negative for shortness of breath.   Cardiovascular:  Negative for chest pain and palpitations.  Gastrointestinal:  Positive for constipation. Negative for abdominal pain, diarrhea, nausea and vomiting.  Neurological:  Positive for dizziness. Negative for focal weakness, loss of consciousness and headaches.  Blood pressure 112/72, pulse 99, temperature 97.9 F (36.6 C), temperature source Oral, resp. rate 18, height $RemoveBe'5\' 7"'EIkXvkiBA$  (1.702 m), weight 77.8 kg, SpO2 97 %. Body mass index is 26.88 kg/m.  Treatment Plan Summary: Assessment:  Given report of husband that she had not been sleeping and had been acting erratically prior to admission, and given her past history of bipolar d/o, this is a presumed exacerbation of her bipolar illness. 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   Diagnoses / Active Problems: Bipolar I MRE manic with psychotic features by hx (r/o schizoaffective d/o bipolar type, r/o psychotic d/o due to general medical condition) UTI Hypokalemia  PLAN: Safety and Monitoring:  -- Voluntary admission to inpatient psychiatric unit for safety, stabilization and treatment  -- Daily contact with patient to assess and evaluate symptoms and progress in treatment  -- Patient's case to be discussed in multi-disciplinary team meeting  -- Observation Level : q15 minute checks  -- Vital signs:  q12 hours  -- Precautions: suicide, elopement, and assault  2. Psychiatric Diagnoses and Treatment:   Bipolar I MRE manic with psychotic features by hx (r/o schizoaffective d/o bipolar type, r/o psychotic d/o due to general medical condition)  --  Stopped Abilify due to abrupt decompensation with med change and report of akathisia on medication  -- Continue Zyprexa 44CX qhs  -- Metabolic profile and EKG monitoring  obtained while on an atypical antipsychotic (Lipid panel WNL, A1c 4.9, QTC 475m)  -- Prolactin level 23.6 (was 27.6 3 months ago and 31.8 3 years ago) - will need f/u with PCP after discharge  -- Continue Cogentin 0.559mbid with AIMS monitoring  -- Due to oversedation, will d/c Trazodone PRN  -- Psychosis w/u: Head CT noncontrast negative;  ESR 1,  B12 264, HIV nonreactive, TSH 1.407, alcohol less than 10, CMP within normal limits other than a potassium of 3.0 on admission and a glucose of 113, AST 15 and ALT 15, WBC 6.2, UDS negative (RPR, ceruloplasmin, heavy metal, and ANA pending)  -- Encouraged patient to participate in unit milieu and in scheduled group therapies       3. Medical Issues Being Addressed:   UTI  -- Continue Macrobid 10065mid for 7 days pending susceptibility report  -- Preliminary culture results show 60,000 CFU E coli and >100,000 CFU streptococcus agalactiae   Hypokalemia - resolved  - Patient given K+ replacement and repeat BMP shows K+ 4.1   Constipation  -- start colace 100m57mily and push po fluids   Dizziness  -- Checking orthostatic vitals and pushing po fluids  4. Discharge Planning:   -- Social work and case management to assist with discharge planning and identification of hospital follow-up needs prior to discharge  -- Discharge Concerns: Need to establish a safety plan; Medication compliance and effectiveness  -- Discharge Goals: Return home with outpatient referrals for mental health follow-up including medication management/psychotherapy  Colburn Asper Harlow Asa, FAPA 03/08/2021, 1:25 PM

## 2021-03-08 NOTE — Progress Notes (Signed)
°   03/08/21 1400  Psych Admission Type (Psych Patients Only)  Admission Status Voluntary  Psychosocial Assessment  Patient Complaints Depression  Eye Contact Staring  Facial Expression Wide-eyed  Affect Flat  Speech Soft  Interaction Guarded  Motor Activity Tremors  Appearance/Hygiene Unremarkable  Behavior Characteristics Cooperative  Mood Depressed  Thought Process  Coherency Blocking  Content Paranoia  Delusions Paranoid  Perception Hallucinations  Hallucination Auditory  Judgment Poor  Confusion None  Danger to Self  Current suicidal ideation? Denies  Danger to Others  Danger to Others None reported or observed

## 2021-03-09 LAB — URINE CULTURE: Culture: >=100000 — AB

## 2021-03-09 LAB — CERULOPLASMIN: Ceruloplasmin: 16.2 mg/dL — ABNORMAL LOW (ref 19.0–39.0)

## 2021-03-09 LAB — ANA: Anti Nuclear Antibody (ANA): NEGATIVE

## 2021-03-09 NOTE — Progress Notes (Signed)
°   03/08/21 1400  Psych Admission Type (Psych Patients Only)  Admission Status Voluntary  Psychosocial Assessment  Patient Complaints Depression  Eye Contact Staring  Facial Expression Wide-eyed  Affect Flat  Speech Soft  Interaction Guarded  Motor Activity Tremors  Appearance/Hygiene Unremarkable  Behavior Characteristics Cooperative  Mood Depressed  Thought Process  Coherency Blocking  Content Paranoia  Delusions Paranoid  Perception Hallucinations  Hallucination Auditory  Judgment Poor  Confusion None  Danger to Self  Current suicidal ideation? Denies  Danger to Others  Danger to Others None reported or observed

## 2021-03-09 NOTE — BHH Group Notes (Signed)
Goals Group 03/09/2021   Group Focus: affirmation, clarity of thought, and goals/reality orientation Treatment Modality:  Psychoeducation Interventions utilized were assignment, group exercise, and support Purpose: To be able to understand and verbalize the reason for their admission to the hospital. To understand that the medication helps with their chemical imbalance but they also need to work on their choices in life. To be challenged to develop a list of 30 positives about themselves. Also introduce the concept that "feelings" are not reality.  Participation Level:  Active  Participation Quality:  Appropriate  Affect:  Appropriate  Cognitive:  Appropriate  Insight:  Improving  Engagement in Group:  Engaged  Additional Comments:  Pt came in and gave her name but said she was just here to observe and listen. Listended but did not participate  [name]

## 2021-03-09 NOTE — Progress Notes (Signed)
°   03/09/21 1000  Psych Admission Type (Psych Patients Only)  Admission Status Voluntary  Psychosocial Assessment  Patient Complaints Isolation  Eye Contact Staring  Facial Expression Flat  Affect Preoccupied  Speech Soft  Interaction Minimal  Motor Activity Slow  Appearance/Hygiene Unremarkable  Behavior Characteristics Cooperative  Mood Depressed  Thought Process  Coherency Blocking  Content Paranoia  Delusions Paranoid  Perception Hallucinations  Hallucination Auditory  Judgment Poor  Confusion None  Danger to Self  Current suicidal ideation? Denies  Danger to Others  Danger to Others None reported or observed

## 2021-03-09 NOTE — BHH Group Notes (Signed)
Psychoeducational Group Note    Date:03/09/21 Time: 1300-1400    Purpose of Group: . The group focus' on teaching patients on how to identify their needs and their Life Skills:  A group where two lists are made. What people need and what are things that we do that are unhealthy. The lists are developed by the patients and it is explained that we often do the actions that are not healthy to get our list of needs met.  Goal:: to develop the coping skills needed to get their needs met  Participation Level:  Active  Participation Quality:  Appropriate  Affect:  Appropriate  Cognitive:  Oriented  Insight:  Improving  Engagement in Group:  Engaged  Additional Comments:  Rates energy as a 5/10. Participated fullyi ing the group.   Janet Welch

## 2021-03-09 NOTE — Progress Notes (Signed)
Ordered on the unit [facility] MD Progress Note  03/09/2021 1:26 PM Janet Welch  MRN:  [id number]  Chief Complaint: paranoia and psychosis  Subjective: Janet Welch reports: "I'm just a little tired."  Daily Notes, 03/09/2021: Pt's chart reviewed, her case discussed with her treatment team. Pt with flat affect and depressed mood, attention to personal hygiene and grooming is fair, eye contact is good, speech is clear & coherent, but pt presents with some speech latency, and responses are slightly delayed. Thought contents are organized and logical. Pt currently denies SI/HI, reports auditory hallucinations, unable to tell specifically what she is seeing, and unable to state the last time that she saw something. Pt reports feelings of paranoia, states that she feels as though people are out to get her. She reports also feeling like people can read her mind.  Pt reports her, sleep quality last night as fair, and reports appetite as fair. Nursing staff reports that pt is eating all of her meals. Fluids are being encouraged. She rates her depression today as 5 (10 being worst), and rates her anxiety level as 5 (10 being worst). Pt has been visible in the day room today attending group sessions, but has limited interactions with her peers.   HR slightly elevated at 111, rest of V/S are WNL. Will continue to monitor HR. Pt denies being in any physical discomfort. Pt denies any side effects to current medications. Tongue tremors have resolved with the cessation of Abilify and the addition of Cogentin. Will continue Zyprexa 10 mg nightly for psychosis.   HPI: Janet Welch is a 46 y.o. female with a history of bipolar d/o, who was initially admitted for inpatient psychiatric hospitalization on 03/04/2021 for management of paranoia and psychosis.   Principal Problem: Bipolar affective disorder, current episode manic with psychotic symptoms ([place]) Diagnosis: Principal Problem:   Bipolar affective disorder, current episode  manic with psychotic symptoms ([place])  Past Psychiatric History: see H&P  Past Medical History:  Past Medical History:  Diagnosis Date   Bipolar 1 disorder ([place])    No pertinent past medical history     Past Surgical History:  Procedure Laterality Date   CESAREAN SECTION N/A 07/07/2013   Procedure: CESAREAN SECTION;  Surgeon: Linda Hedges, DO;  Location: [facility];  Service: Obstetrics;  Laterality: N/A;   LEG SKIN LESION  BIOPSY / EXCISION     scar tissue biopsy     Family History:  Family History  Problem Relation Age of Onset   Thyroid disease Mother    Mental illness Mother    Parkinson's disease Father    Stroke Maternal Grandmother    Stroke Maternal Grandfather    Family Psychiatric  History: see H&P  Social History:  Social History   Substance and Sexual Activity  Alcohol Use Yes   Alcohol/week: 0.0 - 1.0 standard drinks   Comment: socially     Social History   Substance and Sexual Activity  Drug Use Not Currently   Types: Marijuana    Social History   Socioeconomic History   Marital status: Married    Spouse name: Not on file   Number of children: Not on file   Years of education: Not on file   Highest education level: Not on file  Occupational History    Comment: [employer]  Tobacco Use   Smoking status: Never   Smokeless tobacco: Never  Vaping Use   Vaping Use: Never used  Substance and Sexual Activity   Alcohol use:  Yes    Alcohol/week: 0.0 - 1.0 standard drinks    Comment: socially   Drug use: Not Currently    Types: Marijuana   Sexual activity: Yes    Birth control/protection: None  Other Topics Concern   Not on file  Social History Narrative   Not on file   Social Determinants of Health   Financial Resource Strain: Not on file  Food Insecurity: Not on file  Transportation Needs: Not on file  Physical Activity: Not on file  Stress: Not on file  Social Connections: Not on file   Additional Social History:    Pain Medications: See  MAR Prescriptions: See MAR Over the Counter: See MAR History of alcohol / drug use?: No history of alcohol / drug abuse Longest period of sobriety (when/how long): NA  Sleep: Good  Appetite:  Good  Current Medications: Current Facility-Administered Medications  Medication Dose Route Frequency Provider Last Rate Last Admin   acetaminophen (TYLENOL) tablet 650 mg  650 mg Oral Q6H PRN Ethelene Hal, NP       alum & mag hydroxide-simeth (MAALOX/MYLANTA) 200-200-20 MG/5ML suspension 30 mL  30 mL Oral Q4H PRN Ethelene Hal, NP       benztropine (COGENTIN) tablet 0.5 mg  0.5 mg Oral BID Nelda Marseille, Amy E, MD   0.5 mg at 03/09/21 2426   docusate sodium (COLACE) capsule 100 mg  100 mg Oral Daily Nelda Marseille, Amy E, MD   100 mg at 03/09/21 8341   hydrOXYzine (ATARAX) tablet 25 mg  25 mg Oral TID PRN Ethelene Hal, NP   25 mg at 03/05/21 2110   OLANZapine zydis (ZYPREXA) disintegrating tablet 5 mg  5 mg Oral Q8H PRN Ethelene Hal, NP   5 mg at 03/06/21 9622   And   LORazepam (ATIVAN) tablet 1 mg  1 mg Oral PRN Ethelene Hal, NP       And   ziprasidone (GEODON) injection 20 mg  20 mg Intramuscular PRN Ethelene Hal, NP       magnesium hydroxide (MILK OF MAGNESIA) suspension 30 mL  30 mL Oral Daily PRN Ethelene Hal, NP       nitrofurantoin (macrocrystal-monohydrate) (MACROBID) capsule 100 mg  100 mg Oral Q12H Zailyn Thoennes, NP   100 mg at 03/09/21 0832   OLANZapine (ZYPREXA) tablet 10 mg  10 mg Oral QHS Harlow Asa, MD   10 mg at 03/08/21 2106   Lab Results:  Results for orders placed or performed during the hospital encounter of 03/04/21 (from the past 48 hour(s))  Sedimentation rate     Status: None   Collection Time: 03/08/21  6:20 AM  Result Value Ref Range   Sed Rate 1 0 - 22 mm/hr    Comment: Performed at [facility], [place] [address] [place], [place] [address]  Vitamin B12     Status: None   Collection Time:  03/08/21  6:20 AM  Result Value Ref Range   Vitamin B-12 264 180 - 914 pg/mL    Comment: (NOTE) This assay is not validated for testing neonatal or myeloproliferative syndrome specimens for Vitamin B12 levels. Performed at [facility], [place] [address] [place], [place] [address]   Ceruloplasmin     Status: Abnormal   Collection Time: 03/08/21  6:20 AM  Result Value Ref Range   Ceruloplasmin 16.2 (L) 19.0 - 39.0 mg/dL    Comment: (NOTE) Performed At: [facility] [address]  [place], [place] [id number] Rush Farmer MD UP:[id number]   RPR     Status: None   Collection Time: 03/08/21  6:20 AM  Result Value Ref Range   RPR Ser Ql NON REACTIVE NON REACTIVE    Comment: Performed at [place] [address], [place], [place] [address]  HIV Antibody (routine testing w rflx)     Status: None   Collection Time: 03/08/21  6:20 AM  Result Value Ref Range   HIV Screen 4th Generation wRfx Non Reactive Non Reactive    Comment: Performed at [facility], [place] [address], [place], [place] [address]  ANA     Status: None   Collection Time: 03/08/21  6:20 AM  Result Value Ref Range   Anti Nuclear Antibody (ANA) Negative Negative    Comment: (NOTE) Performed At: [facility] [place], [place] [id number] Rush Farmer MD [id number]     Blood Alcohol level:  Lab Results  Component Value Date   [facility] <10 03/05/2021   ETH <10 19/16/6060    Metabolic Disorder Labs: Lab Results  Component Value Date   HGBA1C 4.9 03/05/2021   MPG 93.93 03/05/2021   MPG 99.67 11/22/2020   Lab Results  Component Value Date   PROLACTIN 23.6 (H) 03/07/2021   PROLACTIN 27.6 (H) 11/27/2020   Lab Results  Component Value Date   CHOL 160 03/05/2021   TRIG 54 03/05/2021   HDL 61 03/05/2021   CHOLHDL 2.6 03/05/2021   VLDL 11 03/05/2021   LDLCALC 88 03/05/2021   LDLCALC 79 11/27/2020   Physical Findings: AIMS: Facial and Oral  Movements Muscles of Facial Expression: None, normal Lips and Perioral Area: None, normal Jaw: None, normal Tongue: None, normal,Extremity Movements Upper (arms, wrists, hands, fingers): None, normal Lower (legs, knees, ankles, toes): None, normal, Trunk Movements Neck, shoulders, hips: None, normal, Overall Severity Severity of abnormal movements (highest score from questions above): None, normal Incapacitation due to abnormal movements: None, normal Patient's awareness of abnormal movements (rate only patient's report): No Awareness, Dental Status Current problems with teeth and/or dentures?: No Does patient usually wear dentures?: No   Musculoskeletal: Strength & Muscle Tone: within normal limits Gait & Station: normal, steady Patient leans: N/A  Psychiatric Specialty Exam:  Presentation  General Appearance: Appropriate for Environment; Fairly Groomed  Eye Contact:Good but staring quality  Speech:Nonspontaneous - speaks in short phrases to direct questions with occasional hesitancy of speech  Speech Volume:Decreased  Mood and Affect  Mood:suspicious, aloof  Affect:guarded, constricted  Thought Process  Thought Processes:Linear for most of interview but derails at times and generally vague and evasive in her answers   Orientation:oriented to self, month, year and city but not situation  Thought Content: Appears paranoid and guarded on exam with intermittent thought blocking and some paucity of thought.  She denies AVH, ideas of reference or first rank symptoms.  She does not make delusional statements on exam.  History of Schizophrenia/Schizoaffective disorder:No  Duration of Psychotic Symptoms:Greater than six months  Hallucinations:Denied but has thought blocking suggestive of some internal preoccupation  Ideas of Reference:Paranoia  Suicidal Thoughts:Denied  Homicidal Thoughts:Denied  Sensorium  Memory:Poor  Judgment:Fair  Insight:Shallow  Executive  Functions  Concentration: Fair  Attention Span: [place]  Psychomotor Activity  Psychomotor Activity:No cogwheeling, stiffness, tremors or akathisias noted - specifically no tongue tremors  Assets  Assets:Communication Skills  Sleep  Fair  Physical Exam Vitals reviewed.  Constitutional:      Appearance: Normal appearance.  HENT:     Head: Normocephalic.     Nose: Nose normal. No congestion or rhinorrhea.  Eyes:     Pupils: Pupils are equal, round, and reactive to light.  Pulmonary:     Effort: Pulmonary effort is normal.  Musculoskeletal:        General: Normal range of motion.  Neurological:     General: No focal deficit present.     Mental Status: She is alert.   Review of Systems  Constitutional: Negative.  Negative for fever.  HENT: Negative.    Respiratory: Negative.  Negative for cough and shortness of breath.   Cardiovascular:  Negative for chest pain and palpitations.  Gastrointestinal:  Negative for abdominal pain, constipation, diarrhea, nausea and vomiting.  Neurological:  Negative for dizziness, focal weakness, loss of consciousness and headaches.  Psychiatric/Behavioral:  Positive for depression. Negative for hallucinations, substance abuse and suicidal ideas.   Blood pressure 111/76, pulse (!) 111, temperature 98 F (36.7 C), temperature source Oral, resp. rate 18, height 5\' 7"  (1.702 m), weight 77.8 kg, SpO2 98 %. Body mass index is 26.88 kg/m.  Treatment Plan Summary: Assessment:  Given report of husband that she had not been sleeping and had been acting erratically prior to admission, and given her past history of bipolar d/o, this is a presumed exacerbation of her bipolar illness. 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   Diagnoses / Active  Problems: Bipolar I MRE manic with psychotic features by hx (r/o schizoaffective d/o bipolar type, r/o psychotic d/o due to general medical condition) UTI Hypokalemia  PLAN: Safety and Monitoring:  -- Voluntary admission to inpatient psychiatric unit for safety, stabilization and treatment  -- Daily contact with patient to assess and evaluate symptoms and progress in treatment  -- Patient's case to be discussed in multi-disciplinary team meeting  -- Observation Level : q15 minute checks  -- Vital signs:  q12 hours  -- Precautions: suicide, elopement, and assault  2. Psychiatric Diagnoses and Treatment:   Bipolar I MRE manic with psychotic features by hx (r/o schizoaffective d/o bipolar type, r/o psychotic d/o due to general medical condition)  -- Stopped Abilify due to abrupt decompensation with med change and report of akathisia on medication  -- Continue Zyprexa 10mg  qhs  -- Metabolic profile and EKG monitoring obtained while on an atypical antipsychotic (Lipid panel WNL, A1c 4.9, QTC 421ms)  -- Prolactin level 23.6 (was 27.6 3 months ago and 31.8 3 years ago) - will need f/u with PCP after discharge  -- Continue Cogentin 0.5mg  bid with AIMS monitoring  -- Due to oversedation, will d/c Trazodone PRN  -- Psychosis w/u: Head CT noncontrast negative;  ESR 1,  B12 264, HIV nonreactive, TSH 1.407, alcohol less than 10, CMP within normal limits other than a potassium of 3.0 on admission. Repeat CMP on 2/16 with Potassium at 4.1 (WNL). Glucose of 113, AST 15 and ALT 15, WBC 6.2, UDS negative RPR non reactive, ceruloplasmin low at 16.2, heavy metal pending, and ANA negative.  -- Encouraged patient to participate in unit milieu and in scheduled group therapies      3. Medical Issues Being Addressed:   UTI  -- Continue Macrobid 100mg  bid for 7 days pending susceptibility report  -- Preliminary culture results show 60,000 CFU E coli and >100,000 CFU streptococcus  agalactiae   Hypokalemia -  resolved  - Patient given K+ replacement and repeat BMP shows K+ 4.1   Constipation  -- Continue colace 150m daily and push po fluids   Dizziness  -- Checking orthostatic vitals and pushing po fluids  4. Discharge Planning:   -- Social work and case management to assist with discharge planning and identification of hospital follow-up needs prior to discharge  -- Discharge Concerns: Need to establish a safety plan; Medication compliance and effectiveness  -- Discharge Goals: Return home with outpatient referrals for mental health follow-up including medication management/psychotherapy  DNicholes Rough NP 03/09/2021, 1:26 PM Patient ID: Janet Welch female   DOB: 1June 24, 1977 46y.o.   MRN: [id number]

## 2021-03-09 NOTE — Progress Notes (Signed)
Adult Psychoeducational Group Note  Date:  03/09/2021 Time:  9:00 PM  Group Topic/Focus:  Wrap-Up Group:   The focus of this group is to help patients review their daily goal of treatment and discuss progress on daily workbooks.  Participation Level:  Active  Participation Quality:  Appropriate  Affect:  Appropriate  Cognitive:  Appropriate  Insight: Good  Engagement in Group:  Engaged  Modes of Intervention:  Discussion  Additional Comments:  her day was 5. Her goal attend group  Barbette Hair 03/09/2021, 9:00 PM

## 2021-03-09 NOTE — Group Note (Signed)
LCSW Group Therapy Note  03/09/2021     Type of Therapy and Topic:  Group Therapy: Anger and Coping Skills  Participation Level:  Minimal   Description of Group:   In this group, patients learned how to recognize the physical, cognitive, emotional, and behavioral responses they have to anger-provoking situations.  They identified how they usually or often react when angered, and learned how healthy and unhealthy coping skills work initially, but the unhealthy ones stop working.   They analyzed how their frequently-chosen coping skill is possibly beneficial and how it is possibly unhelpful.  The group discussed a variety of healthier coping skills that could help in resolving the actual issues, as well as how to go about planning for the the possibility of future similar situations.  Therapeutic Goals: Patients will identify one thing that makes them angry and how they feel emotionally and physically, what their thoughts are or tend to be in those situations, and what healthy or unhealthy coping mechanism they typically use Patients will identify how their coping technique works for them, as well as how it works against them. Patients will explore possible new behaviors to use in future anger situations. Patients will learn that anger itself is normal and cannot be eliminated, and that healthier coping skills can assist with resolving conflict rather than worsening situations.  Summary of Patient Progress:  The patient shared that she often is angered by somebody messing with her children and chooses to cope with these feelings by internalizing them or fighting back.  The patient was the last person to share in group but still had to be asked the specific questions everyone was answering although all in front of her had already spoken.  Thus, it seems she was not really paying much attention.  She did not talk further in group.  Therapeutic Modalities:   Cognitive Behavioral Therapy   Janet Welch  .

## 2021-03-10 MED ORDER — POLYETHYLENE GLYCOL 3350 17 G PO PACK
17.0000 g | PACK | Freq: Every day | ORAL | Status: DC
  Administered 2021-03-10 – 2021-03-13 (×4): 17 g via ORAL
  Filled 2021-03-10 (×8): qty 1

## 2021-03-10 NOTE — B[facility] Group Notes (Signed)
Psychoeducational Group Note  Date:  03/10/2021 Time:  1300-1400   Group Topic/Focus: This is a continuation of the group from Saturday. Pt's have been asked to formulate a list of 30 positives about themselves. This list is to be read 2 times a day for 30 days, looking in a mirror. Changing patterns of negative self talk. Also discussed is the fact that there have been some people who hurt [place] in the past. We keep that memory alive within [place]. Ways to cope with this are discused   Participation Level:  Active  Participation Quality:  Appropriate  Affect:  Appropriate  Cognitive:  Oriented  Insight: Improving  Engagement in Group:  Engaged  Modes of Intervention:  Activity, Discussion, Education, and Support  Additional Comments:  Rates her energy at a 6/10. Sat quietly, paying attention to what was being said.  [name]

## 2021-03-10 NOTE — Progress Notes (Signed)
Patient has been up and out of her room more tonight. She reports that she has gone down for meals also. She looks brighter and reports that she feels a little bit better. Support given and safety maintained with 15 min checks.

## 2021-03-10 NOTE — Progress Notes (Addendum)
Ordered on the unit [facility] MD Progress Note  03/10/2021 3:03 PM Janet Welch  MRN:  [id number]  Chief Complaint: paranoia and psychosis  Subjective: Janet Welch reports: "I feel a little groggy today, but I'm doing okay."  Daily Notes, 03/10/2021: Pt's chart reviewed, her case discussed with her treatment team. Pt with flat affect and depressed mood, attention to personal hygiene and grooming is fair, eye contact is good, speech is clear & coherent, pt denies SI/HI/AVH. She reports paranoia, states that she thinks people in general are out to get her. Speech is clearer today with no delayed responses. Pt reports that her sleep quality last night was good, and reports appetite as fair. Pt is visible in the day room, and she is interacting well with her peers and is attending group sessions.  Pt denies any medication related side effects, but stated earlier in the day that she was feeling groggy. Grogginess has resolved and she is visible in the day room interacting with peers and attending activities. V/S are WNL. Pt denies being in any physical discomfort. Tongue tremors have resolved with the cessation of Abilify and the addition of Cogentin. Will continue Zyprexa 10 mg nightly for psychosis. Ceruloplasmin low at 16.2, 24 hr urine collection in process to rule out Wilson's disease.  HPI: Janet Welch is a 46 y.o. female with a history of bipolar d/o, who was initially admitted for inpatient psychiatric hospitalization on 03/04/2021 for management of paranoia and psychosis.   Principal Problem: Bipolar I disorder, current or most recent episode manic, severe with mood-congruent psychotic features ([place]) Diagnosis: Principal Problem:   Bipolar I disorder, current or most recent episode manic, severe with mood-congruent psychotic features ([place])  Past Psychiatric History: see H&P  Past Medical History:  Past Medical History:  Diagnosis Date   Bipolar 1 disorder ([place])    No pertinent past medical history      Past Surgical History:  Procedure Laterality Date   CESAREAN SECTION N/A 07/07/2013   Procedure: CESAREAN SECTION;  Surgeon: Linda Hedges, DO;  Location: [facility];  Service: Obstetrics;  Laterality: N/A;   LEG SKIN LESION  BIOPSY / EXCISION     scar tissue biopsy     Family History:  Family History  Problem Relation Age of Onset   Thyroid disease Mother    Mental illness Mother    Parkinson's disease Father    Stroke Maternal Grandmother    Stroke Maternal Grandfather    Family Psychiatric  History: see H&P  Social History:  Social History   Substance and Sexual Activity  Alcohol Use Yes   Alcohol/week: 0.0 - 1.0 standard drinks   Comment: socially     Social History   Substance and Sexual Activity  Drug Use Not Currently   Types: Marijuana    Social History   Socioeconomic History   Marital status: Married    Spouse name: Not on file   Number of children: Not on file   Years of education: Not on file   Highest education level: Not on file  Occupational History    Comment: [employer]  Tobacco Use   Smoking status: Never   Smokeless tobacco: Never  Vaping Use   Vaping Use: Never used  Substance and Sexual Activity   Alcohol use: Yes    Alcohol/week: 0.0 - 1.0 standard drinks    Comment: socially   Drug use: Not Currently    Types: Marijuana   Sexual activity: Yes    Birth control/protection:  None  Other Topics Concern   Not on file  Social History Narrative   Not on file   Social Determinants of Health   Financial Resource Strain: Not on file  Food Insecurity: Not on file  Transportation Needs: Not on file  Physical Activity: Not on file  Stress: Not on file  Social Connections: Not on file   Additional Social History:    Pain Medications: See MAR Prescriptions: See MAR Over the Counter: See MAR History of alcohol / drug use?: No history of alcohol / drug abuse Longest period of sobriety (when/how long): NA  Sleep: Good  Appetite:   Good  Current Medications: Current Facility-Administered Medications  Medication Dose Route Frequency Provider Last Rate Last Admin   acetaminophen (TYLENOL) tablet 650 mg  650 mg Oral Q6H PRN Ethelene Hal, NP       alum & mag hydroxide-simeth (MAALOX/MYLANTA) 200-200-20 MG/5ML suspension 30 mL  30 mL Oral Q4H PRN Ethelene Hal, NP       benztropine (COGENTIN) tablet 0.5 mg  0.5 mg Oral BID Nelda Marseille, Omari Mcmanaway E, MD   0.5 mg at 03/10/21 0947   docusate sodium (COLACE) capsule 100 mg  100 mg Oral Daily Nelda Marseille, Matias Thurman E, MD   100 mg at 03/10/21 0947   hydrOXYzine (ATARAX) tablet 25 mg  25 mg Oral TID PRN Ethelene Hal, NP   25 mg at 03/05/21 2110   OLANZapine zydis (ZYPREXA) disintegrating tablet 5 mg  5 mg Oral Q8H PRN Ethelene Hal, NP   5 mg at 03/06/21 3532   And   LORazepam (ATIVAN) tablet 1 mg  1 mg Oral PRN Ethelene Hal, NP       And   ziprasidone (GEODON) injection 20 mg  20 mg Intramuscular PRN Ethelene Hal, NP       magnesium hydroxide (MILK OF MAGNESIA) suspension 30 mL  30 mL Oral Daily PRN Ethelene Hal, NP       nitrofurantoin (macrocrystal-monohydrate) (MACROBID) capsule 100 mg  100 mg Oral Q12H Nkwenti, Doris, NP   100 mg at 03/10/21 0946   OLANZapine (ZYPREXA) tablet 10 mg  10 mg Oral QHS Nelda Marseille, Euretha Najarro E, MD   10 mg at 03/09/21 2027   polyethylene glycol (MIRALAX / GLYCOLAX) packet 17 g  17 g Oral Daily Nicholes Rough, NP   17 g at 03/10/21 1308   Lab Results:  No results found for this or any previous visit (from the past 48 hour(s)).   Blood Alcohol level:  Lab Results  Component Value Date   ETH <10 03/05/2021   ETH <10 99/24/2683    Metabolic Disorder Labs: Lab Results  Component Value Date   HGBA1C 4.9 03/05/2021   MPG 93.93 03/05/2021   MPG 99.67 11/22/2020   Lab Results  Component Value Date   PROLACTIN 23.6 (H) 03/07/2021   PROLACTIN 27.6 (H) 11/27/2020   Lab Results  Component Value Date   CHOL  160 03/05/2021   TRIG 54 03/05/2021   HDL 61 03/05/2021   CHOLHDL 2.6 03/05/2021   VLDL 11 03/05/2021   LDLCALC 88 03/05/2021   LDLCALC 79 11/27/2020   Physical Findings: AIMS: Facial and Oral Movements Muscles of Facial Expression: None, normal Lips and Perioral Area: None, normal Jaw: None, normal Tongue: None, normal,Extremity Movements Upper (arms, wrists, hands, fingers): None, normal Lower (legs, knees, ankles, toes): None, normal, Trunk Movements Neck, shoulders, hips: None, normal, Overall Severity Severity of abnormal movements (highest score  from questions above): None, normal Incapacitation due to abnormal movements: None, normal Patient's awareness of abnormal movements (rate only patient's report): No Awareness, Dental Status Current problems with teeth and/or dentures?: No Does patient usually wear dentures?: No   Musculoskeletal: Strength & Muscle Tone: within normal limits Gait & Station: normal, steady Patient leans: N/A  Psychiatric Specialty Exam:  Presentation  General Appearance: Appropriate for Environment; Fairly Groomed  Eye Contact:Good but staring quality  Speech:Nonspontaneous - speaks in short phrases to direct questions with occasional hesitancy of speech  Speech Volume:Normal  Mood and Affect  Mood:suspicious, aloof  Affect:guarded, constricted  Thought Process  Thought Processes:Linear for most of interview but derails at times and generally vague and evasive in her answers   Orientation:oriented to self, month, year and city but not situation  Thought Content: Appears paranoid and guarded on exam with intermittent thought blocking and some paucity of thought.  She denies AVH, ideas of reference or first rank symptoms.  She does not make delusional statements on exam.  History of Schizophrenia/Schizoaffective disorder:No  Duration of Psychotic Symptoms:Greater than six months  Hallucinations:Denied but has thought blocking  suggestive of some internal preoccupation  Ideas of Reference:Paranoia  Suicidal Thoughts:Denied  Homicidal Thoughts:Denied  Sensorium  Memory:Poor  Judgment:Fair  Insight:Shallow  Executive Functions  Concentration: Fair  Attention Span: [place]  Psychomotor Activity  Psychomotor Activity:No cogwheeling, stiffness, tremors or akathisias noted - specifically no tongue tremors  Assets  Assets:Communication Skills  Sleep  Fair  Physical Exam Vitals reviewed.  Constitutional:      Appearance: Normal appearance.  HENT:     Head: Normocephalic.     Nose: Nose normal. No congestion or rhinorrhea.  Eyes:     Pupils: Pupils are equal, round, and reactive to light.  Pulmonary:     Effort: Pulmonary effort is normal.  Musculoskeletal:        General: Normal range of motion.  Neurological:     General: No focal deficit present.     Mental Status: She is alert.   Review of Systems  Constitutional: Negative.  Negative for fever.  HENT: Negative.    Respiratory: Negative.  Negative for cough and shortness of breath.   Cardiovascular:  Negative for chest pain and palpitations.  Gastrointestinal:  Negative for abdominal pain, constipation, diarrhea, nausea and vomiting.  Neurological:  Negative for dizziness, focal weakness, loss of consciousness and headaches.  Psychiatric/Behavioral:  Positive for depression. Negative for hallucinations, substance abuse and suicidal ideas.   Blood pressure 119/81, pulse 98, temperature 98.3 F (36.8 C), temperature source Oral, resp. rate 18, height $RemoveBe'5\' 7"'UXINkGxVB$  (1.702 m), weight 77.8 kg, SpO2 97 %. Body mass index is 26.88 kg/m.  Treatment Plan Summary: Assessment:  Given report of husband that she had not been sleeping and had been acting erratically prior to admission, and given her past history of bipolar d/o, this is a presumed exacerbation of her bipolar illness. 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   Diagnoses / Active Problems: Bipolar I MRE manic with psychotic features by hx (r/o schizoaffective d/o bipolar type, r/o psychotic d/o due to general medical condition) UTI Hypokalemia  PLAN: Safety and Monitoring:  -- Voluntary admission to inpatient psychiatric unit for safety, stabilization and treatment  -- Daily contact with patient to assess and evaluate symptoms and progress in treatment  --  Patient's case to be discussed in multi-disciplinary team meeting  -- Observation Level : q15 minute checks  -- Vital signs:  q12 hours  -- Precautions: suicide, elopement, and assault  2. Psychiatric Diagnoses and Treatment:   Bipolar I MRE manic with psychotic features by hx (r/o schizoaffective d/o bipolar type, r/o psychotic d/o due to general medical condition)  -- Stopped Abilify due to abrupt decompensation with med change and report of akathisia on medication  -- Continue Zyprexa 24m qhs - consider dose increase if paranoia persists  -- Metabolic profile and EKG monitoring obtained while on an atypical antipsychotic (Lipid panel WNL, A1c 4.9, QTC 4241m  -- Prolactin level 23.6 (was 27.6 3 months ago and 31.8 3 years ago) - will need f/u with PCP after discharge  -- Continue Cogentin 0.53m10mid with AIMS monitoring  -- Due to oversedation, discontinued Trazodone PRN  -- Psychosis w/u: Head CT noncontrast negative;  ESR 1,  B12 264, HIV nonreactive, TSH 1.407, alcohol less than 10, CMP within normal limits other than a potassium of 3.0 on admission. Repeat CMP on 2/16 with Potassium at 4.1 (WNL). Glucose of 113, AST 15 and ALT 15, WBC 6.2, UDS negative RPR non reactive, ceruloplasmin low at 16.2, heavy metal pending, and ANA negative.  -- Encouraged patient to participate in unit milieu and in scheduled group therapies       3. Medical Issues Being Addressed:   UTI  -- Continue Macrobid 100m21md for 7 days - susceptible to Macrobid per sensitivity report  -- Preliminary culture results show 60,000 CFU E coli and >100,000 CFU streptococcus agalactiae   Hypokalemia - resolved  - Patient given K+ replacement and repeat BMP shows K+ 4.1   Constipation  -- Continue colace 100mg30mly and push po fluids  -- Miralax 17g daily added   Low Ceruloplasmin  - Checking 24 hour urine copper and serum copper levels as part of wilson's disease w/u  4. Discharge Planning:   -- Social work and case management to assist with discharge planning and identification of hospital follow-up needs prior to discharge  -- Discharge Concerns: Need to establish a safety plan; Medication compliance and effectiveness  -- Discharge Goals: Return home with outpatient referrals for mental health follow-up including medication management/psychotherapy  DorisNicholes Rough2/19/2023, 3:03 PM Patient ID: Janet Welch   DOB: 12/17/1975/12/12y.47   MRN: [id number]

## 2021-03-10 NOTE — [facility] Group Notes (Signed)
[place] Group Notes:  (Nursing/MHT/Case Management/Adjunct)  Date:  03/10/2021  Time:  9:23 PM  Type of Therapy: Wrap-Up  Participation Level:  Active  Participation Quality:  Appropriate and Attentive  Affect:  Appropriate  Cognitive:  Appropriate  Insight:  Appropriate and Improving  Engagement in Group:  Improving  Modes of Intervention:  Discussion  Summary of Progress/Problems: PT attended group and was quiet. She shared that she has been enjoying everyone and that she wants to work on her discharge plan.   Maxine Glenn 03/10/2021, 9:23 PM

## 2021-03-10 NOTE — Progress Notes (Signed)
°   03/10/21 2100  Psych Admission Type (Psych Patients Only)  Admission Status Voluntary  Psychosocial Assessment  Patient Complaints None  Eye Contact Brief  Facial Expression Flat  Affect Appropriate to circumstance  Speech Soft  Interaction Minimal  Motor Activity  (wnl)  Appearance/Hygiene Unremarkable  Behavior Characteristics Cooperative;Appropriate to situation  Mood Depressed  Thought Process  Coherency WDL  Content WDL  Delusions None reported or observed  Perception WDL  Hallucination None reported or observed  Judgment Poor  Confusion None  Danger to Self  Current suicidal ideation? Denies  Danger to Others  Danger to Others None reported or observed

## 2021-03-10 NOTE — [facility] Group Notes (Signed)
Adult Psychoeducational Group Not Date:  03/10/2021 Time:  0900-1045 Group Topic/Focus: PROGRESSIVE RELAXATION. A group where deep breathing is taught and tensing and relaxation muscle groups is used. Imagery is used as well.  Pts are asked to imagine 3 pillars that hold them up when they are not able to hold themselves up.  Participation Level:  Active  Participation Quality:  Appropriate  Affect:  Appropriate  Cognitive:  Oriented  Insight: Improving  Engagement in Group:  Engaged  Modes of Intervention:  Activity, Discussion, Education, and Support  Additional Comments:  faith, family and friends.  [name]

## 2021-03-10 NOTE — Group Note (Signed)
[place] LCSW Group Therapy Note  03/10/2021    Type of Therapy and Topic:  Group Therapy:  Adding Supports Including Yourself  Participation Level:  None   Description of Group:   Patients in this group were introduced to the concept that additional supports including self-support are an essential part of recovery.  Patients listed their current healthy and unhealthy supports, and discussed the difference between the two.   The song "My Own Hero" was played and a group discussion ensued in which patients stated they could relate to the song which inspired them to realize they have be willing to help themselves in order to succeed, because other people cannot achieve sobriety or stability for them.  Parents were encouraged toward self-advocacy and self-support as part of their recovery.  Establishment of boundaries was described and demonstrated with group members expressing comprehension.  The importance of establishing supports for each need was discussed.  Therapeutic Goals: 1)  explain the difference between healthy and unhealthy supports and discuss what specific supports are currently in patients' lives 2)  demonstrate the importance of being a key part of one's own support system 3)  discuss the need for appropriate boundaries with supports 4)  elicit ideas from patients about adding supports to meet needs   Summary of Patient Progress:   The patient left the group just before it was her turn to share.  She returned later and sat silently, did not volunteer any comments.  Therapeutic Modalities:   Motivational Interviewing Activity  Janet Welch

## 2021-03-10 NOTE — Progress Notes (Signed)
24 Hr Urine started at  03/10/21 @ 1420

## 2021-03-10 NOTE — Progress Notes (Signed)
°   03/10/21 1400  Psych Admission Type (Psych Patients Only)  Admission Status Voluntary  Psychosocial Assessment  Patient Complaints None  Eye Contact Brief  Facial Expression Flat  Affect Appropriate to circumstance  Speech Soft  Interaction Minimal  Motor Activity Slow  Appearance/Hygiene Unremarkable  Behavior Characteristics Cooperative;Appropriate to situation  Mood Depressed  Thought Process  Coherency WDL  Content Paranoia  Delusions WDL  Perception Hallucinations  Hallucination None reported or observed  Judgment Poor  Confusion None  Danger to Self  Current suicidal ideation? Denies  Danger to Others  Danger to Others None reported or observed

## 2021-03-11 ENCOUNTER — Encounter (HOSPITAL_COMMUNITY): Payer: Self-pay

## 2021-03-11 MED ORDER — OLANZAPINE 7.5 MG PO TABS
15.0000 mg | ORAL_TABLET | Freq: Every day | ORAL | Status: DC
  Administered 2021-03-11: 15 mg via ORAL
  Filled 2021-03-11 (×3): qty 2

## 2021-03-11 NOTE — Plan of Care (Addendum)
I called the patient's husband and call went straight to voicemail. I left a message asking for return call and explaining I will be transitioning off service for the week. I advised that SW has still been trying to connect with him and advised that if he does not call back today, that he will need to try to speak to Dr. Magdalen Spatz tomorrow who will be the new attending on service.   Viann Fish, MD, FAPA  Husband returned call and we spoke for 20 minutes.He states that he talked to her on the phone and she was evasive with him when he asked about her medication compliance prior to admission which he interprets as her not taking her meds. He states that he has encouraged her to comply with medication and treatments as an ultimatum to being able to return home. He states that he has an aunt on Lithium and Haldol dec for schizophrenia and questions if this would be an option for his wife.  I discussed that lithium has a lot of required laboratory monitoring that she would have to have buy in for taking  and that Haldol is a first generation medication that does come with potentially more side effect risks.  I advised that we would like to try to get her stabilized on Zyprexa and when she is clearer we can talk to her about LAI options including Haldol, Invega, Risperdal or Abilify.  I discussed her elevated prolactin levels and the hope that we can avoid a first generation medication or use a more prolactin sparing medication for maintenance if possible.  I also discussed her low ceruloplasmin and the work-up presently with a serum and 24-hour urine copper as part of Wilson's disease work-up.  He is in agreement with our ongoing treatment plan.  I made him aware that social work has been trying to reach him and he will try to have his phone available for subsequent calls.  He is aware that I will be rotating off service for the week and he does request a family meeting prior to patient's discharge which we  can coordinate as she continues to clear.  Viann Fish, MD, [facility]

## 2021-03-11 NOTE — Plan of Care (Signed)
Nurse discussed anxiety and coping skills with patient. 

## 2021-03-11 NOTE — Progress Notes (Signed)
Nurse called [facility] about copper urine 24 hour.  Container is close to being full .  Lab said  they will pick up when lab comes at 1800.  Urine is in lab refrigerator, paperwork enclosed also.

## 2021-03-11 NOTE — Progress Notes (Signed)
°   03/11/21 2300  Psych Admission Type (Psych Patients Only)  Admission Status Voluntary  Psychosocial Assessment  Patient Complaints Anxiety  Eye Contact Fair  Facial Expression Flat  Affect Sad  Speech Soft  Interaction Minimal  Motor Activity Slow  Appearance/Hygiene Unremarkable  Behavior Characteristics Cooperative;Calm  Mood Anxious;Sad  Aggressive Behavior  Effect No apparent injury  Thought Process  Coherency WDL  Content WDL  Delusions None reported or observed  Perception WDL  Hallucination None reported or observed  Judgment Poor  Confusion None  Danger to Self  Current suicidal ideation? Passive  Self-Injurious Behavior Some self-injurious ideation observed or expressed.  No lethal plan expressed   Agreement Not to Harm Self Yes  Description of Agreement verbal contract for safety  Danger to Others  Danger to Others None reported or observed

## 2021-03-11 NOTE — BH IP Treatment Plan (Signed)
Interdisciplinary Treatment and Diagnostic Plan Update  03/11/2021 Time of Session: 9:15am Janet Welch MRN: [id number]  Principal Diagnosis: Bipolar I disorder, current or most recent episode manic, severe with mood-congruent psychotic features ([place])  Secondary Diagnoses: Principal Problem:   Bipolar I disorder, current or most recent episode manic, severe with mood-congruent psychotic features ([place])   Current Medications:  Current Facility-Administered Medications  Medication Dose Route Frequency Provider Last Rate Last Admin   acetaminophen (TYLENOL) tablet 650 mg  650 mg Oral Q6H PRN Ethelene Hal, NP       alum & mag hydroxide-simeth (MAALOX/MYLANTA) 200-200-20 MG/5ML suspension 30 mL  30 mL Oral Q4H PRN Ethelene Hal, NP       benztropine (COGENTIN) tablet 0.5 mg  0.5 mg Oral BID Singleton, Amy E, MD   0.5 mg at 03/11/21 0804   docusate sodium (COLACE) capsule 100 mg  100 mg Oral Daily Viann Fish E, MD   100 mg at 03/10/21 0947   hydrOXYzine (ATARAX) tablet 25 mg  25 mg Oral TID PRN Ethelene Hal, NP   25 mg at 03/10/21 2140   OLANZapine zydis (ZYPREXA) disintegrating tablet 5 mg  5 mg Oral Q8H PRN Ethelene Hal, NP   5 mg at 03/06/21 9323   And   LORazepam (ATIVAN) tablet 1 mg  1 mg Oral PRN Ethelene Hal, NP       And   ziprasidone (GEODON) injection 20 mg  20 mg Intramuscular PRN Ethelene Hal, NP       magnesium hydroxide (MILK OF MAGNESIA) suspension 30 mL  30 mL Oral Daily PRN Ethelene Hal, NP       nitrofurantoin (macrocrystal-monohydrate) (MACROBID) capsule 100 mg  100 mg Oral Q12H Nkwenti, Doris, NP   100 mg at 03/11/21 0804   OLANZapine (ZYPREXA) tablet 10 mg  10 mg Oral QHS Singleton, Amy E, MD   10 mg at 03/10/21 2140   polyethylene glycol (MIRALAX / GLYCOLAX) packet 17 g  17 g Oral Daily Nicholes Rough, NP   17 g at 03/11/21 0807   PTA Medications: Medications Prior to Admission  Medication Sig Dispense  Refill Last Dose   eszopiclone (LUNESTA) 2 MG TABS tablet Take 1 tablet (2 mg total) by mouth at bedtime. Take immediately before bedtime (Patient taking differently: Take 2 mg by mouth at bedtime. Take immediately before bedtime) 30 tablet 1    Magnesium 200 MG TABS Take 1 tablet (200 mg total) by mouth daily. Take with evening meal 30 tablet 1    OLANZapine (ZYPREXA) 7.5 MG tablet Take 1 tablet (7.5 mg total) by mouth at bedtime. 30 tablet 0    traZODone (DESYREL) 50 MG tablet Take 1 tablet (50 mg total) by mouth at bedtime as needed for sleep. 30 tablet 1     Patient Stressors: Marital or family conflict    Patient Strengths: Physical Health   Treatment Modalities: Medication Management, Group therapy, Case management,  1 to 1 session with clinician, Psychoeducation, Recreational therapy.   Physician Treatment Plan for Primary Diagnosis: Bipolar I disorder, current or most recent episode manic, severe with mood-congruent psychotic features ([place]) Long Term Goal(s): Improvement in symptoms so as ready for discharge   Short Term Goals: Ability to disclose and discuss suicidal ideas Ability to demonstrate self-control will improve Ability to identify and develop effective coping behaviors will improve Compliance with prescribed medications will improve  Medication Management: Evaluate patient's response, side effects, and tolerance of  medication regimen.  Therapeutic Interventions: 1 to 1 sessions, Unit Group sessions and Medication administration.  Evaluation of Outcomes: Progressing  Physician Treatment Plan for Secondary Diagnosis: Principal Problem:   Bipolar I disorder, current or most recent episode manic, severe with mood-congruent psychotic features ([place])  Long Term Goal(s): Improvement in symptoms so as ready for discharge   Short Term Goals: Ability to disclose and discuss suicidal ideas Ability to demonstrate self-control will improve Ability to identify and develop  effective coping behaviors will improve Compliance with prescribed medications will improve     Medication Management: Evaluate patient's response, side effects, and tolerance of medication regimen.  Therapeutic Interventions: 1 to 1 sessions, Unit Group sessions and Medication administration.  Evaluation of Outcomes: Progressing   RN Treatment Plan for Primary Diagnosis: Bipolar I disorder, current or most recent episode manic, severe with mood-congruent psychotic features ([place]) Long Term Goal(s): Knowledge of disease and therapeutic regimen to maintain health will improve  Short Term Goals: Ability to remain free from injury will improve, Ability to verbalize frustration and anger appropriately will improve, Ability to demonstrate self-control, Ability to participate in decision making will improve, Ability to verbalize feelings will improve, and Compliance with prescribed medications will improve  Medication Management: RN will administer medications as ordered by provider, will assess and evaluate patient's response and provide education to patient for prescribed medication. RN will report any adverse and/or side effects to prescribing provider.  Therapeutic Interventions: 1 on 1 counseling sessions, Psychoeducation, Medication administration, Evaluate responses to treatment, Monitor vital signs and CBGs as ordered, Perform/monitor CIWA, COWS, AIMS and Fall Risk screenings as ordered, Perform wound care treatments as ordered.  Evaluation of Outcomes: Progressing   LCSW Treatment Plan for Primary Diagnosis: Bipolar I disorder, current or most recent episode manic, severe with mood-congruent psychotic features ([place]) Long Term Goal(s): Safe transition to appropriate next level of care at discharge, Engage patient in therapeutic group addressing interpersonal concerns.  Short Term Goals: Engage patient in aftercare planning with referrals and resources, Increase social support, Increase  ability to appropriately verbalize feelings, Increase emotional regulation, Identify triggers associated with mental health/substance abuse issues, and Increase skills for wellness and recovery  Therapeutic Interventions: Assess for all discharge needs, 1 to 1 time with Social worker, Explore available resources and support systems, Assess for adequacy in community support network, Educate family and significant other(s) on suicide prevention, Complete Psychosocial Assessment, Interpersonal group therapy.  Evaluation of Outcomes: Progressing   Progress in Treatment: Attending groups: Yes. Participating in groups: Yes. Taking medication as prescribed: Yes. Toleration medication: Yes. Family/Significant other contact made: Yes, individual(s) contacted:  Husband  Patient understands diagnosis: No. Discussing patient identified problems/goals with staff: Yes. Medical problems stabilized or resolved: Yes. Denies suicidal/homicidal ideation: Yes. Issues/concerns per patient self-inventory: No.     New problem(s) identified: No, Describe:  None    New Short Term/Long Term Goal(s): medication stabilization, elimination of SI thoughts, development of comprehensive mental wellness plan.    Patient Goals: "To manage my anxiety"    Discharge Plan or Barriers: Patient is to return home and follow up with [facility] for therapy and medication management   Reason for Continuation of Hospitalization: Anxiety Delusions  Hallucinations Medication stabilization   Estimated Length of Stay: 3 to 5 days    Scribe for Treatment Team: Vassie Moselle, LCSW 03/11/2021 10:16 AM

## 2021-03-11 NOTE — Progress Notes (Signed)
The patient attended the evening A.A.meeting and was appropriate.  

## 2021-03-11 NOTE — Progress Notes (Signed)
D:  Patient self inventory sheet, patient has fair sleep, sleep medication helpful.  Good appetite, low energy level, poor concentration.  Rated depression 6, hopeless 5, anxiety 7.  Denied withdrawals.  Denied SI.  Physical problems, lightheaded, dizzy, headaches, blurred vision.  Physical pain, stomach pain #7 in past 24 hours.  Goal is discharge home.  Plans to call her daughter.  Does have discharge plans. A:  Medications administered per MD orders.  Emotional support and encouragement given patient. R:  Denied SI and HI, contracts for safety.  Denied A/V hallucinations.  Safety maintained with 15 minute checks.

## 2021-03-11 NOTE — [facility] Suicide Risk Assessment (Signed)
[facility] INPATIENT:  Family/Significant Other Suicide Prevention Education  Suicide Prevention Education:  Education Completed; Ashley Bultema,  [phone]  (name of family member/significant other) has been identified by the patient as the family member/significant other with whom the patient will be residing, and identified as the person(s) who will aid the patient in the event of a mental health crisis (suicidal ideations/suicide attempt).  With written consent from the patient, the family member/significant other has been provided the following suicide prevention education, prior to the and/or following the discharge of the patient.  CSW spoke with husband who reports that he was afraid that patient was going to hurt herself or the kids and that is why he brought her to the hospital.  Husband reports that patient has not done anything at this time to hurt her children and that the children are safe.  Husband confirmed that he found out patient wasn't consistently taking her medication.  At discharge, patient only option is to return home.  No guns/weapons.  Husband does not believe patient is ready to be discharged at this time. He plans to visit tonight and has talked on the phone with her.  Husband is up for a family meeting as we get closer to discharge.   The suicide prevention education provided includes the following: Suicide risk factors Suicide prevention and interventions National Suicide Hotline telephone number [facility] assessment telephone number [facility] [place] and/or Residential Mobile Crisis Unit telephone number  Request made of family/significant other to: Remove weapons (e.g., guns, rifles, knives), all items previously/currently identified as safety concern.   Remove drugs/medications (over-the-counter, prescriptions, illicit drugs), all items previously/currently identified as a safety concern.  The family member/significant  other verbalizes understanding of the suicide prevention education information provided.  The family member/significant other agrees to remove the items of safety concern listed above.  Tarren Velardi E Taishawn Smaldone 03/11/2021, 3:24 PM

## 2021-03-11 NOTE — Group Note (Signed)
Recreation Therapy Group Note   Group Topic:Stress Management  Group Date: 03/11/2021 Start Time: 0930 End Time: 0950 Facilitators: Victorino Sparrow, [place] Location: [address]   Goal Area(s) Addresses:  Patient will identify positive stress management techniques. Patient will identify benefits of using stress management post d/c.   Group Description:  Meditation, Stretching.  LRT played a meditation that focused on positive thoughts while also leading the group through a series of stretches to help combat any negative thinking.  Patients were to listen and follow along as meditation played to fully engage.  LRT discussed with patients on were to get more stress management tools such as Youtube, Apps, yoga, etc.   Affect/Mood: Appropriate   Participation Level: Moderate   Participation Quality: Independent   Behavior: Appropriate   Speech/Thought Process: Focused   Insight: Good   Judgement: Good   Modes of Intervention: Meditation, Stretching   Patient Response to Interventions:  Attentive   Education Outcome:  Acknowledges education and In group clarification offered    Clinical Observations/Individualized Feedback: Pt sat quietly during session.  Pt didn't participate in stretching.     Plan: Continue to engage patient in RT group sessions 2-3x/week.   Victorino Sparrow, LRT,CTRS 03/11/2021 12:07 PM

## 2021-03-11 NOTE — [facility] Group Notes (Signed)
Spiritual care group on grief and loss facilitated by chaplains Amy Burris, Mdiv and Janne Napoleon, [facility]   Group Goal:   Support / Education around grief and loss   Members engage in facilitated group support and psycho-social education.   Group Description:  Spiritual care group on grief and loss facilitated by chaplain Janne Napoleon, [facility]   Group Goal:   Support / Education around grief and loss   Members engage in facilitated group support and psycho-social education.   Group Description:   Following introductions and group rules, group members engaged in facilitated group dialog and support around topic of loss, with particular support around experiences of loss in their lives. Group Identified types of loss (relationships / self / things) and identified patterns, circumstances, and changes that precipitate losses. Reflected on thoughts / feelings around loss, normalized grief responses, and recognized variety in grief experience. Group noted Worden's four tasks of grief in discussion.   Group drew on Adlerian / Rogerian, narrative, [place],   Patient Progress: Patient did not attend this group.  Amy L/ Deloria Lair, MDiv

## 2021-03-11 NOTE — Group Note (Signed)
[facility] LCSW Group Therapy Note   Group Date: 03/11/2021 Start Time: 1300 End Time: 1400   Type of Therapy/Topic:  Group Therapy:  Balance in Life  Participation Level:  Active   Description of Group:    This group will address the concept of balance and how it feels and looks when one is unbalanced. Patients will be encouraged to process areas in their lives that are out of balance, and identify reasons for remaining unbalanced. Facilitators will guide patients utilizing problem- solving interventions to address and correct the stressor making their life unbalanced. Understanding and applying boundaries will be explored and addressed for obtaining  and maintaining a balanced life. Patients will be encouraged to explore ways to assertively make their unbalanced needs known to significant others in their lives, using other group members and facilitator for support and feedback.  Therapeutic Goals: Patient will identify two or more emotions or situations they have that consume much of in their lives. Patient will identify signs/triggers that life has become out of balance:  Patient will identify two ways to set boundaries in order to achieve balance in their lives:  Patient will demonstrate ability to communicate their needs through discussion and/or role plays  Summary of Patient Progress:    Pt shared that one positive thing that occurred today was that she came to group.    Therapeutic Modalities:   Cognitive Behavioral Therapy Solution-Focused Therapy Assertiveness Training   Mliss Fritz, [place]

## 2021-03-11 NOTE — Progress Notes (Addendum)
Ordered on the unit [facility] MD Progress Note  03/11/2021 2:25 PM Janet Welch  MRN:  [id number]  Chief Complaint: paranoia and psychosis  Subjective: Janet Welch reports: "I think I am paranoid still.Marland Kitchenibuprofen just want to go home... I have been here a few times already. I feel like I'm seeing the same people over and over again. It's making me nervous."  Daily Notes, 03/11/2021: Pt's chart reviewed, her case discussed with her treatment team. For this encounter, pt is seen in her room with the attending Psychiatrist. Pt with flat affect and depressed mood, she seems preoccupied while talking to this NP and attending Psychiatrist. Her attention to personal hygiene and grooming is fair, eye contact is good, speech with some latency, pt denies SI/HI/AVH. She reports paranoia, states that she has been to this [facility] a few times, and she feels anxious seeing the same people time and time again. She reports feeling sometimes like other people can read her thoughts, and reports feeling like people are taking thoughts out of her head, and putting other thoughts in. Pt reports that her sleep quality last night was good, and reports appetite as fair. Pt is visible in the day room, and she is interacting well with her peers and is attending group sessions, and occasionally takes naps. She states that her husband has dropped off clothes for her, but has not visited, and that she has talked to her husband and son on the phone.   Pt complaining of a heaviness sensation in her b/l arms, states that it is related to the Zyprexa. Pt with restlessness, alternated between sitting on bed and walking in the room during this encounter. Tongue and arm tremors have resolved since starting Cogentin. She reports having a bowel movement yesterday.  V/S currently are WNL. Pt denies being in any physical discomfort. Will increase Zyprexa to  15 mg nightly for management of psychosis. Pt agreeable. Ceruloplasmin low at 16.2, 24 hr urine  collection in process to rule out Wilson's disease.  HPI: Janet Welch is a 46 y.o. female with a history of bipolar d/o, who was initially admitted for inpatient psychiatric hospitalization on 03/04/2021 for management of paranoia and psychosis.   Principal Problem: Bipolar I disorder, current or most recent episode manic, severe with mood-congruent psychotic features ([place]) Diagnosis: Principal Problem:   Bipolar I disorder, current or most recent episode manic, severe with mood-congruent psychotic features ([place])  Past Psychiatric History: see H&P  Past Medical History:  Past Medical History:  Diagnosis Date   Bipolar 1 disorder ([place])    No pertinent past medical history     Past Surgical History:  Procedure Laterality Date   CESAREAN SECTION N/A 07/07/2013   Procedure: CESAREAN SECTION;  Surgeon: Linda Hedges, DO;  Location: [facility];  Service: Obstetrics;  Laterality: N/A;   LEG SKIN LESION  BIOPSY / EXCISION     scar tissue biopsy     Family History:  Family History  Problem Relation Age of Onset   Thyroid disease Mother    Mental illness Mother    Parkinson's disease Father    Stroke Maternal Grandmother    Stroke Maternal Grandfather    Family Psychiatric  History: see H&P  Social History:  Social History   Substance and Sexual Activity  Alcohol Use Yes   Alcohol/week: 0.0 - 1.0 standard drinks   Comment: socially     Social History   Substance and Sexual Activity  Drug Use Not Currently   Types:  Marijuana    Social History   Socioeconomic History   Marital status: Married    Spouse name: Not on file   Number of children: Not on file   Years of education: Not on file   Highest education level: Not on file  Occupational History    Comment: [employer]  Tobacco Use   Smoking status: Never   Smokeless tobacco: Never  Vaping Use   Vaping Use: Never used  Substance and Sexual Activity   Alcohol use: Yes    Alcohol/week: 0.0 - 1.0 standard drinks     Comment: socially   Drug use: Not Currently    Types: Marijuana   Sexual activity: Yes    Birth control/protection: None  Other Topics Concern   Not on file  Social History Narrative   Not on file   Social Determinants of Health   Financial Resource Strain: Not on file  Food Insecurity: Not on file  Transportation Needs: Not on file  Physical Activity: Not on file  Stress: Not on file  Social Connections: Not on file   Additional Social History:    Pain Medications: See MAR Prescriptions: See MAR Over the Counter: See MAR History of alcohol / drug use?: No history of alcohol / drug abuse Longest period of sobriety (when/how long): NA  Sleep: Good  Appetite:  Good  Current Medications: Current Facility-Administered Medications  Medication Dose Route Frequency Provider Last Rate Last Admin   acetaminophen (TYLENOL) tablet 650 mg  650 mg Oral Q6H PRN Ethelene Hal, NP       alum & mag hydroxide-simeth (MAALOX/MYLANTA) 200-200-20 MG/5ML suspension 30 mL  30 mL Oral Q4H PRN Ethelene Hal, NP       benztropine (COGENTIN) tablet 0.5 mg  0.5 mg Oral BID Kaylah Chiasson E, MD   0.5 mg at 03/11/21 2878   hydrOXYzine (ATARAX) tablet 25 mg  25 mg Oral TID PRN Ethelene Hal, NP   25 mg at 03/10/21 2140   OLANZapine zydis (ZYPREXA) disintegrating tablet 5 mg  5 mg Oral Q8H PRN Ethelene Hal, NP   5 mg at 03/06/21 6767   And   LORazepam (ATIVAN) tablet 1 mg  1 mg Oral PRN Ethelene Hal, NP       And   ziprasidone (GEODON) injection 20 mg  20 mg Intramuscular PRN Ethelene Hal, NP       magnesium hydroxide (MILK OF MAGNESIA) suspension 30 mL  30 mL Oral Daily PRN Ethelene Hal, NP       nitrofurantoin (macrocrystal-monohydrate) (MACROBID) capsule 100 mg  100 mg Oral Q12H Nkwenti, Doris, NP   100 mg at 03/11/21 0804   OLANZapine (ZYPREXA) tablet 15 mg  15 mg Oral QHS Nkwenti, Doris, NP       polyethylene glycol (MIRALAX / GLYCOLAX)  packet 17 g  17 g Oral Daily Nkwenti, Doris, NP   17 g at 03/11/21 2094   Lab Results:  No results found for this or any previous visit (from the past 75 hour(s)).   Blood Alcohol level:  Lab Results  Component Value Date   [facility] <10 03/05/2021   ETH <10 70/96/2836    Metabolic Disorder Labs: Lab Results  Component Value Date   HGBA1C 4.9 03/05/2021   MPG 93.93 03/05/2021   MPG 99.67 11/22/2020   Lab Results  Component Value Date   PROLACTIN 23.6 (H) 03/07/2021   PROLACTIN 27.6 (H) 11/27/2020   Lab Results  Component  Value Date   CHOL 160 03/05/2021   TRIG 54 03/05/2021   HDL 61 03/05/2021   CHOLHDL 2.6 03/05/2021   VLDL 11 03/05/2021   LDLCALC 88 03/05/2021   LDLCALC 79 11/27/2020   Physical Findings: AIMS: Facial and Oral Movements Muscles of Facial Expression: None, normal Lips and Perioral Area: None, normal Jaw: None, normal Tongue: None, normal,Extremity Movements Upper (arms, wrists, hands, fingers): None, normal Lower (legs, knees, ankles, toes): None, normal, Trunk Movements Neck, shoulders, hips: None, normal, Overall Severity Severity of abnormal movements (highest score from questions above): None, normal Incapacitation due to abnormal movements: None, normal Patient's awareness of abnormal movements (rate only patient's report): No Awareness, Dental Status Current problems with teeth and/or dentures?: No Does patient usually wear dentures?: No   Musculoskeletal: Strength & Muscle Tone: within normal limits Gait & Station: normal, steady Patient leans: N/A  Psychiatric Specialty Exam:  Presentation  General Appearance: Appropriate for Environment; Fairly Groomed  Eye Contact:Good but staring quality  Speech:Nonspontaneous - speaks in short phrases to direct questions with occasional latency of speech  Speech Volume:Normal  Mood and Affect  Mood:suspicious, aloof  Affect:guarded, constricted  Thought Process  Thought Processes:Linear  for most of interview but derails at times and generally vague and evasive in her answers   Orientation:oriented to self, month, year and city but not situation  Thought Content: Appears paranoid and guarded on exam with intermittent thought blocking and some paucity of thought.  She denies AVH, or ideas of reference but admits to belief in thought insertion/broadcasting and residual paranoia.    History of Schizophrenia/Schizoaffective disorder:No  Duration of Psychotic Symptoms:Greater than six months  Hallucinations:Denied but has thought blocking suggestive of some internal preoccupation  Ideas of Reference:Denied  Suicidal Thoughts:Denied  Homicidal Thoughts:Denied  Sensorium  Memory:Poor  Judgment:Poor  Insight:Shallow  Executive Functions  Concentration: Fair  Attention Span: Magna  Psychomotor Activity  Psychomotor Activity:No cogwheeling, stiffness, tremors or akathisias noted - specifically no tongue tremors  Assets  Assets:Communication Skills; Housing; Social Support  Sleep  Total time unrecorded  Physical Exam Vitals reviewed.  Constitutional:      Appearance: Normal appearance.  HENT:     Head: Normocephalic.     Nose: Nose normal. No congestion or rhinorrhea.  Eyes:     Pupils: Pupils are equal, round, and reactive to light.  Pulmonary:     Effort: Pulmonary effort is normal.  Musculoskeletal:        General: Normal range of motion.  Neurological:     General: No focal deficit present.     Mental Status: She is alert.   Review of Systems  Constitutional: Negative.  Negative for fever.  HENT: Negative.    Respiratory: Negative.  Negative for cough and shortness of breath.   Cardiovascular:  Negative for chest pain and palpitations.  Gastrointestinal:  Negative for abdominal pain, constipation, diarrhea, nausea and vomiting.  Neurological:  Negative for dizziness, focal weakness, loss  of consciousness and headaches.  Psychiatric/Behavioral:  Positive for depression. Negative for hallucinations, substance abuse and suicidal ideas.   Blood pressure 115/77, pulse 98, temperature 98.2 F (36.8 C), temperature source Oral, resp. rate 18, height 5' 7"  (1.702 m), weight 77.8 kg, SpO2 99 %. Body mass index is 26.88 kg/m.  Treatment Plan Summary: Assessment:  Given report of husband that she had not been sleeping and had been acting erratically prior to admission, and given her past history of bipolar  d/o, this is a presumed exacerbation of her bipolar illness. 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   Diagnoses / Active Problems: Bipolar I MRE manic with psychotic features by hx (r/o schizoaffective d/o bipolar type, r/o psychotic d/o due to general medical condition) UTI Hypokalemia Low Ceruloplasmin  PLAN: Safety and Monitoring:  -- Voluntary admission to inpatient psychiatric unit for safety, stabilization and treatment - if patient attempts to leave will need IVC  -- Daily contact with patient to assess and evaluate symptoms and progress in treatment  -- Patient's case to be discussed in multi-disciplinary team meeting  -- Observation Level : q15 minute checks  -- Vital signs:  q12 hours  -- Precautions: suicide  2. Psychiatric Diagnoses and Treatment:   Bipolar I MRE manic with psychotic features by hx (r/o schizoaffective d/o bipolar type, r/o psychotic d/o due to general medical condition)  -- Stopped Abilify due to abrupt decompensation with med change and report of akathisia on medication  -- Increase Zyprexa to 15 mg qhs   -- Metabolic profile and EKG monitoring obtained while on an atypical antipsychotic (Lipid panel WNL, A1c 4.9, QTC 477m)  -- Prolactin level 23.6 (was 27.6 3 months ago and 31.8 3 years ago) - will need  f/u with PCP after discharge  -- Continue Cogentin 0.584mbid with AIMS monitoring  -- Due to oversedation, discontinued Trazodone PRN  -- Psychosis w/u: Head CT noncontrast negative;  ESR 1,  B12 264, HIV nonreactive, TSH 1.407, alcohol less than 10, CMP within normal limits other than a potassium of 3.0 on admission. Repeat CMP on 2/16 with Potassium at 4.1 (WNL). Glucose of 113, AST 15 and ALT 15, WBC 6.2, UDS negative, RPR non reactive, ceruloplasmin low at 16.2, heavy metal pending, and ANA negative.  -- Encouraged patient to participate in unit milieu and in scheduled group therapies      3. Medical Issues Being Addressed:   UTI  -- Continue Macrobid 10033mid for 7 days - susceptible to Macrobid per sensitivity report  -- Preliminary culture results show 60,000 CFU E coli and >100,000 CFU streptococcus agalactiae   Hypokalemia - resolved  - Patient given K+ replacement and repeat BMP shows K+ 4.1   Constipation  -- Discontinue colace 100m81mily              --Continue to push po fluids  -- Continue Miralax 17g daily added   Low Ceruloplasmin  - Checking 24 hour urine copper and serum copper levels as part of wilson's disease w/u  4. Discharge Planning:   -- Social work and case management to assist with discharge planning and identification of hospital follow-up needs prior to discharge  -- Discharge Concerns: Need to establish a safety plan; Medication compliance and effectiveness  -- Discharge Goals: Return home with outpatient referrals for mental health follow-up including medication management/psychotherapy  DoriNicholes Welch 03/11/2021, 2:25 PM Patient ID: Janet Welch   DOB: 01/2320-Jul-1977 y64.   MRN: [id number]

## 2021-03-12 LAB — HEAVY METALS, BLOOD
Arsenic: 2 ug/L (ref 0–9)
Lead: <1 ug/dL (ref 0.0–3.4)
Mercury: <1 ug/L (ref 0.0–14.9)

## 2021-03-12 LAB — COPPER, SERUM: Copper: 78 ug/dL — ABNORMAL LOW (ref 80–158)

## 2021-03-12 MED ORDER — OLANZAPINE 7.5 MG PO TABS
15.0000 mg | ORAL_TABLET | Freq: Every day | ORAL | Status: DC
  Administered 2021-03-12 – 2021-03-18 (×7): 15 mg via ORAL
  Filled 2021-03-12 (×9): qty 2

## 2021-03-12 MED ORDER — BENZTROPINE MESYLATE 1 MG PO TABS
1.0000 mg | ORAL_TABLET | Freq: Every day | ORAL | Status: DC
  Administered 2021-03-13 – 2021-03-18 (×6): 1 mg via ORAL
  Filled 2021-03-12 (×7): qty 1

## 2021-03-12 NOTE — [facility] Group Notes (Signed)
[place] Group Notes:  (Nursing/MHT/Case Management/Adjunct)  Date:  03/12/2021  Time:  10:58 PM  Type of Therapy:  Psychoeducational Skills  Participation Level:  Active  Participation Quality:  Appropriate  Affect:  Appropriate  Cognitive:  Alert and Appropriate  Insight:  Appropriate and Good  Engagement in Group:  Supportive  Modes of Intervention:  Problem-solving  Summary of Progress/Problems: Pt. Shared with group how she had a good day with no complaints.  Blenda Mounts [place] 03/12/2021, 10:58 PM

## 2021-03-12 NOTE — Progress Notes (Signed)
Patient is pleasant on approach during assessment, alert and oriented X's 4. Patient denies SI/HI/AVH. AM medication administered with no side effect noted. Patient affect is slightly bright this am, no complaint of pain or discomfort. Patient asked the nurse if she will be going home today. Staff will continue to encourage and support patient to reach goals.

## 2021-03-12 NOTE — Plan of Care (Signed)
°  Problem: Coping: Goal: Ability to verbalize frustrations and anger appropriately will improve Outcome: Progressing Goal: Ability to demonstrate self-control will improve Outcome: Progressing   Problem: Safety: Goal: Periods of time without injury will increase Outcome: Progressing   Problem: Role Relationship: Goal: Ability to communicate needs accurately will improve Outcome: Progressing Goal: Ability to interact with others will improve Outcome: Progressing

## 2021-03-12 NOTE — Progress Notes (Signed)
Pt stated she was feeling better, more like her old self, pt visible on the unit interacting with peers some    03/12/21 2100  Psych Admission Type (Psych Patients Only)  Admission Status Voluntary  Psychosocial Assessment  Patient Complaints Anxiety  Eye Contact Fair  Facial Expression Anxious  Affect Appropriate to circumstance  Speech Logical/coherent  Interaction Cautious;Assertive  Motor Activity Slow  Appearance/Hygiene Unremarkable  Behavior Characteristics Cooperative  Mood Pleasant  Aggressive Behavior  Effect No apparent injury  Thought Process  Coherency WDL  Content WDL  Delusions None reported or observed  Perception WDL  Hallucination None reported or observed  Judgment Poor  Confusion None  Danger to Self  Current suicidal ideation? Passive  Self-Injurious Behavior Some self-injurious ideation observed or expressed.  No lethal plan expressed   Agreement Not to Harm Self Yes  Description of Agreement verbal contract for safety  Danger to Others  Danger to Others None reported or observed

## 2021-03-12 NOTE — Progress Notes (Signed)
°   03/12/21 0811  Psych Admission Type (Psych Patients Only)  Admission Status Voluntary  Psychosocial Assessment  Eye Contact Fair  Facial Expression Other (Comment) (Bright)  Affect Other (Comment) (Slghtly flat)  Speech Soft  Interaction Minimal  Motor Activity Slow  Appearance/Hygiene Unremarkable  Behavior Characteristics Cooperative;Calm  Mood Pleasant;Euthymic  Danger to Self  Current suicidal ideation? Passive  Self-Injurious Behavior No self-injurious ideation or behavior indicators observed or expressed   Agreement Not to Harm Self Yes  Description of Agreement Verbal contract for safety.  Danger to Others  Danger to Others None reported or observed

## 2021-03-12 NOTE — Progress Notes (Signed)
°   03/12/21 0500  Sleep  Number of Hours 7.5

## 2021-03-12 NOTE — [facility] Group Notes (Signed)
Reflection group Today we did an activity on growth mindset and how we can look for opportunities for learning and growth. We discussed how failure is apart of the growth process and had patients give examples of fixed vs growth mindset in their own lives.  Patient participated in growth mindset activity. Patient shared fixed mindset feel like your life is insignificant vs growth mindset realize that you are created for a purpose

## 2021-03-12 NOTE — Progress Notes (Signed)
Ordered on the unit [facility] MD Progress Note  03/12/2021 5:06 PM Janet Welch  MRN:  [id number]  Chief Complaint: paranoia and psychosis  Subjective: Janet Welch reports: "I I didn't sleep well last night"  Daily Notes, 03/12/2021: Pt's chart reviewed, her case discussed with her treatment team. Pt with flat affect and depressed mood, but occasionally smiles during this interaction. There is an improvement in her mood today as compared to yesterday. There is no speech latency today, and speech is clear and coherent. Her attention to personal hygiene and grooming is fair, eye contact is good, pt denies SI/HI/AVH. She denies paranoia or delusional thoughts today, reported being, tired, and took a nap during lunch time, but food was brought back to the unit. Pt reports that her sleep quality last night was poor, and reports appetite as fair.  Pt continues to complain of a heaviness sensation in her b/l arms, states that it is related to the Zyprexa. Pt however appears to be less restless today. Mild tongue tremors observed. She reports having a bowel movement today, states that the Miralax daily is helpful.  V/S currently are WNL. Pt denies being in any physical discomfort. Will continue Zyprexa 15 mg nightly for management of psychosis. Pt agreeable to this. Ceruloplasmin low at 16.2, 24 hr urine collected to check for copper rule out Wilson's disease, and copper level is 78 which is insignificant for copper. Will move Cogentin to 1 mg nightly as it might be causing morning tiredness.Will also move Zyprexa to 8 pm so it to help with morning tiredness.  HPI: Janet Welch is a 46 y.o. female with a history of bipolar d/o, who was initially admitted for inpatient psychiatric hospitalization on 03/04/2021 for management of paranoia and psychosis.   Principal Problem: Bipolar I disorder, current or most recent episode manic, severe with mood-congruent psychotic features ([place]) Diagnosis: Principal Problem:   Bipolar I  disorder, current or most recent episode manic, severe with mood-congruent psychotic features ([place])  Past Psychiatric History: see H&P  Past Medical History:  Past Medical History:  Diagnosis Date   Bipolar 1 disorder ([place])    No pertinent past medical history     Past Surgical History:  Procedure Laterality Date   CESAREAN SECTION N/A 07/07/2013   Procedure: CESAREAN SECTION;  Surgeon: Linda Hedges, DO;  Location: [facility];  Service: Obstetrics;  Laterality: N/A;   LEG SKIN LESION  BIOPSY / EXCISION     scar tissue biopsy     Family History:  Family History  Problem Relation Age of Onset   Thyroid disease Mother    Mental illness Mother    Parkinson's disease Father    Stroke Maternal Grandmother    Stroke Maternal Grandfather    Family Psychiatric  History: see H&P  Social History:  Social History   Substance and Sexual Activity  Alcohol Use Yes   Alcohol/week: 0.0 - 1.0 standard drinks   Comment: socially     Social History   Substance and Sexual Activity  Drug Use Not Currently   Types: Marijuana    Social History   Socioeconomic History   Marital status: Married    Spouse name: Not on file   Number of children: Not on file   Years of education: Not on file   Highest education level: Not on file  Occupational History    Comment: [employer]  Tobacco Use   Smoking status: Never   Smokeless tobacco: Never  Vaping Use   Vaping  Use: Never used  Substance and Sexual Activity   Alcohol use: Yes    Alcohol/week: 0.0 - 1.0 standard drinks    Comment: socially   Drug use: Not Currently    Types: Marijuana   Sexual activity: Yes    Birth control/protection: None  Other Topics Concern   Not on file  Social History Narrative   Not on file   Social Determinants of Health   Financial Resource Strain: Not on file  Food Insecurity: Not on file  Transportation Needs: Not on file  Physical Activity: Not on file  Stress: Not on file  Social Connections: Not  on file   Additional Social History:    Pain Medications: See MAR Prescriptions: See MAR Over the Counter: See MAR History of alcohol / drug use?: No history of alcohol / drug abuse Longest period of sobriety (when/how long): NA  Sleep: Good  Appetite:  Good  Current Medications: Current Facility-Administered Medications  Medication Dose Route Frequency Provider Last Rate Last Admin   acetaminophen (TYLENOL) tablet 650 mg  650 mg Oral Q6H PRN Ethelene Hal, NP       alum & mag hydroxide-simeth (MAALOX/MYLANTA) 200-200-20 MG/5ML suspension 30 mL  30 mL Oral Q4H PRN Ethelene Hal, NP       Derrill Memo ON 03/13/2021] benztropine (COGENTIN) tablet 1 mg  1 mg Oral QHS , , NP       hydrOXYzine (ATARAX) tablet 25 mg  25 mg Oral TID PRN Ethelene Hal, NP   25 mg at 03/11/21 2121   OLANZapine zydis (ZYPREXA) disintegrating tablet 5 mg  5 mg Oral Q8H PRN Ethelene Hal, NP   5 mg at 03/06/21 5009   And   LORazepam (ATIVAN) tablet 1 mg  1 mg Oral PRN Ethelene Hal, NP       And   ziprasidone (GEODON) injection 20 mg  20 mg Intramuscular PRN Ethelene Hal, NP       magnesium hydroxide (MILK OF MAGNESIA) suspension 30 mL  30 mL Oral Daily PRN Ethelene Hal, NP       OLANZapine (ZYPREXA) tablet 15 mg  15 mg Oral QHS , , NP       polyethylene glycol (MIRALAX / GLYCOLAX) packet 17 g  17 g Oral Daily , , NP   17 g at 03/12/21 3818   Lab Results:  No results found for this or any previous visit (from the past 26 hour(s)).   Blood Alcohol level:  Lab Results  Component Value Date   ETH <10 03/05/2021   ETH <10 29/93/7169    Metabolic Disorder Labs: Lab Results  Component Value Date   HGBA1C 4.9 03/05/2021   MPG 93.93 03/05/2021   MPG 99.67 11/22/2020   Lab Results  Component Value Date   PROLACTIN 23.6 (H) 03/07/2021   PROLACTIN 27.6 (H) 11/27/2020   Lab Results  Component Value Date   CHOL 160  03/05/2021   TRIG 54 03/05/2021   HDL 61 03/05/2021   CHOLHDL 2.6 03/05/2021   VLDL 11 03/05/2021   LDLCALC 88 03/05/2021   LDLCALC 79 11/27/2020   Physical Findings: AIMS: Facial and Oral Movements Muscles of Facial Expression: None, normal Lips and Perioral Area: None, normal Jaw: None, normal Tongue: None, normal,Extremity Movements Upper (arms, wrists, hands, fingers): None, normal Lower (legs, knees, ankles, toes): None, normal, Trunk Movements Neck, shoulders, hips: None, normal, Overall Severity Severity of abnormal movements (highest score from questions above): None,  normal Incapacitation due to abnormal movements: None, normal Patient's awareness of abnormal movements (rate only patient's report): No Awareness, Dental Status Current problems with teeth and/or dentures?: No Does patient usually wear dentures?: No   Musculoskeletal: Strength & Muscle Tone: within normal limits Gait & Station: normal, steady Patient leans: N/A  Psychiatric Specialty Exam:  Presentation  General Appearance: Appropriate for Environment; Fairly Groomed  Eye Contact:Good   Speech:WNL  Speech Volume:Normal  Mood and Affect  Mood: WNL  Affect:WNL  Thought Process  Thought Processes:Clear coherent speech  Orientation:oriented to self, month, year and city but not situation  Thought Content: Does not appear paranoid today, denies paranoia/delusional thoughts.    History of Schizophrenia/Schizoaffective disorder:No  Duration of Psychotic Symptoms:Greater than six months  Hallucinations:Denied  Ideas of Reference:Denied  Suicidal Thoughts:Denied  Homicidal Thoughts:Denied  Sensorium  Memory:Poor  Judgment:Fair  Insight:Shallow  Executive Functions  Concentration: Fair  Attention Span: [place]  Psychomotor Activity  Psychomotor Activity:Mild tongue tremors-On cogentin  Assets  Assets:Communication Skills;  Housing; Social Support  Sleep  Reports poor sleep quality last night  Physical Exam Vitals reviewed.  Constitutional:      Appearance: Normal appearance.  HENT:     Head: Normocephalic.     Nose: Nose normal. No congestion or rhinorrhea.  Eyes:     Pupils: Pupils are equal, round, and reactive to light.  Pulmonary:     Effort: Pulmonary effort is normal.  Musculoskeletal:        General: Normal range of motion.  Neurological:     General: No focal deficit present.     Mental Status: She is alert.   Review of Systems  Constitutional: Negative.  Negative for fever.  HENT: Negative.    Respiratory: Negative.  Negative for cough and shortness of breath.   Cardiovascular:  Negative for chest pain and palpitations.  Gastrointestinal:  Negative for abdominal pain, constipation, diarrhea, nausea and vomiting.  Neurological:  Negative for dizziness, focal weakness, loss of consciousness and headaches.  Psychiatric/Behavioral:  Positive for depression. Negative for hallucinations, substance abuse and suicidal ideas.   Blood pressure 103/63, pulse (!) 57, temperature 98.1 F (36.7 C), temperature source Oral, resp. rate 16, height 5' 7" (1.702 m), weight 77.8 kg, SpO2 99 %. Body mass index is 26.88 kg/m.  Treatment Plan Summary: Assessment:  Given report of husband that she had not been sleeping and had been acting erratically prior to admission, and given her past history of bipolar d/o, this is a presumed exacerbation of her bipolar illness. 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   Diagnoses / Active Problems: Bipolar I MRE manic with psychotic features by hx (r/o schizoaffective d/o bipolar type, r/o psychotic d/o due to general medical condition) UTI Hypokalemia Low Ceruloplasmin  PLAN: Safety and Monitoring:  -- Voluntary admission  to inpatient psychiatric unit for safety, stabilization and treatment - if patient attempts to leave will need IVC  -- Daily contact with patient to assess and evaluate symptoms and progress in treatment  -- Patient's case to be discussed in multi-disciplinary team meeting  -- Observation Level : q15 minute checks  -- Vital signs:  q12 hours  -- Precautions: suicide  2. Psychiatric Diagnoses and Treatment:   Bipolar I MRE manic with psychotic features by hx (r/o schizoaffective d/o bipolar type, r/o psychotic d/o due to  general medical condition)  -- Stopped Abilify due to abrupt decompensation with med change and report of akathisia on medication  -- Increase Zyprexa to 15 mg qhs   -- Metabolic profile and EKG monitoring obtained while on an atypical antipsychotic (Lipid panel WNL, A1c 4.9, QTC 422m)  -- Prolactin level 23.6 (was 27.6 3 months ago and 31.8 3 years ago) - will need f/u with PCP after discharge  -- Continue Cogentin 0.555mbid with AIMS monitoring  -- Due to oversedation, discontinued Trazodone PRN  -- Psychosis w/u: Head CT noncontrast negative;  ESR 1,  B12 264, HIV nonreactive, TSH 1.407, alcohol less than 10, CMP within normal limits other than a potassium of 3.0 on admission. Repeat CMP on 2/16 with Potassium at 4.1 (WNL). Glucose of 113, AST 15 and ALT 15, WBC 6.2, UDS negative, RPR non reactive, ceruloplasmin low at 16.2, heavy metal pending, and ANA negative.  -- Encouraged patient to participate in unit milieu and in scheduled group therapies      3. Medical Issues Being Addressed:   UTI  -- Continue Macrobid 10015mid for 7 days - susceptible to Macrobid per sensitivity report  -- Preliminary culture results show 60,000 CFU E coli and >100,000 CFU streptococcus agalactiae   Hypokalemia - resolved  - Patient given K+ replacement and repeat BMP shows K+ 4.1   Constipation  -- Discontinue colace 100m17mily              --Continue to push po fluids  -- Continue  Miralax 17g daily added   Low Ceruloplasmin  - Checking 24 hour urine copper and serum copper levels as part of wilson's disease w/u  4. Discharge Planning:   -- Social work and case management to assist with discharge planning and identification of hospital follow-up needs prior to discharge  -- Discharge Concerns: Need to establish a safety plan; Medication compliance and effectiveness  -- Discharge Goals: Return home with outpatient referrals for mental health follow-up including medication management/psychotherapy  DoriNicholes Rough 03/12/2021, 5:06 PM Patient ID: DebrLes Poumale   DOB: [id number] y[birth year].   MRN: [id number]

## 2021-03-12 NOTE — Group Note (Signed)
Recreation Therapy Group Note   Group Topic:Animal Assisted Therapy   Group Date: 03/12/2021 Start Time: 3818 End Time: 1500 Facilitators: Victorino Sparrow, LRT,CTRS Location: [address]   AAA/T Program Assumption of Risk Form signed by Patient/ or Parent Legal Guardian YES  Patient is free of allergies or severe asthma  YES  Patient reports no fear of animals YES  Patient reports no history of cruelty to animals YES  Patient understands their participation is voluntary YES  Patient washes hands before animal contact YES  Patient washes hands after animal contact YES   Group Description: Patients provided opportunity to interact with trained and credentialed [employer] Therapy dog and the community volunteer/dog handler. Patients practiced appropriate animal interaction and were educated on dog safety outside of the hospital in common community settings. Patients were allowed to use dog toys and other items to practice commands, engage the dog in play, and/or complete routine aspects of animal care.   Education: Contractor, Pensions consultant, Communication & Social Skills    Affect/Mood: Appropriate   Participation Level: Engaged    Clinical Observations/Individualized Feedback:  Pt attended and participated.  Pt asked questions of the volunteer about therapy dog's training and shared stories of her pets at home. Pt was appropriate during group session.    Plan: Continue to engage patient in RT group sessions 2-3x/week.   Victorino Sparrow, Glennis Brink 03/12/2021 3:53 PM

## 2021-03-12 NOTE — [facility] Group Notes (Signed)
Orientation Group  The focus of this group is to educate the patient on the purpose and policies of crisis stabilization and provide a format to answer questions about their admission.  The group details unit policies and expectations of patients while admitted. Patient participated in orientation group.

## 2021-03-13 NOTE — Group Note (Signed)
Recreation Therapy Group Note   Group Topic:Stress Management  Group Date: 03/13/2021 Start Time: 0930 End Time: 0950 Facilitators: Victorino Sparrow, LRT,CTRS Location: [address]   Goal Area(s) Addresses:  Patient will actively participate in stress management techniques presented during session.  Patient will successfully identify benefit of practicing stress management post d/c.   Group Description: Guided Imagery. LRT provided education, instruction, and demonstration on practice of visualization via guided imagery. Patient was asked to participate in the technique introduced during session. LRT debriefed including topics of mindfulness, stress management and specific scenarios each patient could use these techniques. Patients were given suggestions of ways to access scripts post d/c and encouraged to explore Youtube and other apps available on smartphones, tablets, and computers.  Affect/Mood: N/A   Participation Level: N/A    Clinical Observations/Individualized Feedback: Unable to do group session due to morning goals group running over time.    Plan: Continue to engage patient in RT group sessions 2-3x/week.   Victorino Sparrow, LRT,CTRS 03/13/2021 1:14 PM

## 2021-03-13 NOTE — Progress Notes (Signed)
Patient did not attend morning orientation group.  

## 2021-03-13 NOTE — Group Note (Signed)
[facility] LCSW Group Therapy Note  Date/Time: 03/13/2021 @ 1pm  Type of Therapy and Topic:  Group Therapy:  Strengths and Qualities  Participation Level:  Active  Description of Group:    In this group patients will be asked to explore and define the terms strength ans qualities.  Patients will be guided to discuss their thoughts, feelings, and behaviors as to where strengths and qualities originate. Participants will then list some of their strengths and qualities related to each subject topic. This group will be process-oriented, with patients participating in exploration of their own experiences as well as giving and receiving support and challenge from other group members.  Therapeutic Goals: Patient will identify specific strengths related to their personal life. Patient will identify feelings, thoughts, and beliefs about strengths and qualities. Patient will identify ways their strengths have been used. . Patient will identify situations where they have helped others or made someone else happy. .  Summary of Patient Progress Patient participated in group on today. Patient participated minimally.  Patient was attentive to others but did not disclose strengths or protective factors that have helped her get through life.     Therapeutic Modalities:   Cognitive Behavioral Therapy Solution Focused Therapy Motivational Interviewing Brief Therapy   Philmore Lepore, LCSW, [place] Social Worker  [facility]

## 2021-03-13 NOTE — Progress Notes (Signed)
Ordered on the unit [facility] MD Progress Note  03/13/2021 4:06 PM [name]  MRN:  [id number]  Chief Complaint: paranoia and psychosis  Subjective: Janet Welch reports: "Slick gave me ear plugs and I was able to sleep. But I don't feel like I'm a hundred percent yet.""  Daily Notes, 03/13/2021: Pt's chart reviewed, her case discussed with her treatment team. Pt with flat affect and depressed mood, but occasionally smiles during this interaction. There continues to be an improvement in her mood on a day to day basis as compared to during admission. There is come speech latency today,  but thought contents are logical. Her attention to personal hygiene and grooming is fair, eye contact is good, pt denies SI/HI/AVH. She denies paranoia or delusional thoughts.She continues to report  being tired.   Pt with mild tongue & arm tremors. She is currently on Cogentin $RemoveBef'1mg'XyCCgUQAqn$  nightly for EPS. AIMS: 2. Pt denies being in any physical discomfort. Will continue Zyprexa 15 mg nightly for management of psychosis. Will continue Cogentin 1 mg nightly. The possibility of transitioning to a long acting injectable medication discussed with patient who stated that she wanted to call her husband to discuss it prior to making a decision. She later informed this NP that husband did not answer the phone when she called, and that she would try calling him again later. Ceruloplasmin low at 16.2, 24 hr urine collected to check for copper rule out Wilson's disease, and copper level is 78 which is insignificant for copper.Will also move Zyprexa to 8 pm so it to help with morning tiredness. Macrobid BID for UTI complete.   HPI: Janet Welch is a 46 y.o. female with a history of bipolar d/o, who was initially admitted for inpatient psychiatric hospitalization on 03/04/2021 for management of paranoia and psychosis.   Principal Problem: Bipolar I disorder, current or most recent episode manic, severe with mood-congruent psychotic features  ([place]) Diagnosis: Principal Problem:   Bipolar I disorder, current or most recent episode manic, severe with mood-congruent psychotic features ([place])  Past Psychiatric History: see H&P  Past Medical History:  Past Medical History:  Diagnosis Date   Bipolar 1 disorder ([place])    No pertinent past medical history     Past Surgical History:  Procedure Laterality Date   CESAREAN SECTION N/A 07/07/2013   Procedure: CESAREAN SECTION;  Surgeon: Linda Hedges, DO;  Location: [facility];  Service: Obstetrics;  Laterality: N/A;   LEG SKIN LESION  BIOPSY / EXCISION     scar tissue biopsy     Family History:  Family History  Problem Relation Age of Onset   Thyroid disease Mother    Mental illness Mother    Parkinson's disease Father    Stroke Maternal Grandmother    Stroke Maternal Grandfather    Family Psychiatric  History: see H&P  Social History:  Social History   Substance and Sexual Activity  Alcohol Use Yes   Alcohol/week: 0.0 - 1.0 standard drinks   Comment: socially     Social History   Substance and Sexual Activity  Drug Use Not Currently   Types: Marijuana    Social History   Socioeconomic History   Marital status: Married    Spouse name: Not on file   Number of children: Not on file   Years of education: Not on file   Highest education level: Not on file  Occupational History    Comment: [employer]  Tobacco Use   Smoking status: Never  Smokeless tobacco: Never  Vaping Use   Vaping Use: Never used  Substance and Sexual Activity   Alcohol use: Yes    Alcohol/week: 0.0 - 1.0 standard drinks    Comment: socially   Drug use: Not Currently    Types: Marijuana   Sexual activity: Yes    Birth control/protection: None  Other Topics Concern   Not on file  Social History Narrative   Not on file   Social Determinants of Health   Financial Resource Strain: Not on file  Food Insecurity: Not on file  Transportation Needs: Not on file  Physical Activity: Not on file   Stress: Not on file  Social Connections: Not on file   Additional Social History:    Pain Medications: See MAR Prescriptions: See MAR Over the Counter: See MAR History of alcohol / drug use?: No history of alcohol / drug abuse Longest period of sobriety (when/how long): NA  Sleep: Good  Appetite:  Good  Current Medications: Current Facility-Administered Medications  Medication Dose Route Frequency Provider Last Rate Last Admin   acetaminophen (TYLENOL) tablet 650 mg  650 mg Oral Q6H PRN Ethelene Hal, NP       alum & mag hydroxide-simeth (MAALOX/MYLANTA) 200-200-20 MG/5ML suspension 30 mL  30 mL Oral Q4H PRN Ethelene Hal, NP       benztropine (COGENTIN) tablet 1 mg  1 mg Oral QHS Hosteen Kienast, NP       hydrOXYzine (ATARAX) tablet 25 mg  25 mg Oral TID PRN Ethelene Hal, NP   25 mg at 03/12/21 2139   OLANZapine zydis (ZYPREXA) disintegrating tablet 5 mg  5 mg Oral Q8H PRN Ethelene Hal, NP   5 mg at 03/06/21 2440   And   LORazepam (ATIVAN) tablet 1 mg  1 mg Oral PRN Ethelene Hal, NP       And   ziprasidone (GEODON) injection 20 mg  20 mg Intramuscular PRN Ethelene Hal, NP       magnesium hydroxide (MILK OF MAGNESIA) suspension 30 mL  30 mL Oral Daily PRN Ethelene Hal, NP       OLANZapine (ZYPREXA) tablet 15 mg  15 mg Oral QHS Naomi Fitton, NP   15 mg at 03/12/21 2139   polyethylene glycol (MIRALAX / GLYCOLAX) packet 17 g  17 g Oral Daily Nicholes Rough, NP   17 g at 03/13/21 1027   Lab Results:  No results found for this or any previous visit (from the past 76 hour(s)).   Blood Alcohol level:  Lab Results  Component Value Date   ETH <10 03/05/2021   ETH <10 25/36/6440    Metabolic Disorder Labs: Lab Results  Component Value Date   HGBA1C 4.9 03/05/2021   MPG 93.93 03/05/2021   MPG 99.67 11/22/2020   Lab Results  Component Value Date   PROLACTIN 23.6 (H) 03/07/2021   PROLACTIN 27.6 (H) 11/27/2020    Lab Results  Component Value Date   CHOL 160 03/05/2021   TRIG 54 03/05/2021   HDL 61 03/05/2021   CHOLHDL 2.6 03/05/2021   VLDL 11 03/05/2021   LDLCALC 88 03/05/2021   LDLCALC 79 11/27/2020   Physical Findings: AIMS: Facial and Oral Movements Muscles of Facial Expression: None, normal Lips and Perioral Area: None, normal Jaw: None, normal Tongue: Minimal,Extremity Movements Upper (arms, wrists, hands, fingers): Minimal Lower (legs, knees, ankles, toes): None, normal, Trunk Movements Neck, shoulders, hips: None, normal, Overall Severity Severity of abnormal  movements (highest score from questions above): None, normal Incapacitation due to abnormal movements: None, normal Patient's awareness of abnormal movements (rate only patient's report): No Awareness, Dental Status Current problems with teeth and/or dentures?: No Does patient usually wear dentures?: No   Musculoskeletal: Strength & Muscle Tone: within normal limits Gait & Station: normal, steady Patient leans: N/A  Psychiatric Specialty Exam:  Presentation  General Appearance: Appropriate for Environment  Eye Contact:Good   Speech:WNL  Speech Volume:Normal  Mood and Affect  Mood: WNL  Affect:WNL  Thought Process  Thought Processes:Clear coherent speech  Orientation:oriented to self, month, year and city but not situation  Thought Content: Does not appear paranoid today, denies paranoia/delusional thoughts.    History of Schizophrenia/Schizoaffective disorder:No  Duration of Psychotic Symptoms:Greater than six months  Hallucinations:Denied  Ideas of Reference:Denied  Suicidal Thoughts:Denied  Homicidal Thoughts:Denied  Sensorium  Memory:Poor  Judgment:Fair  Insight:Shallow  Executive Functions  Concentration: Fair  Attention Span: [place]  Psychomotor Activity  Psychomotor Activity:Mild tongue tremors-On cogentin  Assets   Assets:Communication Skills; Social Support; Housing  Sleep  Reports poor sleep quality last night  Physical Exam Vitals reviewed.  Constitutional:      Appearance: Normal appearance.  HENT:     Head: Normocephalic.     Nose: Nose normal. No congestion or rhinorrhea.  Eyes:     Pupils: Pupils are equal, round, and reactive to light.  Pulmonary:     Effort: Pulmonary effort is normal.  Musculoskeletal:        General: Normal range of motion.  Neurological:     General: No focal deficit present.     Mental Status: She is alert.   Review of Systems  Constitutional: Negative.  Negative for fever.  HENT: Negative.    Respiratory: Negative.  Negative for cough and shortness of breath.   Cardiovascular:  Negative for chest pain and palpitations.  Gastrointestinal:  Negative for abdominal pain, constipation, diarrhea, nausea and vomiting.  Neurological:  Negative for dizziness, focal weakness, loss of consciousness and headaches.  Psychiatric/Behavioral:  Positive for depression. Negative for hallucinations, substance abuse and suicidal ideas.   Blood pressure 106/70, pulse (!) 107, temperature 98.1 F (36.7 C), temperature source Oral, resp. rate 16, height $RemoveBe'5\' 7"'PmPYZbYcZ$  (1.702 m), weight 77.8 kg, SpO2 97 %. Body mass index is 26.88 kg/m.  Treatment Plan Summary: Assessment:  Given report of husband that she had not been sleeping and had been acting erratically prior to admission, and given her past history of bipolar d/o, this is a presumed exacerbation of her bipolar illness. 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   Diagnoses / Active Problems: Bipolar I MRE manic with psychotic features by hx (r/o schizoaffective d/o bipolar type, r/o psychotic d/o due to general medical condition) UTI Hypokalemia Low Ceruloplasmin-Copper level not  significant for Wilson's disease.  PLAN: Safety and Monitoring:  -- Voluntary admission to inpatient psychiatric unit for safety, stabilization and treatment - if patient attempts to leave will need IVC  -- Daily contact with patient to assess and evaluate symptoms and progress in treatment  -- Patient's case to be discussed in multi-disciplinary team meeting  -- Observation Level : q15 minute checks  -- Vital signs:  q12 hours  -- Precautions: suicide  2. Psychiatric Diagnoses and Treatment:   Bipolar I MRE manic with psychotic features by  hx (r/o schizoaffective d/o bipolar type, r/o psychotic d/o due to general medical condition)  -- Stopped Abilify due to abrupt decompensation with med change and report of akathisia on medication  -- Continue Zyprexa to 15 mg qhs for Psychosis  -- Metabolic profile and EKG monitoring obtained while on an atypical antipsychotic (Lipid panel WNL, A1c 4.9, QTC 46m)  -- Prolactin level 23.6 (was 27.6 3 months ago and 31.8 3 years ago) - will need f/u with PCP after discharge  -- Continue Cogentin 1 mg daily with AIMS monitoring  -- Due to oversedation, discontinued Trazodone PRN  -- Psychosis w/u: Head CT noncontrast negative;  ESR 1,  B12 264, HIV nonreactive, TSH 1.407, alcohol less than 10, CMP within normal limits other than a potassium of 3.0 on admission. Repeat CMP on 2/16 with Potassium at 4.1 (WNL). Glucose of 113, AST 15 and ALT 15, WBC 6.2, UDS negative, RPR non reactive, ceruloplasmin low at 16.2, heavy metal pending, and ANA negative.  -- Encouraged patient to participate in unit milieu and in scheduled group therapies      3. Medical Issues Being Addressed:   UTI  -- Continue Macrobid 106mbid for 7 days - susceptible to Macrobid per sensitivity report  -- Preliminary culture results show 60,000 CFU E coli and >100,000 CFU streptococcus agalactiae-Pt treated with Macrobid 100 mg BID x 7 days (completed)   Hypokalemia - resolved  - Patient  given K+ replacement and repeat BMP shows K+ 4.1   Constipation  -- Discontinue colace 10048maily              --Continue to push po fluids  -- Continue Miralax 17g daily added   Low Ceruloplasmin  - Checking 24 hour urine copper and serum copper levels as part of wilson's disease w/u-Copper level not significant for Wilson's disease  4. Discharge Planning:   -- Social work and case management to assist with discharge planning and identification of hospital follow-up needs prior to discharge  -- Discharge Concerns: Need to establish a safety plan; Medication compliance and effectiveness  -- Discharge Goals: Return home with outpatient referrals for mental health follow-up including medication management/psychotherapy  DorNicholes RoughP 03/13/2021, 4:06 PM Patient ID: DebLes Pouemale   DOB: [birth year] 33o.   MRN: [id number]

## 2021-03-13 NOTE — Progress Notes (Signed)
°   03/13/21 0607  Vital Signs  Pulse Rate (!) 107  Pulse Rate Source Monitor  BP 106/70  BP Location Right Arm  BP Method Automatic  Patient Position (if appropriate) Standing  Oxygen Therapy  SpO2 97 %  Sleep  Number of Hours 7.25

## 2021-03-13 NOTE — Progress Notes (Signed)
Psychoeducational Group Note  Date:  03/13/2021 Time:  2122  Group Topic/Focus:  Wrap-Up Group:   The focus of this group is to help patients review their daily goal of treatment and discuss progress on daily workbooks.  Participation Level: Did Not Attend  Participation Quality:  Not Applicable  Affect:  Not Applicable  Cognitive:  Not Applicable  Insight:  Not Applicable  Engagement in Group: Not Applicable  Additional Comments:  The patient did not attend group this evening.   [name] S 03/13/2021, 9:22 PM

## 2021-03-14 LAB — COPPER, URINE - RANDOM OR 24 HOUR
Copper / Creatinine Ratio: 14 ug/g creat (ref 0–49)
Copper, 24H Ur: 12 ug/24 hr (ref 3–35)
Copper, Ur: 5 ug/L
Creatinine(Crt),U: 0.35 g/L (ref 0.30–3.00)
Total Volume: 2475

## 2021-03-14 NOTE — Progress Notes (Signed)
Pt denies SI/HI/AVH and verbally agrees to approach staff if these become apparent or before harming themselves/others. Rates depression 2/10. Rates anxiety 3/10. Rates pain 0/10. Scheduled medications administered to pt, per MD orders. RN provided support and encouragement to pt. Q15 min safety checks implemented and continued. Pt safe on the unit. RN will continue to monitor and intervene as needed.   03/14/21 0751  Psych Admission Type (Psych Patients Only)  Admission Status Voluntary  Psychosocial Assessment  Patient Complaints Anxiety;Depression  Eye Contact Fair  Facial Expression Flat;Anxious  Affect Appropriate to circumstance;Flat  Speech Logical/coherent  Interaction Forwards little;Minimal  Motor Activity Other (Comment) (WDL)  Appearance/Hygiene Unremarkable  Behavior Characteristics Cooperative;Appropriate to situation;Calm  Mood Pleasant  Thought Process  Coherency WDL  Content WDL  Delusions None reported or observed  Perception WDL  Hallucination None reported or observed  Judgment Poor  Confusion None  Danger to Self  Current suicidal ideation? Denies  Danger to Others  Danger to Others None reported or observed

## 2021-03-14 NOTE — Group Note (Signed)
Date:  03/14/2021 Time:  4:25 PM  Group Topic/Focus:  Wellness Toolbox:   The focus of this group is to discuss various aspects of wellness, balancing those aspects and exploring ways to increase the ability to experience wellness.  Patients will create a wellness toolbox for use upon discharge.    Participation Level:  Minimal  Participation Quality:  Appropriate  Affect:  Appropriate  Cognitive:  Appropriate  Insight: Appropriate  Engagement in Group:  Improving  Modes of Intervention:  Discussion  Additional Comments:    Garvin Fila 03/14/2021, 4:25 PM

## 2021-03-14 NOTE — Progress Notes (Signed)
Patient safe on unit, denies SI,AVH and HI. Patient attended and participated in group. Patient is compliant with medications and denies pain. Will continue to monitor and maintain safety

## 2021-03-14 NOTE — Progress Notes (Signed)
Adult Psychoeducational Group Note  Date:  03/14/2021 Time:  9:42 PM  Group Topic/Focus:  Wrap-Up Group:   The focus of this group is to help patients review their daily goal of treatment and discuss progress on daily workbooks.  Participation Level:  Active  Participation Quality:  Appropriate  Affect:  Appropriate  Cognitive:  Appropriate  Insight: Appropriate  Engagement in Group:  Engaged  Modes of Intervention:  Discussion  Additional Comments:  exercise expression color green. Her day was 8. Her goal for today to help with discharge. Yes she achieved her goal.her coping skills staying buzy.   Lenice Llamas Long 03/14/2021, 9:42 PM

## 2021-03-14 NOTE — Progress Notes (Signed)
D:  Janet Welch was in the day room much of the evening.  She reported her day was an 8/10 (10 the best).  She stated that she feels much better today and is looking forward to being d/c'd early next week.  Affect brighter and no thought blocking noted tonight.  She denied SI/HI or AVH.  She took her hs medications without difficulty.  She denied any pain or discomfort and appeared to be in no physical distress.  No prn's requested at this time.    A:  1:1 with RN for support and encouragement.  Medications given as ordered.  Q 15 minute checks maintained for safety.  Encouraged participation in group and unit activities.   R:  She is currently resting with her eyes closed and appears to be asleep.  She remains safe on the unit.

## 2021-03-14 NOTE — Progress Notes (Signed)
[facility] MD Progress Note  03/14/2021 4:02 PM Janet Welch  MRN:  [id number]  Subjective:  Janet Welch states: "I am a little tired today, but I don't know. I feel good."  Daily Notes, 03/14/2021: Pt's chart reviewed, case discussed with the treatment team. Pt with flat affect and depressed mood, attention to personal hygiene and grooming is fair, eye contact is good, speech is clear & coherent. Thought contents are organized and logical, and pt currently denies SI/HI/AVH or paranoia. There is no evidence of delusional thoughts. There is marked improvement in patient's symptoms as compared to during admission to the unit. Pt is able to carry on a conversation with no thought blocking, and there is no evidence of paranoia or delusional thinking. Pt is visible in the day room interacting with peers, and participating in activities. She has more insight into her present condition, and is able to verbalize her concerns about her insurance paying for a long acting injectable medication, should she choose this route of treatment. Pt has been educated that a message will be sent to the pharmacist to look into this. Meanwhile, pt has asked for literature on Invega LAI, and this will be provided to her. Pt is observed to have mild tremors of her tongue and fingers. AIMS:2. She is on Cogentin 1 mg nightly. Will continue Zyprexa 15 mg nightly for management of psychosis. Will continue other medications as listed below.  Pt is reporting a fair sleep quality nightly and is reporting a good appetite. Pt's assigned social worker has been informed to start the process of scheduling a family meeting with pt's husband so that she can possibly be discharged home next Tuesday (2/28), given that all other resources are in place to support her outside of the hospital.  HPI: Janet Burgen. Welch is a 46 y.o female with a history of bipolar disorder who was walked voluntarily to this [facility] by her husband on 03/04/21 for hallucinations and bizarre  behaviors at home. This is pt's 3rd admission to this [facility]. Pt was admitted for treatment and stabilization of her mood.  Principal Problem: Bipolar I disorder, current or most recent episode manic, severe with mood-congruent psychotic features ([place]) Diagnosis: Principal Problem:   Bipolar I disorder, current or most recent episode manic, severe with mood-congruent psychotic features ([place])  Total Time spent with patient: 20 minutes  Past Psychiatric History: as above  Past Medical History:  Past Medical History:  Diagnosis Date   Bipolar 1 disorder ([place])    No pertinent past medical history     Past Surgical History:  Procedure Laterality Date   CESAREAN SECTION N/A 07/07/2013   Procedure: CESAREAN SECTION;  Surgeon: Linda Hedges, DO;  Location: [facility];  Service: Obstetrics;  Laterality: N/A;   LEG SKIN LESION  BIOPSY / EXCISION     scar tissue biopsy     Family History:  Family History  Problem Relation Age of Onset   Thyroid disease Mother    Mental illness Mother    Parkinson's disease Father    Stroke Maternal Grandmother    Stroke Maternal Grandfather    Family Psychiatric  History: as above Social History:  Social History   Substance and Sexual Activity  Alcohol Use Yes   Alcohol/week: 0.0 - 1.0 standard drinks   Comment: socially     Social History   Substance and Sexual Activity  Drug Use Not Currently   Types: Marijuana    Social History   Socioeconomic History  Marital status: Married    Spouse name: Not on file   Number of children: Not on file   Years of education: Not on file   Highest education level: Not on file  Occupational History    Comment: [employer]  Tobacco Use   Smoking status: Never   Smokeless tobacco: Never  Vaping Use   Vaping Use: Never used  Substance and Sexual Activity   Alcohol use: Yes    Alcohol/week: 0.0 - 1.0 standard drinks    Comment: socially   Drug use: Not Currently    Types: Marijuana   Sexual activity: Yes     Birth control/protection: None  Other Topics Concern   Not on file  Social History Narrative   Not on file   Social Determinants of Health   Financial Resource Strain: Not on file  Food Insecurity: Not on file  Transportation Needs: Not on file  Physical Activity: Not on file  Stress: Not on file  Social Connections: Not on file   Additional Social History:    Pain Medications: See MAR Prescriptions: See MAR Over the Counter: See MAR History of alcohol / drug use?: No history of alcohol / drug abuse Longest period of sobriety (when/how long): NA     Sleep: Fair  Appetite:  Good  Current Medications: Current Facility-Administered Medications  Medication Dose Route Frequency Provider Last Rate Last Admin   acetaminophen (TYLENOL) tablet 650 mg  650 mg Oral Q6H PRN Ethelene Hal, NP       alum & mag hydroxide-simeth (MAALOX/MYLANTA) 200-200-20 MG/5ML suspension 30 mL  30 mL Oral Q4H PRN Ethelene Hal, NP       benztropine (COGENTIN) tablet 1 mg  1 mg Oral QHS Colie Josten, NP   1 mg at 03/13/21 2054   hydrOXYzine (ATARAX) tablet 25 mg  25 mg Oral TID PRN Ethelene Hal, NP   25 mg at 03/13/21 2054   OLANZapine zydis (ZYPREXA) disintegrating tablet 5 mg  5 mg Oral Q8H PRN Ethelene Hal, NP   5 mg at 03/06/21 4315   And   LORazepam (ATIVAN) tablet 1 mg  1 mg Oral PRN Ethelene Hal, NP       And   ziprasidone (GEODON) injection 20 mg  20 mg Intramuscular PRN Ethelene Hal, NP       magnesium hydroxide (MILK OF MAGNESIA) suspension 30 mL  30 mL Oral Daily PRN Ethelene Hal, NP       OLANZapine (ZYPREXA) tablet 15 mg  15 mg Oral QHS Kallee Nam, NP   15 mg at 03/13/21 2057   polyethylene glycol (MIRALAX / GLYCOLAX) packet 17 g  17 g Oral Daily Nicholes Rough, NP   17 g at 03/13/21 4008    Lab Results: No results found for this or any previous visit (from the past 14 hour(s)).  Blood Alcohol level:  Lab Results   Component Value Date   ETH <10 03/05/2021   ETH <10 67/61/9509    Metabolic Disorder Labs: Lab Results  Component Value Date   HGBA1C 4.9 03/05/2021   MPG 93.93 03/05/2021   MPG 99.67 11/22/2020   Lab Results  Component Value Date   PROLACTIN 23.6 (H) 03/07/2021   PROLACTIN 27.6 (H) 11/27/2020   Lab Results  Component Value Date   CHOL 160 03/05/2021   TRIG 54 03/05/2021   HDL 61 03/05/2021   CHOLHDL 2.6 03/05/2021   VLDL 11 03/05/2021   LDLCALC 88  03/05/2021   [place] 79 11/27/2020    Physical Findings: AIMS: Facial and Oral Movements Muscles of Facial Expression: None, normal Lips and Perioral Area: None, normal Jaw: None, normal Tongue: Minimal,Extremity Movements Upper (arms, wrists, hands, fingers): Minimal Lower (legs, knees, ankles, toes): None, normal, Trunk Movements Neck, shoulders, hips: None, normal, Overall Severity Severity of abnormal movements (highest score from questions above): None, normal Incapacitation due to abnormal movements: None, normal Patient's awareness of abnormal movements (rate only patient's report): No Awareness, Dental Status Current problems with teeth and/or dentures?: No Does patient usually wear dentures?: No  CIWA: n/a COWS:  n/a AIMS: 2  Musculoskeletal: Strength & Muscle Tone: within normal limits Gait & Station: normal Patient leans: N/A  Psychiatric Specialty Exam:  Presentation  General Appearance: Appropriate for Environment; Fairly Groomed  Eye Contact:Good  Speech:Clear and Coherent  Speech Volume:Normal  Handedness:Right  Mood and Affect  Mood:Depressed  Affect:Congruent  Thought Process  Thought Processes:Coherent  Descriptions of Associations:Intact  Orientation:Full (Time, Place and Person)  Thought Content:Logical  History of Schizophrenia/Schizoaffective disorder:No  Duration of Psychotic Symptoms:Greater than six months  Hallucinations:Hallucinations: None  Ideas of  Reference:None  Suicidal Thoughts:Suicidal Thoughts: No  Homicidal Thoughts:Homicidal Thoughts: No  Sensorium  Memory:Immediate Good; Recent Fair  Judgment:Fair  Insight:Fair  Executive Functions  Concentration:Fair  Attention Span:Fair  [place]  Psychomotor Activity  Psychomotor Activity:Psychomotor Activity: Normal  Assets  Assets:Communication Skills; Housing; Social Support  Sleep  Sleep:Sleep: Fair  Physical Exam: Physical Exam Constitutional:      General: She is not in acute distress. HENT:     Head: Normocephalic.     Right Ear: There is no impacted cerumen.     Nose: Nose normal. No congestion.     Mouth/Throat:     Pharynx: No oropharyngeal exudate.  Eyes:     Pupils: Pupils are equal, round, and reactive to light.  Pulmonary:     Effort: Pulmonary effort is normal. No respiratory distress.  Musculoskeletal:        General: Normal range of motion.     Cervical back: Normal range of motion. No rigidity.  Neurological:     Mental Status: She is alert and oriented to person, place, and time.     Cranial Nerves: No cranial nerve deficit.     Sensory: No sensory deficit.   Review of Systems  Constitutional: Negative.  Negative for chills and fever.  HENT: Negative.  Negative for sore throat and tinnitus.   Eyes: Negative.   Respiratory: Negative.  Negative for cough, shortness of breath and stridor.   Cardiovascular: Negative.  Negative for chest pain.  Gastrointestinal: Negative.  Negative for abdominal pain, constipation, heartburn, nausea and vomiting.  Genitourinary: Negative.   Musculoskeletal: Negative.   Skin: Negative.   Neurological: Negative.  Negative for dizziness and headaches.  Endo/Heme/Allergies:        Allergies: Bee Venom  Psychiatric/Behavioral:  Positive for depression (improving with medications). Negative for suicidal ideas. The patient has insomnia (improving with medications).  The patient is not nervous/anxious.   Blood pressure 113/69, pulse (!) 116, temperature 98.6 F (37 C), temperature source Oral, resp. rate 16, height _0  (1.702 m), weight 77.8 kg, SpO2 96 %. Body mass index is 26.88 kg/m.  Diagnoses / Active Problems: Bipolar I MRE manic with psychotic features by hx (r/o schizoaffective d/o bipolar type, r/o psychotic d/o due to general medical condition) UTI Hypokalemia Low Ceruloplasmin-Copper level not significant for Wilson's disease.  PLAN: Safety and Monitoring:             -- Voluntary admission to inpatient psychiatric unit for safety, stabilization and treatment - if patient attempts to leave will need IVC             -- Daily contact with patient to assess and evaluate symptoms and progress in treatment             -- Patient's case to be discussed in multi-disciplinary team meeting             -- Observation Level : q15 minute checks             -- Vital signs:  q12 hours             -- Precautions: suicide   2. Psychiatric Diagnoses and Treatment:              Bipolar I MRE manic with psychotic features by hx (r/o schizoaffective d/o bipolar type, r/o psychotic d/o due to general medical condition)             -- Stopped Abilify due to abrupt decompensation with med change and report of akathisia on medication             -- Continue Zyprexa to 15 mg qhs for Psychosis             -- Metabolic profile and EKG monitoring obtained while on an atypical antipsychotic (Lipid panel WNL, A1c 4.9, QTC 425m)             -- Prolactin level 23.6 (was 27.6 3 months ago and 31.8 3 years ago) - will need f/u with PCP after discharge             -- Continue Cogentin 1 mg daily with AIMS monitoring             -- Due to oversedation, discontinued Trazodone PRN             -- Psychosis w/u: Head CT noncontrast negative;  ESR 1,  B12 264, HIV nonreactive, TSH 1.407, alcohol less than 10, CMP within normal limits other than a potassium of 3.0 on admission.  Repeat CMP on 2/16 with Potassium at 4.1 (WNL). Glucose of 113, AST 15 and ALT 15, WBC 6.2, UDS negative, RPR non reactive, ceruloplasmin low at 16.2, heavy metal pending, and ANA negative.             -- Encouraged patient to participate in unit milieu and in scheduled group therapies                                  3. Medical Issues Being Addressed:              UTI             -- Macrobid 1012mbid for 7 days -completed             -- Preliminary culture results show 60,000 CFU E coli and >100,000 CFU streptococcus agalactiae-Pt treated with Macrobid 100 mg BID x 7 days (completed)               Hypokalemia - resolved             - Patient given K+ replacement and repeat BMP shows K+ 4.1  Constipation             -- Discontinued colace 175m daily              --Continue to push po fluids             -- Continue Miralax 17g daily added               Low Ceruloplasmin             - Checking 24 hour urine copper and serum copper levels as part of wilson's disease w/u-Copper level not significant for Wilson's disease   4. Discharge Planning:              -- Social work and case management to assist with discharge planning and identification of hospital follow-up needs prior to discharge             -- Discharge Concerns: Need to establish a safety plan; Medication compliance and effectiveness             -- Discharge Goals: Return home with outpatient referrals for mental health follow-up including medication management/psychotherapy    DNicholes Rough NP 03/14/2021, 4:02 PM

## 2021-03-15 ENCOUNTER — Encounter (HOSPITAL_COMMUNITY): Payer: Self-pay

## 2021-03-15 NOTE — [facility] Group Notes (Signed)
Adult Psychoeducational Group Note  Date:  03/15/2021 Time:  3:02 PM  Group Topic/Focus:  Wellness Toolbox:   The focus of this group is to discuss various aspects of wellness, balancing those aspects and exploring ways to increase the ability to experience wellness.  Patients will create a wellness toolbox for use upon discharge.  Participation Level:  Active  Participation Quality:  Attentive  Affect:  Appropriate  Cognitive:  Appropriate  Insight: Appropriate  Engagement in Group:  Engaged  Modes of Intervention:  Activity  Additional Comments:  Patient attended and participated in the relaxation group activity.  Annie Sable 03/15/2021, 3:02 PM

## 2021-03-15 NOTE — Progress Notes (Signed)
[facility] MD Progress Note  03/15/2021 1:27 PM Janet Welch  MRN:  [id number]  Subjective: Janet Welch reports, "I'm doing okay today. I was told that there will be a family meeting on Monday with me, my husband & you all to discuss my progress I guess. After that, I will kind of know when I will be discharged from here. I think I'm doing better". Daily Notes: 03/14/2021: Janet Welch is seen, chart reviewed. The chart findings discussed with the treatment team. She is sitting on her bed filling out her inventory sheet. She presents with a flat affect/depressed mood, attention to personal hygiene/grooming is fair, eye contact is good, speech is clear & coherent. Thought contents are organized and logical, and pt currently denies SI/HI/AVH or paranoia. There is no evidence of delusional thoughts. There is marked improvement in patient's symptoms as compared to during admission to the unit. Pt is able to carry on a conversation with no thought blocking, and there is no evidence of paranoia or delusional thinking. Pt is visible in the day room interacting with peers & participating in activities. She says she feels she is doing better. Reports a scheduled family meeting with her husband/her treatment team come next Monday 03-19-21. She hospes that after that family meeting, she will be able to know when her discharge date will be. She denies any side effects from her medications. Will continue other medications as listed below.  HPI: Janet Welch is a 46 y.o female with a history of bipolar disorder who was walked voluntarily to this [facility] by her husband on 03/04/21 for hallucinations and bizarre behaviors at home. This is pt's 3rd admission to this [facility]. Pt was admitted for treatment and stabilization of her mood.  Principal Problem: Bipolar I disorder, current or most recent episode manic, severe with mood-congruent psychotic features ([place])  Diagnosis: Principal Problem:   Bipolar I disorder, current or most recent episode  manic, severe with mood-congruent psychotic features ([place])  Total Time spent with patient: 20 minutes  Past Psychiatric History: as above  Past Medical History:  Past Medical History:  Diagnosis Date   Bipolar 1 disorder ([place])    No pertinent past medical history     Past Surgical History:  Procedure Laterality Date   CESAREAN SECTION N/A 07/07/2013   Procedure: CESAREAN SECTION;  Surgeon: Linda Hedges, DO;  Location: [facility];  Service: Obstetrics;  Laterality: N/A;   LEG SKIN LESION  BIOPSY / EXCISION     scar tissue biopsy     Family History:  Family History  Problem Relation Age of Onset   Thyroid disease Mother    Mental illness Mother    Parkinson's disease Father    Stroke Maternal Grandmother    Stroke Maternal Grandfather    Family Psychiatric  History: as above Social History:  Social History   Substance and Sexual Activity  Alcohol Use Yes   Alcohol/week: 0.0 - 1.0 standard drinks   Comment: socially     Social History   Substance and Sexual Activity  Drug Use Not Currently   Types: Marijuana    Social History   Socioeconomic History   Marital status: Married    Spouse name: Not on file   Number of children: Not on file   Years of education: Not on file   Highest education level: Not on file  Occupational History    Comment: [employer]  Tobacco Use   Smoking status: Never   Smokeless tobacco: Never  Vaping Use  Vaping Use: Never used  Substance and Sexual Activity   Alcohol use: Yes    Alcohol/week: 0.0 - 1.0 standard drinks    Comment: socially   Drug use: Not Currently    Types: Marijuana   Sexual activity: Yes    Birth control/protection: None  Other Topics Concern   Not on file  Social History Narrative   Not on file   Social Determinants of Health   Financial Resource Strain: Not on file  Food Insecurity: Not on file  Transportation Needs: Not on file  Physical Activity: Not on file  Stress: Not on file  Social Connections:  Not on file   Additional Social History:    Pain Medications: See MAR Prescriptions: See MAR Over the Counter: See MAR History of alcohol / drug use?: No history of alcohol / drug abuse Longest period of sobriety (when/how long): NA     Sleep: Fair  Appetite:  Good  Current Medications: Current Facility-Administered Medications  Medication Dose Route Frequency Provider Last Rate Last Admin   acetaminophen (TYLENOL) tablet 650 mg  650 mg Oral Q6H PRN Ethelene Hal, Janet Welch       alum & mag hydroxide-simeth (MAALOX/MYLANTA) 200-200-20 MG/5ML suspension 30 mL  30 mL Oral Q4H PRN Ethelene Hal, Janet Welch       benztropine (COGENTIN) tablet 1 mg  1 mg Oral QHS Nkwenti, Doris, Janet Welch   1 mg at 03/14/21 2151   hydrOXYzine (ATARAX) tablet 25 mg  25 mg Oral TID PRN Ethelene Hal, Janet Welch   25 mg at 03/13/21 2054   OLANZapine zydis (ZYPREXA) disintegrating tablet 5 mg  5 mg Oral Q8H PRN Ethelene Hal, Janet Welch   5 mg at 03/06/21 3254   And   LORazepam (ATIVAN) tablet 1 mg  1 mg Oral PRN Ethelene Hal, Janet Welch       And   ziprasidone (GEODON) injection 20 mg  20 mg Intramuscular PRN Ethelene Hal, Janet Welch       magnesium hydroxide (MILK OF MAGNESIA) suspension 30 mL  30 mL Oral Daily PRN Ethelene Hal, Janet Welch       OLANZapine (ZYPREXA) tablet 15 mg  15 mg Oral QHS Nkwenti, Doris, Janet Welch   15 mg at 03/14/21 2150   polyethylene glycol (MIRALAX / GLYCOLAX) packet 17 g  17 g Oral Daily Nicholes Rough, Janet Welch   17 g at 03/13/21 9826    Lab Results: No results found for this or any previous visit (from the past 55 hour(s)).  Blood Alcohol level:  Lab Results  Component Value Date   ETH <10 03/05/2021   ETH <10 41/58/3094    Metabolic Disorder Labs: Lab Results  Component Value Date   HGBA1C 4.9 03/05/2021   MPG 93.93 03/05/2021   MPG 99.67 11/22/2020   Lab Results  Component Value Date   PROLACTIN 23.6 (H) 03/07/2021   PROLACTIN 27.6 (H) 11/27/2020   Lab Results  Component  Value Date   CHOL 160 03/05/2021   TRIG 54 03/05/2021   HDL 61 03/05/2021   CHOLHDL 2.6 03/05/2021   VLDL 11 03/05/2021   LDLCALC 88 03/05/2021   LDLCALC 79 11/27/2020    Physical Findings: AIMS: Facial and Oral Movements Muscles of Facial Expression: None, normal Lips and Perioral Area: None, normal Jaw: None, normal Tongue: Minimal,Extremity Movements Upper (arms, wrists, hands, fingers): Minimal Lower (legs, knees, ankles, toes): None, normal, Trunk Movements Neck, shoulders, hips: None, normal, Overall Severity Severity of abnormal movements (highest  score from questions above): None, normal Incapacitation due to abnormal movements: None, normal Patient's awareness of abnormal movements (rate only patient's report): No Awareness, Dental Status Current problems with teeth and/or dentures?: No Does patient usually wear dentures?: No  CIWA: n/a COWS:  n/a AIMS: 2  Musculoskeletal: Strength & Muscle Tone: within normal limits Gait & Station: normal Patient leans: N/A  Psychiatric Specialty Exam:  Presentation  General Appearance: Appropriate for Environment; Fairly Groomed  Eye Contact:Good  Speech:Clear and Coherent  Speech Volume:Normal  Handedness:Right  Mood and Affect  Mood:Depressed  Affect:Congruent  Thought Process  Thought Processes:Coherent  Descriptions of Associations:Intact  Orientation:Full (Time, Place and Person)  Thought Content:Logical  History of Schizophrenia/Schizoaffective disorder:No  Duration of Psychotic Symptoms:Greater than six months  Hallucinations:Hallucinations: None  Ideas of Reference:None  Suicidal Thoughts:Suicidal Thoughts: No  Homicidal Thoughts:Homicidal Thoughts: No  Sensorium  Memory:Immediate Good; Recent Fair  Judgment:Fair  Insight:Fair  Executive Functions  Concentration:Fair  Attention Span:Fair  Novelty  Psychomotor Activity  Psychomotor  Activity:Psychomotor Activity: Normal  Assets  Assets:Communication Skills; Housing; Social Support  Sleep  Sleep:Sleep: Fair  Physical Exam: Physical Exam Constitutional:      General: She is not in acute distress. HENT:     Head: Normocephalic.     Right Ear: There is no impacted cerumen.     Nose: Nose normal. No congestion.     Mouth/Throat:     Pharynx: No oropharyngeal exudate.  Eyes:     Pupils: Pupils are equal, round, and reactive to light.  Pulmonary:     Effort: Pulmonary effort is normal. No respiratory distress.  Musculoskeletal:        General: Normal range of motion.     Cervical back: Normal range of motion. No rigidity.  Neurological:     Mental Status: She is alert and oriented to person, place, and time.     Cranial Nerves: No cranial nerve deficit.     Sensory: No sensory deficit.   Review of Systems  Constitutional: Negative.  Negative for chills and fever.  HENT: Negative.  Negative for sore throat and tinnitus.   Eyes: Negative.   Respiratory: Negative.  Negative for cough, shortness of breath and stridor.   Cardiovascular: Negative.  Negative for chest pain.  Gastrointestinal: Negative.  Negative for abdominal pain, constipation, heartburn, nausea and vomiting.  Genitourinary: Negative.   Musculoskeletal: Negative.   Skin: Negative.   Neurological: Negative.  Negative for dizziness and headaches.  Endo/Heme/Allergies:        Allergies: Bee Venom  Psychiatric/Behavioral:  Positive for depression (improving with medications). Negative for suicidal ideas. The patient has insomnia (improving with medications). The patient is not nervous/anxious.   Blood pressure 110/77, pulse 95, temperature 98.1 F (36.7 C), temperature source Oral, resp. rate 16, height _0  (1.702 m), weight 77.8 kg, SpO2 97 %. Body mass index is 26.88 kg/m.  Diagnoses / Active Problems: Bipolar I MRE manic with psychotic features by hx (r/o schizoaffective d/o bipolar type,  r/o psychotic d/o due to general medical condition) UTI Hypokalemia Low Ceruloplasmin-Copper level not significant for Wilson's disease. PLAN: Safety and Monitoring:             -- Voluntary admission to inpatient psychiatric unit for safety, stabilization and treatment - if patient attempts to leave will need IVC             -- Daily contact with patient to assess and evaluate symptoms and progress in  treatment             -- Patient's case to be discussed in multi-disciplinary team meeting             -- Observation Level : q15 minute checks             -- Vital signs:  q12 hours             -- Precautions: suicide   2. Psychiatric Diagnoses and Treatment:              Bipolar I MRE manic with psychotic features by hx (r/o schizoaffective d/o bipolar type, r/o psychotic d/o due to general medical condition)             -- Stopped Abilify due to abrupt decompensation with med change and report of akathisia on medication             -- Continue Zyprexa 15 mg qhs for Psychosis             -- Metabolic profile and EKG monitoring obtained while on an atypical antipsychotic (Lipid panel WNL, A1c 4.9, QTC 497m)             -- Prolactin level 23.6 (was 27.6 3 months ago and 31.8 3 years ago) - will need f/u with PCP after discharge             -- Continue Cogentin 1 mg daily with AIMS monitoring             -- Due to oversedation, discontinued Trazodone PRN             -- Psychosis w/u: Head CT noncontrast negative;  ESR 1,  B12 264, HIV nonreactive, TSH 1.407, alcohol less than 10, CMP within normal limits other than a potassium of 3.0 on admission. Repeat CMP on 2/16 with Potassium at 4.1 (WNL). Glucose of 113, AST 15 and ALT 15, WBC 6.2, UDS negative, RPR non reactive, ceruloplasmin low at 16.2, heavy metal pending, and ANA negative.             -- Encouraged patient to participate in unit milieu and in scheduled group therapies                                  3. 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 Discharge Planning:              -- Social work and case management to assist with discharge planning and identification of hospital follow-up needs prior to discharge             -- Discharge Concerns: Need to establish a safety plan; Medication compliance and effectiveness             -- Discharge Goals: Return home with outpatient referrals for mental health follow-up including medication management/psychotherapy    Janet Spar, Janet Welch, Janet Welch, Janet Welch 03/15/2021, 1:27 PM Patient ID: Janet Welch, female   DOB: 06/13/1975, 46 y.o.   MRN: [id number]

## 2021-03-15 NOTE — Group Note (Signed)
LCSW Group Therapy Note   Group Date: 03/15/2021 Start Time: 1300 End Time: 1400   Therapy Type: Group Therapy  Participation Level:  Active  Description of Group: Patients received a worksheet with an outline of 2 faces. One side is designated for what the pt sees about themselves and the other is what others see. Pts were asked to introduce themselves and share something they like about themself. Pts were then asked to draw, write or color how they view themselves as well as how they are viewed by others. CSW led discussion about the feelings and words associated with each side.   Patient Summary: Pt attended group and remained there the entire time.  The Pt accepted the 2 worksheets and participated by filling in both worksheets.  The Pt participated in the group discussion and demonstrated understanding of the group topic by sharing their thoughts and feelings about who they are and how that compares and contrasts with how they believe other people view them.    Darleen Crocker, [place] 03/15/2021  2:07 PM

## 2021-03-15 NOTE — BH IP Treatment Plan (Signed)
Interdisciplinary Treatment and Diagnostic Plan Update  03/15/2021 MESSINA KOSINSKI MRN: [id number]  Principal Diagnosis: Bipolar I disorder, current or most recent episode manic, severe with mood-congruent psychotic features ([place])  Secondary Diagnoses: Principal Problem:   Bipolar I disorder, current or most recent episode manic, severe with mood-congruent psychotic features ([place])   Current Medications:  Current Facility-Administered Medications  Medication Dose Route Frequency Provider Last Rate Last Admin   acetaminophen (TYLENOL) tablet 650 mg  650 mg Oral Q6H PRN Ethelene Hal, NP       alum & mag hydroxide-simeth (MAALOX/MYLANTA) 200-200-20 MG/5ML suspension 30 mL  30 mL Oral Q4H PRN Ethelene Hal, NP       benztropine (COGENTIN) tablet 1 mg  1 mg Oral QHS Nkwenti, Doris, NP   1 mg at 03/14/21 2151   hydrOXYzine (ATARAX) tablet 25 mg  25 mg Oral TID PRN Ethelene Hal, NP   25 mg at 03/13/21 2054   OLANZapine zydis (ZYPREXA) disintegrating tablet 5 mg  5 mg Oral Q8H PRN Ethelene Hal, NP   5 mg at 03/06/21 2542   And   LORazepam (ATIVAN) tablet 1 mg  1 mg Oral PRN Ethelene Hal, NP       And   ziprasidone (GEODON) injection 20 mg  20 mg Intramuscular PRN Ethelene Hal, NP       magnesium hydroxide (MILK OF MAGNESIA) suspension 30 mL  30 mL Oral Daily PRN Ethelene Hal, NP       OLANZapine (ZYPREXA) tablet 15 mg  15 mg Oral QHS Nkwenti, Doris, NP   15 mg at 03/14/21 2150   polyethylene glycol (MIRALAX / GLYCOLAX) packet 17 g  17 g Oral Daily Nicholes Rough, NP   17 g at 03/13/21 0809   PTA Medications: Medications Prior to Admission  Medication Sig Dispense Refill Last Dose   eszopiclone (LUNESTA) 2 MG TABS tablet Take 1 tablet (2 mg total) by mouth at bedtime. Take immediately before bedtime (Patient taking differently: Take 2 mg by mouth at bedtime. Take immediately before bedtime) 30 tablet 1    Magnesium 200 MG TABS Take 1  tablet (200 mg total) by mouth daily. Take with evening meal 30 tablet 1    OLANZapine (ZYPREXA) 7.5 MG tablet Take 1 tablet (7.5 mg total) by mouth at bedtime. 30 tablet 0    traZODone (DESYREL) 50 MG tablet Take 1 tablet (50 mg total) by mouth at bedtime as needed for sleep. 30 tablet 1     Patient Stressors: Marital or family conflict    Patient Strengths: Physical Health   Treatment Modalities: Medication Management, Group therapy, Case management,  1 to 1 session with clinician, Psychoeducation, Recreational therapy.   Physician Treatment Plan for Primary Diagnosis: Bipolar I disorder, current or most recent episode manic, severe with mood-congruent psychotic features ([place]) Long Term Goal(s): Improvement in symptoms so as ready for discharge   Short Term Goals: Ability to disclose and discuss suicidal ideas Ability to demonstrate self-control will improve Ability to identify and develop effective coping behaviors will improve Compliance with prescribed medications will improve  Medication Management: Evaluate patient's response, side effects, and tolerance of medication regimen.  Therapeutic Interventions: 1 to 1 sessions, Unit Group sessions and Medication administration.  Evaluation of Outcomes: Progressing  Physician Treatment Plan for Secondary Diagnosis: Principal Problem:   Bipolar I disorder, current or most recent episode manic, severe with mood-congruent psychotic features ([place])  Long Term Goal(s): Improvement in symptoms  so as ready for discharge   Short Term Goals: Ability to disclose and discuss suicidal ideas Ability to demonstrate self-control will improve Ability to identify and develop effective coping behaviors will improve Compliance with prescribed medications will improve     Medication Management: Evaluate patient's response, side effects, and tolerance of medication regimen.  Therapeutic Interventions: 1 to 1 sessions, Unit Group sessions and  Medication administration.  Evaluation of Outcomes: Progressing   RN Treatment Plan for Primary Diagnosis: Bipolar I disorder, current or most recent episode manic, severe with mood-congruent psychotic features ([place]) Long Term Goal(s): Knowledge of disease and therapeutic regimen to maintain health will improve  Short Term Goals: Ability to participate in decision making will improve, Ability to verbalize feelings will improve, and Ability to disclose and discuss suicidal ideas  Medication Management: RN will administer medications as ordered by provider, will assess and evaluate patient's response and provide education to patient for prescribed medication. RN will report any adverse and/or side effects to prescribing provider.  Therapeutic Interventions: 1 on 1 counseling sessions, Psychoeducation, Medication administration, Evaluate responses to treatment, Monitor vital signs and CBGs as ordered, Perform/monitor CIWA, COWS, AIMS and Fall Risk screenings as ordered, Perform wound care treatments as ordered.  Evaluation of Outcomes: Progressing   LCSW Treatment Plan for Primary Diagnosis: Bipolar I disorder, current or most recent episode manic, severe with mood-congruent psychotic features ([place]) Long Term Goal(s): Safe transition to appropriate next level of care at discharge, Engage patient in therapeutic group addressing interpersonal concerns.  Short Term Goals: Engage patient in aftercare planning with referrals and resources, Increase social support, and Increase ability to appropriately verbalize feelings  Therapeutic Interventions: Assess for all discharge needs, 1 to 1 time with Social worker, Explore available resources and support systems, Assess for adequacy in community support network, Educate family and significant other(s) on suicide prevention, Complete Psychosocial Assessment, Interpersonal group therapy.  Evaluation of Outcomes: Progressing   Progress in  Treatment: Attending groups: Yes. Participating in groups: Yes. Taking medication as prescribed: Yes. Toleration medication: Yes. Family/Significant other contact made: Yes, individual(s) contacted:  husband Patient understands diagnosis: Yes. Discussing patient identified problems/goals with staff: Yes. Medical problems stabilized or resolved: Yes. Denies suicidal/homicidal ideation: Yes. Issues/concerns per patient self-inventory: No. Other: None  New problem(s) identified: Yes, Describe:  None  New Short Term/Long Term Goal(s):medication stabilization, elimination of SI thoughts, development of comprehensive mental wellness plan.   Patient Goals:  "not want to die"  Discharge Plan or Barriers: Patient recently admitted. CSW will continue to follow and assess for appropriate referrals and possible discharge planning.   Reason for Continuation of Hospitalization: Delusions  Medication stabilization Suicidal ideation  Estimated Length of Stay: 3-5 days   Scribe for Treatment Team: Eliott Nine 03/15/2021 4:34 PM

## 2021-03-15 NOTE — Progress Notes (Signed)
°   03/15/21 0830  Psych Admission Type (Psych Patients Only)  Admission Status Voluntary  Psychosocial Assessment  Patient Complaints Anxiety;[place];Watchful  Facial Expression Flat  Affect Appropriate to circumstance  Speech Logical/coherent;Soft  Interaction Assertive  Appearance/Hygiene Unremarkable  Behavior Characteristics Cooperative;Appropriate to situation  Mood Depressed  Thought Process  Coherency WDL  Content WDL  Delusions None reported or observed  Perception WDL  Hallucination None reported or observed  Judgment Poor  Confusion None  Danger to Self  Current suicidal ideation? Denies       COVID-19 Daily Checkoff  Have you had a fever (temp > 37.80C/100F)  in the past 24 hours?  No  If you have had runny nose, nasal congestion, sneezing in the past 24 hours, has it worsened? No  COVID-19 EXPOSURE  Have you traveled outside the state in the past 14 days? No  Have you been in contact with someone with a confirmed diagnosis of COVID-19 or PUI in the past 14 days without wearing appropriate PPE? No  Have you been living in the same home as a person with confirmed diagnosis of COVID-19 or a PUI (household contact)? No  Have you been diagnosed with COVID-19? No

## 2021-03-15 NOTE — BHH Group Notes (Signed)
Pt attended goals groups and stated her goal is discharge today

## 2021-03-15 NOTE — Group Note (Signed)
Recreation Therapy Group Note   Group Topic:Problem Solving  Group Date: 03/15/2021 Start Time: 0930 End Time: 3474 Facilitators: Victorino Sparrow, LRT,CTRS Location: [address] Dayroom   Goal Area(s) Addresses:  Patient will effectively work with peer towards shared goal.  Patient will identify skills used to make activity successful.  Patient will identify how skills used during activity can be applied to reach post d/c goals.   Group Description: [employer]. In teams of 5-6, patients were given 25 small craft pipe cleaners. Using the materials provided, patients were instructed to compete again the opposing team(s) to build the tallest free-standing structure from floor level. The activity was timed; difficulty increased by Probation officer as Pharmacist, hospital continued.  Systematically resources were removed with additional directions for example, placing one arm behind their back, working in silence, and shape stipulations.   Affect/Mood: Appropriate   Participation Level: Engaged   Participation Quality: Independent   Behavior: Appropriate   Speech/Thought Process: Focused   Insight: Good   Judgement: Good   Modes of Intervention: Competitive Play   Patient Response to Interventions:  Engaged   Education Outcome:  Acknowledges education and In group clarification offered    Clinical Observations/Individualized Feedback: Pt was quiet but engaged with peers in completing activity.  Pt was appropriate and bright during group session.     Plan: Continue to engage patient in RT group sessions 2-3x/week.   Victorino Sparrow, LRT,CTRS 03/15/2021 1:04 PM

## 2021-03-15 NOTE — BHH Counselor (Signed)
CSW spoke with patient regarding discharge.  CSW let patient know that they plan to discharge next Tuesday and that they are planning a family meeting on Monday.  Patient asked about following up with an LAI and that she could do that outpatient but wanted an in person appointment.  At this time her appointments are virtual.  CSW called psychiatry and they are closed after 1pm today.  They will reopen on Monday and CSW will call to see if we can change appt to in person.    Cyncere Ruhe, LCSW, [place] Social Worker  [facility]

## 2021-03-16 MED ORDER — POLYETHYLENE GLYCOL 3350 17 G PO PACK: 17.0000 g | PACK | Freq: Every day | ORAL | Status: DC | PRN

## 2021-03-16 NOTE — BHH Group Notes (Signed)
Goals Group 03/16/2021   Group Focus: affirmation, clarity of thought, and goals/reality orientation Treatment Modality:  Psychoeducation Interventions utilized were assignment, group exercise, and support Purpose: To be able to understand and verbalize the reason for their admission to the hospital. To understand that the medication helps with their chemical imbalance but they also need to work on their choices in life. To be challenged to develop a list of 30 positives about themselves. Also introduce the concept that "feelings" are not reality.  Participation Level:  did not attend Janet Welch

## 2021-03-16 NOTE — Progress Notes (Signed)
°   03/16/21 1100  Psych Admission Type (Psych Patients Only)  Admission Status Voluntary  Psychosocial Assessment  Patient Complaints None  Eye Contact Fair  Facial Expression Flat  Affect Appropriate to circumstance  Speech Logical/coherent  Interaction Minimal  Motor Activity Slow  Appearance/Hygiene Unremarkable  Behavior Characteristics Cooperative  Mood Depressed  Thought Process  Coherency WDL  Content WDL  Delusions None reported or observed  Perception WDL  Hallucination None reported or observed  Judgment Poor  Confusion None  Danger to Self  Current suicidal ideation? Denies  Danger to Others  Danger to Others None reported or observed

## 2021-03-16 NOTE — Progress Notes (Signed)
[facility] MD Progress Note  03/16/2021 1:54 PM Janet Welch  MRN:  [id number]  Subjective: Janet Welch reports, "I'm doing okay. I am a little groggy though, but I know you stated that it is one of the side effects of the medication and should go away?".  Daily Notes: 03/16/2021: Janet Welch is seen, chart reviewed. The chart findings discussed with the treatment team. For this encounter, Janet Welch is seen in her room on the 400 hall. Pt with a euthymic mood, affect is congruent. Her attention to personal hygiene and grooming is fair, eye contact is good, speech is clear & coherent. Thought contents are organized and logical, and pt currently denies SI/HI/AVH or paranoia. There is no evidence of delusional thoughts.  Pt is able to carry on a conversation with this writer without any latency in her speech, and smiles and laughs appropriately. She inquires about discharge planning, and is educated that social work planned on scheduling a family meeting for Monday with her husband to plan discharge.   Pt reports that her sleep quality last night was fair. She reports a good appetite, and reported feeling "a little groggy" earlier in the morning, and states that this feeling has since resolved. Pt is tolerating being on the Zyprexa 15 mg, and the medication is effective in clearing up her psychosis. Cogentin 1 mg is being given nightly for EPS. Pt with mild tremors to tongue and b/l fingers. AIMS-2. Pt reports currently having no constipation. Miralax changed to PRN. Pt reports being interested in a LAI medication to help with her symptoms, and states that her husband is agreeable to it if it will help her. The challenge has been trying to figure out which LAI is covered by her insurance ([employer]). The pharmacist has suggested that pt calls her insurance company to check coverage, and pt's assigned social worker has provided a link to use to search coverage, but link does not provide any information regarding medication  coverage.  Discharge is pending family meeting with pt's husband, and LAI coverage information.   HPI: Janet Welch. Janet Welch is a 46 y.o female with a history of bipolar disorder who was walked voluntarily to this [name] by her husband on 03/04/21 for hallucinations and bizarre behaviors at home. This is pt's 3rd admission to this [name]. Pt was admitted for treatment and stabilization of her mood.  Principal Problem: Bipolar I disorder, current or most recent episode manic, severe with mood-congruent psychotic features (Janet Welch)  Diagnosis: Principal Problem:   Bipolar I disorder, current or most recent episode manic, severe with mood-congruent psychotic features (Janet Welch)  Total Time spent with patient: 20 minutes  Past Psychiatric History: as above  Past Medical History:  Past Medical History:  Diagnosis Date   Bipolar 1 disorder (Janet Welch)    No pertinent past medical history     Past Surgical History:  Procedure Laterality Date   CESAREAN SECTION N/A 07/07/2013   Procedure: CESAREAN SECTION;  Surgeon: Janet Hedges, DO;  Location: [facility];  Service: Obstetrics;  Laterality: N/A;   LEG SKIN LESION  BIOPSY / EXCISION     scar tissue biopsy     Family History:  Family History  Problem Relation Age of Onset   Thyroid disease Mother    Mental illness Mother    Parkinson's disease Father    Stroke Maternal Grandmother    Stroke Maternal Grandfather    Family Psychiatric  History: as above Social History:  Social History   Substance and Sexual  Activity  Alcohol Use Yes   Alcohol/week: 0.0 - 1.0 standard drinks   Comment: socially     Social History   Substance and Sexual Activity  Drug Use Not Currently   Types: Marijuana    Social History   Socioeconomic History   Marital status: Married    Spouse name: Not on file   Number of children: Not on file   Years of education: Not on file   Highest education level: Not on file  Occupational History    Comment: [employer]  Tobacco Use    Smoking status: Never   Smokeless tobacco: Never  Vaping Use   Vaping Use: Never used  Substance and Sexual Activity   Alcohol use: Yes    Alcohol/week: 0.0 - 1.0 standard drinks    Comment: socially   Drug use: Not Currently    Types: Marijuana   Sexual activity: Yes    Birth control/protection: None  Other Topics Concern   Not on file  Social History Narrative   Not on file   Social Determinants of Health   Financial Resource Strain: Not on file  Food Insecurity: Not on file  Transportation Needs: Not on file  Physical Activity: Not on file  Stress: Not on file  Social Connections: Not on file   Additional Social History:    Pain Medications: See MAR Prescriptions: See MAR Over the Counter: See MAR History of alcohol / drug use?: No history of alcohol / drug abuse Longest period of sobriety (when/how long): NA    Sleep: Fair  Appetite:  Good  Current Medications: Current Facility-Administered Medications  Medication Dose Route Frequency Provider Last Rate Last Admin   acetaminophen (TYLENOL) tablet 650 mg  650 mg Oral Q6H PRN Ethelene Hal, NP       alum & mag hydroxide-simeth (MAALOX/MYLANTA) 200-200-20 MG/5ML suspension 30 mL  30 mL Oral Q4H PRN Ethelene Hal, NP       benztropine (COGENTIN) tablet 1 mg  1 mg Oral QHS Rylyn Zawistowski, NP   1 mg at 03/15/21 2200   hydrOXYzine (ATARAX) tablet 25 mg  25 mg Oral TID PRN Ethelene Hal, NP   25 mg at 03/13/21 2054   OLANZapine zydis (ZYPREXA) disintegrating tablet 5 mg  5 mg Oral Q8H PRN Ethelene Hal, NP   5 mg at 03/06/21 6606   And   LORazepam (ATIVAN) tablet 1 mg  1 mg Oral PRN Ethelene Hal, NP       And   ziprasidone (GEODON) injection 20 mg  20 mg Intramuscular PRN Ethelene Hal, NP       magnesium hydroxide (MILK OF MAGNESIA) suspension 30 mL  30 mL Oral Daily PRN Ethelene Hal, NP       OLANZapine (ZYPREXA) tablet 15 mg  15 mg Oral QHS Holli Rengel, NP    15 mg at 03/15/21 2200   polyethylene glycol (MIRALAX / GLYCOLAX) packet 17 g  17 g Oral Daily PRN Nicholes Rough, NP        Lab Results: No results found for this or any previous visit (from the past 1 hour(s)).  Blood Alcohol level:  Lab Results  Component Value Date   [facility] <10 03/05/2021   ETH <10 30/16/0109    Metabolic Disorder Labs: Lab Results  Component Value Date   HGBA1C 4.9 03/05/2021   MPG 93.93 03/05/2021   MPG 99.67 11/22/2020   Lab Results  Component Value Date   PROLACTIN  23.6 (H) 03/07/2021   PROLACTIN 27.6 (H) 11/27/2020   Lab Results  Component Value Date   CHOL 160 03/05/2021   TRIG 54 03/05/2021   HDL 61 03/05/2021   CHOLHDL 2.6 03/05/2021   VLDL 11 03/05/2021   LDLCALC 88 03/05/2021   LDLCALC 79 11/27/2020   Physical Findings: AIMS: Facial and Oral Movements Muscles of Facial Expression: None, normal Lips and Perioral Area: None, normal Jaw: None, normal Tongue: Minimal,Extremity Movements Upper (arms, wrists, hands, fingers): Minimal Lower (legs, knees, ankles, toes): None, normal, Trunk Movements Neck, shoulders, hips: None, normal, Overall Severity Severity of abnormal movements (highest score from questions above): None, normal Incapacitation due to abnormal movements: None, normal Patient's awareness of abnormal movements (rate only patient's report): No Awareness, Dental Status Current problems with teeth and/or dentures?: No Does patient usually wear dentures?: No  CIWA: n/a COWS:  n/a AIMS: 2  Musculoskeletal: Strength & Muscle Tone: within normal limits Gait & Station: normal Patient leans: N/A  Psychiatric Specialty Exam:  Presentation  General Appearance: Appropriate for Environment  Eye Contact:Good  Speech:Clear and Coherent  Speech Volume:Normal  Handedness:Right  Mood and Affect  Mood:Euthymic  Affect:Appropriate  Thought Process  Thought Processes:Coherent  Descriptions of  Associations:Intact  Orientation:Full (Time, Place and Person)  Thought Content:Logical  History of Schizophrenia/Schizoaffective disorder:No  Duration of Psychotic Symptoms:Greater than six months  Hallucinations:Hallucinations: None   Ideas of Reference:None  Suicidal Thoughts:Suicidal Thoughts: No   Homicidal Thoughts:Homicidal Thoughts: No   Sensorium  Memory:Immediate Good  Judgment:Fair  Insight:Fair  Executive Functions  Concentration:Fair  Attention Span:Fair  [place]  Psychomotor Activity  Psychomotor Activity:Psychomotor Activity: Normal   Assets  Assets:Communication Skills  Sleep  Sleep:Sleep: Fair   Physical Exam: Physical Exam Constitutional:      General: She is not in acute distress. HENT:     Head: Normocephalic.     Right Ear: There is no impacted cerumen.     Nose: Nose normal. No congestion.     Mouth/Throat:     Pharynx: No oropharyngeal exudate.  Eyes:     Pupils: Pupils are equal, round, and reactive to light.  Pulmonary:     Effort: Pulmonary effort is normal. No respiratory distress.  Musculoskeletal:        General: Normal range of motion.     Cervical back: Normal range of motion. No rigidity.  Neurological:     Mental Status: She is alert and oriented to person, place, and time.     Cranial Nerves: No cranial nerve deficit.     Sensory: No sensory deficit.   Review of Systems  Constitutional: Negative.  Negative for chills and fever.  HENT: Negative.  Negative for sore throat and tinnitus.   Eyes: Negative.   Respiratory: Negative.  Negative for cough, shortness of breath and stridor.   Cardiovascular: Negative.  Negative for chest pain.  Gastrointestinal: Negative.  Negative for abdominal pain, constipation, heartburn, nausea and vomiting.  Genitourinary: Negative.   Musculoskeletal: Negative.   Skin: Negative.   Neurological: Negative.  Negative for dizziness and  headaches.  Endo/Heme/Allergies:        Allergies: Bee Venom  Psychiatric/Behavioral:  Positive for depression (improving with medications). Negative for suicidal ideas. The patient has insomnia (improving with medications). The patient is not nervous/anxious.   Blood pressure 122/86, pulse (!) 112, temperature 98.2 F (36.8 C), temperature source Oral, resp. rate 16, height 5' 7"  (1.702 m), weight 77.8 kg, SpO2 98 %.  Body mass index is 26.88 kg/m.  Diagnoses / Active Problems: Bipolar I MRE manic with psychotic features by hx (r/o schizoaffective d/o bipolar type, r/o psychotic d/o due to general medical condition) UTI Hypokalemia Low Ceruloplasmin-Copper level not significant for Wilson's disease. PLAN: Safety and Monitoring:             -- Voluntary admission to inpatient psychiatric unit for safety, stabilization and treatment - if patient attempts to leave will need IVC             -- Daily contact with patient to assess and evaluate symptoms and progress in treatment             -- Patient's case to be discussed in multi-disciplinary team meeting             -- Observation Level : q15 minute checks             -- Vital signs:  q12 hours             -- Precautions: suicide   2. Psychiatric Diagnoses and Treatment:              Bipolar I MRE manic with psychotic features by hx (r/o schizoaffective d/o bipolar type, r/o psychotic d/o due to general medical condition)             -- Stopped Abilify due to abrupt decompensation with med change and report of akathisia on medication             -- Continue Zyprexa 15 mg qhs for Psychosis             -- Metabolic profile and EKG monitoring obtained while on an atypical antipsychotic (Lipid panel WNL, A1c 4.9, QTC 421m)             -- Prolactin level 23.6 (was 27.6 3 months ago and 31.8 3 years ago) - will need f/u with PCP after discharge             -- Continue Cogentin 1 mg daily with AIMS monitoring             -- Due to oversedation,  discontinued Trazodone PRN             -- Psychosis w/u: Head CT noncontrast negative;  ESR 1,  B12 264, HIV nonreactive, TSH 1.407, alcohol less than 10, CMP within normal limits other than a potassium of 3.0 on admission. Repeat CMP on 2/16 with Potassium at 4.1 (WNL). Glucose of 113, AST 15 and ALT 15, WBC 6.2, UDS negative, RPR non reactive, ceruloplasmin low at 16.2, heavy metal pending, and ANA negative.             -- Encouraged patient to participate in unit milieu and in scheduled group therapies                                  3. 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 Discharge Planning:              -- Social work and case management to assist with discharge planning and identification of hospital follow-up needs prior to discharge             -- Discharge Concerns: Need to establish a safety plan; Medication compliance and effectiveness             -- Discharge Goals: Return home with outpatient referrals for mental health follow-up including medication management/psychotherapy    DNicholes Rough NP, pmhnp 03/16/2021, 1:54 PM Patient ID: DLes Pou female   DOB: 124-Jul-1977 46y.o.   MRN: [id number]

## 2021-03-16 NOTE — Progress Notes (Signed)
°   03/16/21 2242  Psych Admission Type (Psych Patients Only)  Admission Status Voluntary  Psychosocial Assessment  Patient Complaints None  Eye Contact Fair  Facial Expression Flat  Affect Appropriate to circumstance  Speech Logical/coherent  Interaction Minimal  Motor Activity Other (Comment) (WDL)  Appearance/Hygiene Unremarkable  Behavior Characteristics Appropriate to situation  Mood Pleasant  Thought Process  Coherency WDL  Content WDL  Delusions None reported or observed  Perception WDL  Hallucination None reported or observed  Judgment WDL  Confusion None  Danger to Self  Self-Injurious Behavior No self-injurious ideation or behavior indicators observed or expressed   Agreement Not to Harm Self Yes  Description of Agreement Verbal  Danger to Others  Danger to Others None reported or observed  Danger to Others Abnormal  Harmful Behavior to others No threats or harm toward other people  Destructive Behavior No threats or harm toward property

## 2021-03-16 NOTE — BHH Group Notes (Signed)
.  Psychoeducational Group Note    Date:03/16/21 Time: 1300-1400    Purpose of Group: . The group focus' on teaching patients on how to identify their needs and their Life Skills:  A group where two lists are made. What people need and what are things that we do that are unhealthy. The lists are developed by the patients and it is explained that we often do the actions that are not healthy to get our list of needs met.  Goal:: to develop the coping skills needed to get their needs met  Participation Level:  Active  Participation Quality:  Appropriate  Affect:  Appropriate  Cognitive:  Oriented  Insight:  Improving  Engagement in Group:  Engaged  Additional Comments: Pt rates her energy at 5/10. Participated fully in the group. Gave and received feedback.  [name]

## 2021-03-16 NOTE — Group Note (Signed)
LCSW Group Therapy   Due to high patient acuity and staffing, group was unable to be held on 03/16/2021. The CSW supervisor as well as the on-duty [facility] was made aware.  Janet Welch T Janet Welch LCSWA  2:43 PM

## 2021-03-16 NOTE — Progress Notes (Signed)
[place] Group Notes:  (Nursing/MHT/Case Management/Adjunct)  Date:  03/16/2021  Time:  2015 Type of Therapy:   wrap up group  Participation Level:  Active  Participation Quality:  Appropriate, Attentive, Sharing, and Supportive  Affect:  Flat  Cognitive:  Alert  Insight:  Improving  Engagement in Group:  Engaged  Modes of Intervention:  Clarification, Education, and Support  Summary of Progress/Problems: Positive thinking and positive change were discussed.   Winfield Rast S 03/16/2021, 9:06 PM

## 2021-03-16 NOTE — Progress Notes (Signed)
°   03/16/21 0043  Psych Admission Type (Psych Patients Only)  Admission Status Voluntary  Psychosocial Assessment  Patient Complaints Anxiety  Eye Contact Fair  Facial Expression Flat  Affect Appropriate to circumstance  Speech Logical/coherent  Interaction Minimal  Motor Activity Other (Comment) (WDL)  Behavior Characteristics Appropriate to situation  Mood Depressed  Thought Process  Coherency WDL  Content WDL  Delusions None reported or observed  Perception WDL  Hallucination None reported or observed  Judgment Poor  Confusion None  Danger to Self  Current suicidal ideation? Denies  Self-Injurious Behavior No self-injurious ideation or behavior indicators observed or expressed   Agreement Not to Harm Self Yes  Description of Agreement Verbal  Danger to Others  Danger to Others None reported or observed  Danger to Others Abnormal  Harmful Behavior to others No threats or harm toward other people  Destructive Behavior No threats or harm toward property

## 2021-03-17 MED ORDER — MELATONIN 3 MG PO TABS
3.0000 mg | ORAL_TABLET | Freq: Every day | ORAL | Status: DC
  Administered 2021-03-17 – 2021-03-18 (×2): 3 mg via ORAL
  Filled 2021-03-17 (×3): qty 1

## 2021-03-17 NOTE — Group Note (Signed)
LCSW Group Therapy Note   Group Date: 03/17/2021 Start Time: 1000 End Time: 1100   Type of Therapy and Topic:  Group Therapy:   LCSW Group Therapy     Due to high patient acuity and staffing, group was unable to be held on 03/17/2021. The CSW supervisor as well as the on-duty [facility] was made aware.    Vassie Moselle, LCSW 03/17/2021  12:30 PM

## 2021-03-17 NOTE — Progress Notes (Signed)
°   03/17/21 2335  Psych Admission Type (Psych Patients Only)  Admission Status Voluntary  Psychosocial Assessment  Patient Complaints None  Eye Contact Fair  Facial Expression Flat (brightens with conversation)  Affect Appropriate to circumstance  Speech Logical/coherent  Interaction Assertive  Motor Activity Other (Comment) (WDL)  Appearance/Hygiene Unremarkable  Behavior Characteristics Appropriate to situation  Mood Pleasant  Thought Process  Coherency WDL  Content WDL  Delusions None reported or observed  Perception WDL  Hallucination None reported or observed  Judgment WDL  Confusion None  Danger to Self  Current suicidal ideation? Denies  Self-Injurious Behavior No self-injurious ideation or behavior indicators observed or expressed   Agreement Not to Harm Self Yes  Description of Agreement Verbal  Danger to Others  Danger to Others None reported or observed  Danger to Others Abnormal  Harmful Behavior to others No threats or harm toward other people  Destructive Behavior No threats or harm toward property

## 2021-03-17 NOTE — BHH Group Notes (Signed)
Adult Psychoeducational Group Not Date:  03/17/2021 Time:  3005-1102 Group Topic/Focus: PROGRESSIVE RELAXATION. A group where deep breathing is taught and tensing and relaxation muscle groups is used. Imagery is used as well.  Pts are asked to imagine 3 pillars that hold them up when they are not able to hold themselves up.  Participation Level:  Active  Participation Quality:  Appropriate  Affect:  Appropriate  Cognitive:  Oriented  Insight: Improving  Engagement in Group:  Engaged  Modes of Intervention:  Activity, Discussion, Education, and Support  Additional Comments:  Rates her energy at a 5.5. Participated in the discussion in the group.   [name]

## 2021-03-17 NOTE — BHH Group Notes (Signed)
Pt attended group and participated in discussion. 

## 2021-03-17 NOTE — Progress Notes (Signed)
[facility] MD Progress Note  03/17/2021 1:42 PM KINDSEY EBLIN  MRN:  [id number]  Subjective: Janet Welch reports, "I'm a little tired this morning."  Daily Notes: 03/17/2021: Cassiopeia is seen, chart reviewed. The chart findings discussed with the treatment team. For this encounter, Dametria is seen in her room on the 400 hall. Pt with a flat affect and depressed mood. Her attention to personal hygiene and grooming is fair, eye contact is good, speech is clear & coherent. Thought contents are organized and logical, and pt currently denies SI/HI/AVH or paranoia. There is no evidence of delusional thoughts. Pt is continuing to inquire about the family meeting with her husband and has been educated that Education officer, museum has made attempts to call her husband and there has not been an answer.   Pt reports that her sleep quality last night was fair. She reports a good appetite, and reported feeling "a little tired" earlier in the morning, but was able to get out of bed and go to lunch and attend group sessions. Pt denies any medication related side effects, and is tolerating taking the Zyprexa 15 mg nightly. She continues to report sleep as being "fair" with morning tiredness. Will add Melatonin 5 mg nightly for insomnia. Pt observed to have mild tremors to b/l fingers and tongue. She is on Cogentin 1 mg nightly for EPS.   HPI: Janet Pringle. Welch is a 46 y.o female with a history of bipolar disorder who was walked voluntarily to this [facility] by her husband on 03/04/21 for hallucinations and bizarre behaviors at home. This is pt's 3rd admission to this [facility]. Pt was admitted for treatment and stabilization of her mood.  Principal Problem: Bipolar I disorder, current or most recent episode manic, severe with mood-congruent psychotic features ([place])  Diagnosis: Principal Problem:   Bipolar I disorder, current or most recent episode manic, severe with mood-congruent psychotic features ([place])  Total Time spent with patient: 20 minutes  Past  Psychiatric History: as above  Past Medical History:  Past Medical History:  Diagnosis Date   Bipolar 1 disorder ([place])    No pertinent past medical history     Past Surgical History:  Procedure Laterality Date   CESAREAN SECTION N/A 07/07/2013   Procedure: CESAREAN SECTION;  Surgeon: Linda Hedges, DO;  Location: [facility];  Service: Obstetrics;  Laterality: N/A;   LEG SKIN LESION  BIOPSY / EXCISION     scar tissue biopsy     Family History:  Family History  Problem Relation Age of Onset   Thyroid disease Mother    Mental illness Mother    Parkinson's disease Father    Stroke Maternal Grandmother    Stroke Maternal Grandfather    Family Psychiatric  History: as above Social History:  Social History   Substance and Sexual Activity  Alcohol Use Yes   Alcohol/week: 0.0 - 1.0 standard drinks   Comment: socially     Social History   Substance and Sexual Activity  Drug Use Not Currently   Types: Marijuana    Social History   Socioeconomic History   Marital status: Married    Spouse name: Not on file   Number of children: Not on file   Years of education: Not on file   Highest education level: Not on file  Occupational History    Comment: [employer]  Tobacco Use   Smoking status: Never   Smokeless tobacco: Never  Vaping Use   Vaping Use: Never used  Substance and Sexual  Activity   Alcohol use: Yes    Alcohol/week: 0.0 - 1.0 standard drinks    Comment: socially   Drug use: Not Currently    Types: Marijuana   Sexual activity: Yes    Birth control/protection: None  Other Topics Concern   Not on file  Social History Narrative   Not on file   Social Determinants of Health   Financial Resource Strain: Not on file  Food Insecurity: Not on file  Transportation Needs: Not on file  Physical Activity: Not on file  Stress: Not on file  Social Connections: Not on file   Additional Social History:    Pain Medications: See MAR Prescriptions: See MAR Over the  Counter: See MAR History of alcohol / drug use?: No history of alcohol / drug abuse Longest period of sobriety (when/how long): NA    Sleep: Fair  Appetite:  Good  Current Medications: Current Facility-Administered Medications  Medication Dose Route Frequency Provider Last Rate Last Admin   acetaminophen (TYLENOL) tablet 650 mg  650 mg Oral Q6H PRN Ethelene Hal, NP       alum & mag hydroxide-simeth (MAALOX/MYLANTA) 200-200-20 MG/5ML suspension 30 mL  30 mL Oral Q4H PRN Ethelene Hal, NP       benztropine (COGENTIN) tablet 1 mg  1 mg Oral QHS Khyla Mccumbers, NP   1 mg at 03/16/21 2112   hydrOXYzine (ATARAX) tablet 25 mg  25 mg Oral TID PRN Ethelene Hal, NP   25 mg at 03/13/21 2054   OLANZapine zydis (ZYPREXA) disintegrating tablet 5 mg  5 mg Oral Q8H PRN Ethelene Hal, NP   5 mg at 03/06/21 1610   And   LORazepam (ATIVAN) tablet 1 mg  1 mg Oral PRN Ethelene Hal, NP       And   ziprasidone (GEODON) injection 20 mg  20 mg Intramuscular PRN Ethelene Hal, NP       magnesium hydroxide (MILK OF MAGNESIA) suspension 30 mL  30 mL Oral Daily PRN Ethelene Hal, NP       melatonin tablet 3 mg  3 mg Oral QHS Colburn Asper, NP       OLANZapine (ZYPREXA) tablet 15 mg  15 mg Oral QHS Kamarie Veno, NP   15 mg at 03/16/21 2112   polyethylene glycol (MIRALAX / GLYCOLAX) packet 17 g  17 g Oral Daily PRN Nicholes Rough, NP        Lab Results: No results found for this or any previous visit (from the past 23 hour(s)).  Blood Alcohol level:  Lab Results  Component Value Date   ETH <10 03/05/2021   ETH <10 96/04/5407    Metabolic Disorder Labs: Lab Results  Component Value Date   HGBA1C 4.9 03/05/2021   MPG 93.93 03/05/2021   MPG 99.67 11/22/2020   Lab Results  Component Value Date   PROLACTIN 23.6 (H) 03/07/2021   PROLACTIN 27.6 (H) 11/27/2020   Lab Results  Component Value Date   CHOL 160 03/05/2021   TRIG 54 03/05/2021   HDL  61 03/05/2021   CHOLHDL 2.6 03/05/2021   VLDL 11 03/05/2021   LDLCALC 88 03/05/2021   LDLCALC 79 11/27/2020   Physical Findings: AIMS: Facial and Oral Movements Muscles of Facial Expression: None, normal Lips and Perioral Area: None, normal Jaw: None, normal Tongue: Minimal,Extremity Movements Upper (arms, wrists, hands, fingers): Minimal Lower (legs, knees, ankles, toes): None, normal, Trunk Movements Neck, shoulders, hips: None, normal, Overall  Severity Severity of abnormal movements (highest score from questions above): None, normal Incapacitation due to abnormal movements: None, normal Patient's awareness of abnormal movements (rate only patient's report): No Awareness, Dental Status Current problems with teeth and/or dentures?: No Does patient usually wear dentures?: No  CIWA: n/a COWS:  n/a AIMS: 2  Musculoskeletal: Strength & Muscle Tone: within normal limits Gait & Station: normal Patient leans: N/A  Psychiatric Specialty Exam:  Presentation  General Appearance: Appropriate for Environment; Fairly Groomed  Eye Contact:Good  Speech:Clear and Coherent  Speech Volume:Normal  Handedness:Right  Mood and Affect  Mood:Depressed  Affect:Flat  Thought Process  Thought Processes:Coherent  Descriptions of Associations:Intact  Orientation:Full (Time, Place and Person)  Thought Content:Logical  History of Schizophrenia/Schizoaffective disorder:No  Duration of Psychotic Symptoms:Greater than six months  Hallucinations:Hallucinations: None  Ideas of Reference:None  Suicidal Thoughts:Suicidal Thoughts: No   Homicidal Thoughts:Homicidal Thoughts: No   Sensorium  Memory:Immediate Good; Recent Good  Judgment:Fair  Insight:Fair  Executive Functions  Concentration:Fair  Attention Span:Fair  [place]  Psychomotor Activity  Psychomotor Activity:Psychomotor Activity: Normal  Assets   Assets:Communication Skills; Housing; Social Support  Sleep  Sleep:Sleep: Fair  Physical Exam: Physical Exam Constitutional:      General: She is not in acute distress. HENT:     Head: Normocephalic.     Right Ear: There is no impacted cerumen.     Nose: Nose normal. No congestion.     Mouth/Throat:     Pharynx: No oropharyngeal exudate.  Eyes:     Pupils: Pupils are equal, round, and reactive to light.  Pulmonary:     Effort: Pulmonary effort is normal. No respiratory distress.  Musculoskeletal:        General: Normal range of motion.     Cervical back: Normal range of motion. No rigidity.  Neurological:     Mental Status: She is alert and oriented to person, place, and time.     Cranial Nerves: No cranial nerve deficit.     Sensory: No sensory deficit.   Review of Systems  Constitutional: Negative.  Negative for chills and fever.  HENT: Negative.  Negative for sore throat and tinnitus.   Eyes: Negative.   Respiratory: Negative.  Negative for cough, shortness of breath and stridor.   Cardiovascular: Negative.  Negative for chest pain.  Gastrointestinal: Negative.  Negative for abdominal pain, constipation, heartburn, nausea and vomiting.  Genitourinary: Negative.   Musculoskeletal: Negative.   Skin: Negative.   Neurological: Negative.  Negative for dizziness and headaches.  Endo/Heme/Allergies:        Allergies: Bee Venom  Psychiatric/Behavioral:  Positive for depression (improving with medications). Negative for suicidal ideas. The patient has insomnia (improving with medications). The patient is not nervous/anxious.   Blood pressure 117/69, pulse (!) 104, temperature (!) 97.2 F (36.2 C), temperature source Oral, resp. rate 16, height 5' 7"  (1.702 m), weight 77.8 kg, SpO2 96 %. Body mass index is 26.88 kg/m.  Diagnoses / Active Problems: Bipolar I MRE manic with psychotic features by hx (r/o schizoaffective d/o bipolar type, r/o psychotic d/o due to general medical  condition) UTI Hypokalemia Low Ceruloplasmin-Copper level not significant for Wilson's disease. PLAN: Safety and Monitoring:             -- Voluntary admission to inpatient psychiatric unit for safety, stabilization and treatment - if patient attempts to leave will need IVC             -- Daily contact  with patient to assess and evaluate symptoms and progress in treatment             -- Patient's case to be discussed in multi-disciplinary team meeting             -- Observation Level : q15 minute checks             -- Vital signs:  q12 hours             -- Precautions: suicide   2. Psychiatric Diagnoses and Treatment:              Bipolar I MRE manic with psychotic features by hx (r/o schizoaffective d/o bipolar type, r/o psychotic d/o due to general medical condition)             -- Stopped Abilify due to abrupt decompensation with med change and report of akathisia on medication             -- Continue Zyprexa 15 mg qhs for Psychosis             -- Metabolic profile and EKG monitoring obtained while on an atypical antipsychotic (Lipid panel WNL, A1c 4.9, QTC 476m)             -- Prolactin level 23.6 (was 27.6 3 months ago and 31.8 3 years ago) - will need f/u with PCP after discharge             -- Continue Cogentin 1 mg daily with AIMS monitoring             -- Due to oversedation, discontinued Trazodone PRN             -- Psychosis w/u: Head CT noncontrast negative;  ESR 1,  B12 264, HIV nonreactive, TSH 1.407, alcohol less than 10, CMP within normal limits other than a potassium of 3.0 on admission. Repeat CMP on 2/16 with Potassium at 4.1 (WNL). Glucose of 113, AST 15 and ALT 15, WBC 6.2, UDS negative, RPR non reactive, ceruloplasmin low at 16.2, heavy metal pending, and ANA negative.             -- Encouraged patient to participate in unit milieu and in scheduled group therapies                                  3. Medical Issues Being Addressed:              UTI             --  Macrobid 1074mbid for 7 days -completed             -- Preliminary culture results show 60,000 CFU E coli and >100,000 CFU streptococcus agalactiae-Pt treated with Macrobid 100 mg BID x 7 days (completed)               Hypokalemia - resolved             - Patient given K+ replacement and repeat BMP shows K+ 4.1               Constipation             -- Discontinued colace 10061maily              --Continue to push po fluids             -- Continue Miralax 17g daily  PRN                Insomnia            -Start Melatonin 3 mg nightly               Low Ceruloplasmin             - 24 hour urine copper and serum copper levels as part of wilson's disease w/u completed-Copper level not significant for Wilson's disease   4. Discharge Planning:              -- Social work and case management to assist with discharge planning and identification of hospital follow-up needs prior to discharge             -- Discharge Concerns: Need to establish a safety plan; Medication compliance and effectiveness             -- Discharge Goals: Return home with outpatient referrals for mental health follow-up including medication management/psychotherapy    Nicholes Rough, NP, pmhnp 03/17/2021, 1:42 PM Patient ID: Janet Welch, female   DOB: 05/31/1975, 46 y.o.   MRN: [id number]

## 2021-03-17 NOTE — B[facility] Group Notes (Signed)
Adult Psychoeducational Group Not Date:  03/17/2021 Time:  3507-5732 Group Topic/Focus: PROGRESSIVE RELAXATION. A group where deep breathing is taught and tensing and relaxation muscle groups is used. Imagery is used as well.  Pts are asked to imagine 3 pillars that hold them up when they are not able to hold themselves up.  Participation Level:  Active  Participation Quality:  Appropriate  Affect:  Appropriate  Cognitive:  Oriented  Insight: Improving  Engagement in Group:  Engaged  Modes of Intervention:  Activity, Discussion, Education, and Support  Additional Comments:  Pt rates her energy level at a 5/10. States God, her family and friends hold her up.  Janet Welch

## 2021-03-17 NOTE — Progress Notes (Signed)
°   03/17/21 1100  Psych Admission Type (Psych Patients Only)  Admission Status Voluntary  Psychosocial Assessment  Patient Complaints None  Eye Contact Suspiciousness  Facial Expression Flat  Affect Appropriate to circumstance  Speech Logical/coherent;Soft  Interaction Avoidant;Minimal  Motor Activity Other (Comment) (WDL)  Appearance/Hygiene Unremarkable  Behavior Characteristics Anxious  Mood Suspicious;Pleasant  Thought Process  Coherency WDL  Content WDL  Delusions None reported or observed  Perception WDL  Hallucination None reported or observed  Judgment WDL  Confusion None  Danger to Self  Current suicidal ideation? Denies  Danger to Others  Danger to Others None reported or observed

## 2021-03-18 ENCOUNTER — Encounter (HOSPITAL_COMMUNITY): Payer: Self-pay | Admitting: Psychiatry

## 2021-03-18 NOTE — Group Note (Signed)
LCSW Group Therapy Note  Group Date: 03/18/2021 Start Time: 1300 End Time: 1400  Type of Therapy and Topic:  Group Therapy: Positive Affirmations  Participation Level:  Active   Description of Group:   This group addressed positive affirmation towards self and others.  Patients went around the room and identified two positive things about themselves and two positive things about a peer in the room.  Patients reflected on how it felt to share something positive with others, to identify positive things about themselves, and to hear positive things from others/ Patients were encouraged to have a daily reflection of positive characteristics or circumstances.   Therapeutic Goals: Patients will verbalize two of their positive qualities Patients will demonstrate empathy for others by stating two positive qualities about a peer in the group Patients will verbalize their feelings when voicing positive self affirmations and when voicing positive affirmations of others Patients will discuss the potential positive impact on their wellness/recovery of focusing on positive traits of self and others.  Summary of Patient Progress:  Pt was actively engaged in the discussion and  was able to identify positive affirmations about themself as well as other group members. Patient demonstrated insight into the subject matter, was respectful of peers, and participated throughout the entire session.  Pt also accepted the worksheets that were provided and followed along with those worksheets.    Therapeutic Modalities:   Cognitive Behavioral Therapy Motivational Interviewing   Darleen Crocker, [place] 03/18/2021  1:54 PM

## 2021-03-18 NOTE — Progress Notes (Signed)
Psychoeducational Group Note  Date:  03/18/2021 Time:  2113  Group Topic/Focus:  Wrap-Up Group:   The focus of this group is to help patients review their daily goal of treatment and discuss progress on daily workbooks.  Participation Level: Did Not Attend  Participation Quality:  Not Applicable  Affect:  Not Applicable  Cognitive:  Not Applicable  Insight:  Not Applicable  Engagement in Group: Not Applicable  Additional Comments:  The patient did not attend the evening A.A.meeting.   Archie Balboa S 03/18/2021, 9:13 PM

## 2021-03-18 NOTE — Progress Notes (Signed)
Meeting held in the [facility] conference room with patient, her husband, Dr. Berdine Addison, and pt's assigned social worker Lenna Sciara). Meeting was held for approximately 45 minutes. Pt calm and coherent in this meeting, and actively participated and made her wants known to the team. Pt reported that she is sleeping better, and that she is less restless than prior to admission. Pt reports that she still feels groggy some mornings, but has understood that the feeling should go away as her system gets used to taking this medication. Pt agreed to take her medications as ordered once discharged, and her husband was agreeable to helping pt manage her medications at home. Pt's husband however brought up the fact that pt typically states that she does not want to be treated like a child when he tries to remind her to take her medications. Pt's husband reported that he has tried to implement certain changes at home so as to reduce pt's stressors; he reported that he has enrolled their children in school, and that they were home schooled for 4 years and that was a stressor for his wife. He also reported that he has asked his in laws (pt's family consisting of her parents and brother who live on their property) to leave since pt worries a lot about them and does not take care of herself.  Pt's husband also inquired about having pt on a long acting injectable antipsychotic and was educated that Dr. Berdine Addison and this Probation officer explored this option, but their insurance did not provide coverage for any of the LAIs. Social worker educated pt's husband to explore the option of having the outpatient mental health provider complete a prior authorization for LAI and sent to the health insurance company and seek coverage for medication. Pt's husband was educated on the medication changes that have occurred during this hospital stay, including the fact that Zyprexa 15 mg nightly has been very effective in the management of pt's psychosis. At the end of the  encounter, pt was agreeable to having her husband help her with managing her medications at home. She was also agreeable to having her husband attend some of her mental health appointments with her including her initial post hospitalization visit. Husband was agreeable to picking pt up at about 12 pm tomorrow morning. He thanked attendees of the meeting and it ended.

## 2021-03-18 NOTE — Progress Notes (Signed)
DAR NOTE: Patient presents with calm affect and depressed mood.  Denies suicidal thoughts, pain, auditory and visual hallucinations.  Described energy level as low and concentration as good.  Rates depression at 2, hopelessness at 2, and anxiety at 2.  Maintained on routine safety checks.  Medications given as prescribed.  Support and encouragement offered as needed.  Attended group and participated.  States goal for today is "getting ready for discharge."  Patient visible in milieu watching TV with minimal interaction with peers in the dayroom.  Offered no complaint.  Patient is safe on and off the unit.

## 2021-03-18 NOTE — Progress Notes (Signed)
[facility] MD Progress Note  03/18/2021 2:32 PM Janet Welch  MRN:  [id number]  Subjective:  "I feel much more normal and grounded compared to when I was admitted."  Today's Note 03/18/21: Janet Welch. Hackley is seen face-to-face in her room and examined sitting on her bed in 400 hall. Chart is reviewed and findings shared with the Treatment Team and discussed with Dr. Berdine Addison. On encounter, patient is alert  and oriented x 4 and responds appropriately to questions. She is calm, and dressed appropriately for the weather. Speech is normal volume and fluent however, slow to respond at times. Thought process is goal directed, coherent and linear. Affect is somehow flat. Mood is anxious and depressed . No ideas of reference exhibited.   Patient reported that she consumed 100% of all her meals and only missed on meal since admission. Reported no thought blocking, restlessness, or trouble sleeping last night. Added, "I am taking Melatonin 3 mg and that is helping with my sleep." Denied suicidal ideation, homicidal ideation or auditory/visual hallucinations. Rated anxiety as "2", depression "2" and helplessness as "2" as on a scale of 0 to 10. Denies constipation and continues on MiraLax prn.  Brief History: Janet Welch. Janet Welch is a 46 y.o female with a history of bipolar disorder who was walked voluntarily to this [facility] by her husband on 03/04/21 for hallucinations and bizarre behaviors at home. This is pt's 3rd admission to this [facility]. Pt was admitted for treatment and stabilization of her mood.   Principal Problem: Bipolar I disorder, current or most recent episode manic, severe with mood-congruent psychotic features ([place])  Diagnosis: Principal Problem:   Bipolar I disorder, current or most recent episode manic, severe with mood-congruent psychotic features ([place])  Total Time spent with patient: 20 minutes  Past Psychiatric History: Bipolar 1 Disorder  Past Medical History:  Past Medical History:  Diagnosis Date    Bipolar 1 disorder ([place])    No pertinent past medical history     Past Surgical History:  Procedure Laterality Date   CESAREAN SECTION N/A 07/07/2013   Procedure: CESAREAN SECTION;  Surgeon: Linda Hedges, DO;  Location: [facility];  Service: Obstetrics;  Laterality: N/A;   LEG SKIN LESION  BIOPSY / EXCISION     scar tissue biopsy     Family History:  Family History  Problem Relation Age of Onset   Thyroid disease Mother    Mental illness Mother    Parkinson's disease Father    Stroke Maternal Grandmother    Stroke Maternal Grandfather    Family Psychiatric  History: Unable to assess  Social History:  Social History   Substance and Sexual Activity  Alcohol Use Yes   Alcohol/week: 0.0 - 1.0 standard drinks   Comment: socially     Social History   Substance and Sexual Activity  Drug Use Not Currently   Types: Marijuana    Social History   Socioeconomic History   Marital status: Married    Spouse name: Not on file   Number of children: Not on file   Years of education: Not on file   Highest education level: Not on file  Occupational History    Comment: [employer]  Tobacco Use   Smoking status: Never   Smokeless tobacco: Never  Vaping Use   Vaping Use: Never used  Substance and Sexual Activity   Alcohol use: Yes    Alcohol/week: 0.0 - 1.0 standard drinks    Comment: socially   Drug use: Not  Currently    Types: Marijuana   Sexual activity: Yes    Birth control/protection: None  Other Topics Concern   Not on file  Social History Narrative   Not on file   Social Determinants of Health   Financial Resource Strain: Not on file  Food Insecurity: Not on file  Transportation Needs: Not on file  Physical Activity: Not on file  Stress: Not on file  Social Connections: Not on file   Additional Social History:    Pain Medications: See MAR Prescriptions: See MAR Over the Counter: See MAR History of alcohol / drug use?: No history of alcohol / drug abuse Longest  period of sobriety (when/how long): NA   Sleep: Good  Appetite:  Good  Current Medications: Current Facility-Administered Medications  Medication Dose Route Frequency Provider Last Rate Last Admin   acetaminophen (TYLENOL) tablet 650 mg  650 mg Oral Q6H PRN Ethelene Hal, NP       alum & mag hydroxide-simeth (MAALOX/MYLANTA) 200-200-20 MG/5ML suspension 30 mL  30 mL Oral Q4H PRN Ethelene Hal, NP       benztropine (COGENTIN) tablet 1 mg  1 mg Oral QHS Nkwenti, Doris, NP   1 mg at 03/17/21 2021   hydrOXYzine (ATARAX) tablet 25 mg  25 mg Oral TID PRN Ethelene Hal, NP   25 mg at 03/13/21 2054   OLANZapine zydis (ZYPREXA) disintegrating tablet 5 mg  5 mg Oral Q8H PRN Ethelene Hal, NP   5 mg at 03/06/21 2620   And   LORazepam (ATIVAN) tablet 1 mg  1 mg Oral PRN Ethelene Hal, NP       And   ziprasidone (GEODON) injection 20 mg  20 mg Intramuscular PRN Ethelene Hal, NP       magnesium hydroxide (MILK OF MAGNESIA) suspension 30 mL  30 mL Oral Daily PRN Ethelene Hal, NP       melatonin tablet 3 mg  3 mg Oral QHS Nkwenti, Doris, NP   3 mg at 03/17/21 2021   OLANZapine (ZYPREXA) tablet 15 mg  15 mg Oral QHS Nkwenti, Doris, NP   15 mg at 03/17/21 2020   polyethylene glycol (MIRALAX / GLYCOLAX) packet 17 g  17 g Oral Daily PRN Nicholes Rough, NP        Lab Results: No results found for this or any previous visit (from the past 48 hour(s)).  Blood Alcohol level:  Lab Results  Component Value Date   ETH <10 03/05/2021   ETH <10 35/59/7416    Metabolic Disorder Labs: Lab Results  Component Value Date   HGBA1C 4.9 03/05/2021   MPG 93.93 03/05/2021   MPG 99.67 11/22/2020   Lab Results  Component Value Date   PROLACTIN 23.6 (H) 03/07/2021   PROLACTIN 27.6 (H) 11/27/2020   Lab Results  Component Value Date   CHOL 160 03/05/2021   TRIG 54 03/05/2021   HDL 61 03/05/2021   CHOLHDL 2.6 03/05/2021   VLDL 11 03/05/2021   LDLCALC  88 03/05/2021   LDLCALC 79 11/27/2020    Physical Findings: AIMS: Facial and Oral Movements Muscles of Facial Expression: None, normal Lips and Perioral Area: None, normal Jaw: None, normal Tongue: Minimal,Extremity Movements Upper (arms, wrists, hands, fingers): Minimal Lower (legs, knees, ankles, toes): None, normal, Trunk Movements Neck, shoulders, hips: None, normal, Overall Severity Severity of abnormal movements (highest score from questions above): None, normal Incapacitation due to abnormal movements: None, normal Patient's awareness of  abnormal movements (rate only patient's report): No Awareness, Dental Status Current problems with teeth and/or dentures?: No Does patient usually wear dentures?: No  CIWA:    COWS:     Musculoskeletal: Strength & Muscle Tone: within normal limits  Gait & Station: normal  Patient leans: N/A  Psychiatric Specialty Exam:  Presentation  General Appearance: Appropriate for Environment; Casual; Fairly Groomed  Eye Contact:Fair  Speech:Clear and Coherent  Speech Volume:Normal  Handedness:Right  Mood and Affect  Mood:Anxious; Depressed  Affect:Flat  Thought Process  Thought Processes:Goal Directed; Coherent; Linear  Descriptions of Associations:Intact  Orientation:Full (Time, Place and Person)  Thought Content:Logical; WDL  History of Schizophrenia/Schizoaffective disorder:Yes  Duration of Psychotic Symptoms:N/A  Hallucinations:Hallucinations: None Description of Auditory Hallucinations: -- (N/A) Description of Visual Hallucinations: -- (N/A)  Ideas of Reference:None  Suicidal Thoughts:Suicidal Thoughts: No  Homicidal Thoughts:Homicidal Thoughts: No  Sensorium  Memory:Immediate Fair; Recent Fair; Remote Fair  Judgment:Fair  Insight:Fair  Executive Functions  Concentration:Fair  Attention Span:Fair  [place]  Language:Good  Psychomotor Activity  Psychomotor  Activity:Psychomotor Activity: Normal  Assets  Assets:Communication Skills; Desire for Improvement; Housing; Physical Health; Social Support  Sleep  Sleep:Sleep: Good Number of Hours of Sleep: 8  Physical Exam: Physical Exam Constitutional:      Appearance: Normal appearance.  HENT:     Head: Normocephalic and atraumatic.     Right Ear: External ear normal.     Left Ear: External ear normal.     Nose: Nose normal.     Mouth/Throat:     Mouth: Mucous membranes are moist.  Eyes:     Extraocular Movements: Extraocular movements intact.     Conjunctiva/sclera: Conjunctivae normal.     Pupils: Pupils are equal, round, and reactive to light.  Cardiovascular:     Rate and Rhythm: Normal rate.     Pulses: Normal pulses.  Pulmonary:     Effort: Pulmonary effort is normal.  Abdominal:     Palpations: Abdomen is soft.  Genitourinary:    Comments: Deferred Musculoskeletal:        General: Normal range of motion.     Cervical back: Normal range of motion and neck supple.  Skin:    General: Skin is warm.  Neurological:     General: No focal deficit present.     Mental Status: She is alert and oriented to person, place, and time.  Psychiatric:        Behavior: Behavior normal.   Review of Systems  Constitutional:  Negative for chills, diaphoresis and fever.  HENT:  Negative for ear pain, hearing loss, sore throat and tinnitus.   Eyes:  Negative for blurred vision and double vision.  Respiratory: Negative.  Negative for cough, shortness of breath, wheezing and stridor.   Cardiovascular: Negative.   Gastrointestinal:  Positive for constipation. Negative for abdominal pain, diarrhea, heartburn, nausea and vomiting.  Genitourinary: Negative.  Negative for frequency and urgency.  Musculoskeletal: Negative.   Skin: Negative.  Negative for itching and rash.  Neurological:  Negative for dizziness, tingling, tremors, seizures, loss of consciousness and headaches.  Endo/Heme/Allergies:  Negative.        Bee vernom: Reaction Dermatitis Severity: Not specified  Psychiatric/Behavioral:  Positive for depression. Negative for hallucinations, memory loss, substance abuse and suicidal ideas. The patient is not nervous/anxious and does not have insomnia.   Blood pressure 109/65, pulse (!) 108, temperature 98.5 F (36.9 C), temperature source Oral, resp. rate 16, height $RemoveBe'5\' 7"'YkEhkCkNc$  (1.702 m), weight  77.8 kg, SpO2 97 %. Body mass index is 26.88 kg/m.  Treatment Plan Summary: Daily contact with patient to assess and evaluate symptoms and progress in treatment and Medication management  No change in treatment continue as below: Diagnoses / Active Problems: Bipolar I MRE manic with psychotic features by hx (r/o schizoaffective d/o bipolar type, r/o psychotic d/o due to general medical condition) UTI Hypokalemia Low Ceruloplasmin-Copper level not significant for Wilson's disease.  PLAN: Safety and Monitoring: -Voluntary admission to inpatient psychiatric unit for safety, stabilization and   treatment - if patient attempts to  leave will need IVC  -Daily contact with patient to assess and evaluate symptoms and progress in treatment             -Patient's case to be discussed in multi-disciplinary team meeting            - Observation Level : q15 minute checks             -Vital signs:  q12 hours             -Precautions: suicide   2. Psychiatric Diagnoses and Treatment:   Bipolar I Most Recent Episode (MRE) manic with psychotic features by hx (r/o schizoaffective   d/o bipolar type, r/o psychotic d/o due to general medical condition) -- Stopped Abilify due to abrupt decompensation with med change and report of akathisia on medication             -- Continue Zyprexa 15 mg qhs for Psychosis   -- Metabolic profile and EKG monitoring obtained while on an atypical antipsychotic (Lipid panel WNL,        A1c 4.9, QTC 446m)             -- Prolactin level 23.6 (was 27.6 3 months ago and 31.8 3  years ago) - will need f/u with PCP after discharge             -- Continue Cogentin 1 mg daily with AIMS monitoring             -- Due to oversedation, discontinued Trazodone PRN             -- Psychosis w/u: Head CT noncontrast negative;  ESR 1,  B12 264, HIV nonreactive, TSH 1.407, alcohol less than 10, CMP within normal limits other than a potassium of 3.0 on admission. Repeat CMP on 2/16 with Potassium at 4.1 (WNL). Glucose of 113, AST 15 and ALT 15, WBC 6.2, UDS negative, RPR non reactive, ceruloplasmin low at 16.2, heavy metal pending, and ANA negative.             -- Encouraged patient to participate in unit milieu and in scheduled group therapies                                  3. Medical Issues Being Addressed:              UTI             -- Macrobid 1064mbid for 7 days -completed  -- Preliminary culture results show 60,000 CFU E coli and >100,000 CFU streptococcus agalactiae-Pt treated with Macrobid 100 mg BID x 7 days (completed)               Hypokalemia - resolved             - Patient given K+ replacement and repeat BMP  shows K+ 4.1               Constipation             -- Discontinued colace 117m daily              --Continue to push po fluids             -- Continue Miralax 17g daily PRN                 Insomnia            -Start Melatonin 3 mg nightly               Low Ceruloplasmin             - 24 hour urine copper and serum copper levels as part of wilson's disease w/u completed-Copper level not significant for Wilson's disease   4. Discharge Planning:              -- Social work and case management to assist with discharge planning and identification of hospital follow-up needs prior to discharge             -- Discharge Concerns: Need to establish a safety plan; Medication compliance and effectiveness             -- Discharge Goals: Return home with outpatient referrals for mental health follow-up including medication management/psychotherapy      TLaretta Bolster FHosford2/27/2023, 2:32 PM

## 2021-03-18 NOTE — Progress Notes (Signed)
°   03/18/21 2005  Psych Admission Type (Psych Patients Only)  Admission Status Voluntary  Psychosocial Assessment  Patient Complaints None  Eye Contact Fair  Affect Appropriate to circumstance  Speech Logical/coherent  Interaction Assertive  Motor Activity Other (Comment) (unremarkable)  Appearance/Hygiene Unremarkable  Behavior Characteristics Cooperative;Appropriate to situation  Mood Pleasant;Euthymic  Thought Process  Coherency WDL  Content WDL  Delusions None reported or observed  Perception WDL  Hallucination None reported or observed  Judgment WDL  Confusion WDL  Danger to Self  Current suicidal ideation? Denies  Danger to Others  Danger to Others None reported or observed   Janet Welch was up and visible on the unit.  She attended evening wrap up group.  She took her hs medications without difficulty.  She denied any SI/HI or AVH.  She is looking forward to leaving tomorrow.  Q 15 minute checks maintained for safety.

## 2021-03-18 NOTE — BHH Counselor (Signed)
CSW met with patient and husband for family meeting.  Dr. Hill and NP Doris were in attendance.  CSW reviewed follow up appointments and providers discussed medication changes.  Husband agreed to pick up patient at noon.  Care team encouraged patient compliance to medications and follow up with therapy and med management.  ° ° ° , LCSW, LCAS °Clincal Social Worker  °[facility] ° °

## 2021-03-18 NOTE — Group Note (Signed)
Recreation Therapy Group Note   Group Topic:Stress Management  Group Date: 03/18/2021 Start Time: 0930 End Time: 0950 Facilitators: Victorino Sparrow, [place] Location: [address] Hall Dayroom   Goal Area(s) Addresses:  Patient will identify positive stress management techniques. Patient will identify benefits of using stress management post d/c.  Group Description:  Stress Release.  LRT played a meditation that focused on releasing stress through breathing and muscle tension and release.  Patients were to follow along as meditation played and go through the steps as the speaker was leading them through.  LRT and patients also discussed places like apps and Youtube  to access other stress management techniques.   Affect/Mood: Appropriate   Participation Level: Active   Participation Quality: Independent   Behavior: Appropriate   Speech/Thought Process: Focused   Insight: Good   Judgement: Good   Modes of Intervention: Meditation   Patient Response to Interventions:  Attentive   Education Outcome:  Acknowledges education and In group clarification offered    Clinical Observations/Individualized Feedback: Pt attended and participated in group.    Plan: Continue to engage patient in RT group sessions 2-3x/week.   Victorino Sparrow, LRT,CTRS 03/18/2021 1:08 PM

## 2021-03-18 NOTE — BHH Group Notes (Signed)
Orientation Group and Psychoeducational teaching. Patients were asked to share how they are feeling mentally today, and unit and ward rules, expectations were discussed. Psychoeducational teaching was then discussed with habits of mindfulness and positive reframing techniques to improve mental health. The patients were given a poem of the [place] and asked one technique to use to help calm anxiety and negative thoughts. The patient attended group.

## 2021-03-19 MED ORDER — MELATONIN 3 MG PO TABS: 3.0000 mg | ORAL_TABLET | Freq: Every day | ORAL | 0 refills | Status: DC

## 2021-03-19 MED ORDER — BENZTROPINE MESYLATE 1 MG PO TABS: 1.0000 mg | ORAL_TABLET | Freq: Every day | ORAL | 0 refills | Status: DC

## 2021-03-19 MED ORDER — OLANZAPINE 15 MG PO TABS: 15.0000 mg | ORAL_TABLET | Freq: Every day | ORAL | 0 refills | Status: DC

## 2021-03-19 MED ORDER — POLYETHYLENE GLYCOL 3350 17 G PO PACK: 17.0000 g | PACK | Freq: Every day | ORAL | 0 refills | Status: DC | PRN

## 2021-03-19 NOTE — Progress Notes (Signed)
°  [facility] Adult Case Management Discharge Plan :  Will you be returning to the same living situation after discharge:  Yes,  living with husband and kids At discharge, do you have transportation home?: Yes,  husband will be picking patient up Do you have the ability to pay for your medications: Yes,  insurance  Release of information consent forms completed and in the chart;  Patient's signature needed at discharge.  Patient to Follow up at:  Follow-up Information     [facility] Follow up on 04/02/2021.   Why: You have an appointment for medication management services on 04/02/21 at 9:30 am.  You also have an appointment for therapy services on 04/09/21 at 10:45 am, (please call periodically to check for a sooner appt, and please do make sure to attend this counseling appointment).  These appointments will be Virtual telehealth. Contact information: [address] [place], [place] [address]  PHONE: [phone] FAX: [phone]                Next level of care provider has access to [place] and Suicide Prevention discussed: Yes,  husband, [name]     Has patient been referred to the Quitline?: N/A patient is not a smoker  Patient has been referred for addiction treatment: [place], LCSW 03/19/2021, 10:14 AM

## 2021-03-19 NOTE — [facility] Suicide Risk Assessment (Addendum)
Suicide Risk Assessment  Discharge Assessment    [facility] Discharge Suicide Risk Assessment   Principal Problem: Bipolar I disorder, current or most recent episode manic, severe with mood-congruent psychotic features [facility]) Discharge Diagnoses: Principal Problem:   Bipolar I disorder, current or most recent episode manic, severe with mood-congruent psychotic features [facility])  Reason for Admission:  [facility]. [name] is a 46 y.o female with a history of bipolar disorder who was walked voluntarily to this [facility] by her husband on 03/04/21 for hallucinations and bizarre behaviors at home. This is pt's 3rd admission to this [facility]. Pt was admitted for treatment and stabilization of her mood.   [facility] COURSE: During the patient's hospitalization, patient had extensive initial psychiatric evaluation, and follow-up psychiatric evaluations every day. Psychiatric diagnoses & medications provided upon initial assessment:   Medications Bipolar I disorder, current or most recent episode manic, severe with mood-congruent psychotic features (HCC) -Start Zyprexa 5 mg BID   Insomnia -Start Trazodone 50 mg nightly PRN   Potassium of 3.0 -Give one time dose of K 40 MEQ & repeat CMP on 03/07/21   Anxiety -Continue Hydroxyzine 25 mg every 6 hours PRN   UTI -Start Macrobid 100 mg BID X 7days  During the hospitalization, other adjustments were made to the patient's psychiatric medication regimen.  -- Due to oversedation, discontinued Trazodone PRN. Psychosis work up was completed with results as follows: Head CT noncontrast negative;  ESR 1,  B12 264, HIV nonreactive, TSH 1.407, alcohol less than 10, CMP within normal limits other than a potassium of 3.0 on admission. Repeat CMP on 2/16 with Potassium at 4.1 (WNL). Glucose of 113, AST 15 and ALT 15, WBC 6.2, UDS negative, RPR non reactive, ceruloplasmin low at 16.2, heavy metal WNL, and ANA negative. Blood copper level was ordered due to slightly low ceruloplasmin level  to rule out Wilson's disease. Serum copper was slightly low at 78 (normal is 80-158 ug/dl). Pt has been educated to follow up with her primary care physician regarding the slightly low serum copper level and elevated Prolactin levels and has verbalized understanding. Urine copper level from a 24 hr urine was WNL. Pt was agreeable to a trial of Abilify during this hospitalization, but it was stopped due to abrupt decompensation with med change and report of akathisia on medication. Pt's final medication regimen at discharge is as follows: -Zyprexa 15 mg nightly for psychosis -Melatonin 3 mg nightly for insomnia -Miralax 17 g PRN daily for constipation -Cogentin 1 mg nightly for EPS prophylaxis Patient's care was discussed during the interdisciplinary team meeting every day during the hospitalization. The patient denies having side effects to prescribed psychiatric medication. The patient was evaluated each day by a clinical provider to ascertain response to treatment. Improvement was noted by the patient's report of decreasing symptoms, improved sleep and appetite, affect, medication tolerance, behavior, and participation in unit programming.  Patient was asked each day to complete a self inventory noting mood, mental status, pain, new symptoms, anxiety and concerns.   Symptoms were reported as significantly decreased or resolved completely by discharge.  The patient reports that their mood is stable.  The patient denied having suicidal thoughts for more than 48 hours prior to discharge.  Patient denies having homicidal thoughts.  Patient denies having auditory hallucinations.  Patient denies any visual hallucinations or other symptoms of psychosis.  The patient was motivated to continue taking medication with a goal of continued improvement in mental health.   The patient reports their  target psychiatric symptoms of paranoia responded well to the psychiatric medications, and the patient reports overall  benefit from this psychiatric hospitalization. Supportive psychotherapy was provided to the patient. The patient also participated in regular group therapy while hospitalized. Coping skills, problem solving as well as relaxation therapies were also part of the unit programming. Labs were reviewed with the patient, and abnormal results were discussed with the patient.  Total Time spent with patient: 30 minutes  Musculoskeletal: Strength & Muscle Tone: within normal limits Gait & Station: normal Patient leans: N/A  Psychiatric Specialty Exam  Presentation  General Appearance: Appropriate for Environment; Fairly Groomed  Eye Contact:Fair  Speech:Clear and Coherent  Speech Volume:Normal  Handedness:Right  Mood and Affect  Mood:Euthymic  Duration of Depression Symptoms: Greater than two weeks  Affect:Appropriate  Thought Process  Thought Processes:Coherent  Descriptions of Associations:Intact  Orientation:Full (Time, Place and Person)  Thought Content:Logical  History of Schizophrenia/Schizoaffective disorder:No  Duration of Psychotic Symptoms:Greater than six months  Hallucinations:Hallucinations: None Description of Auditory Hallucinations: -- (N/A) Description of Visual Hallucinations: -- (N/A)  Ideas of Reference:None  Suicidal Thoughts:Suicidal Thoughts: No  Homicidal Thoughts:Homicidal Thoughts: No  Sensorium  Memory:Immediate Good  Judgment:Good  Insight:Good  Executive Functions  Concentration:Good  Attention Span:Good  [place] of Knowledge:Good  Language:Good  Psychomotor Activity  Psychomotor Activity:Psychomotor Activity: Normal  Assets  Assets:Communication Skills; Housing; Social Support  Sleep  Sleep:Sleep: Good Number of Hours of Sleep: 8  Physical Exam: Physical Exam Constitutional:      Appearance: Normal appearance.  HENT:     Head: Normocephalic.     Right Ear: There is no impacted cerumen.     Nose: Nose  normal. No congestion or rhinorrhea.  Eyes:     Pupils: Pupils are equal, round, and reactive to light.  Musculoskeletal:        General: No swelling. Normal range of motion.     Cervical back: Normal range of motion. No rigidity.  Neurological:     Mental Status: She is alert and oriented to person, place, and time.     Sensory: No sensory deficit.     Coordination: Coordination normal.  Psychiatric:        Behavior: Behavior normal.        Thought Content: Thought content normal.   Review of Systems  Constitutional:  Negative for fever.  HENT: Negative.    Respiratory: Negative.  Negative for cough, shortness of breath and wheezing.   Cardiovascular: Negative.  Negative for chest pain.  Gastrointestinal: Negative.  Negative for heartburn and nausea.  Genitourinary: Negative.   Musculoskeletal: Negative.   Skin: Negative.   Neurological: Negative.   Endo/Heme/Allergies:        Allergies: Bee Venon  Psychiatric/Behavioral:  Hallucinations: paranoia, resolving with medications. Insomnia: insomnia is resolving with Melatonin.   Blood pressure (!) 112/57, pulse (!) 106, temperature 97.8 F (36.6 C), temperature source Oral, resp. rate 16, height _0  (1.702 m), weight 77.8 kg, SpO2 96 %. Body mass index is 26.88 kg/m.  Mental Status Per Nursing Assessment::   On Admission:  NA  Demographic Factors:  Caucasian  Loss Factors: NA  Historical Factors: NA  Risk Reduction Factors:   Positive social support  Continued Clinical Symptoms:  Bipolar Disorder:   Depressive phase Pt's symptoms are resolving with current medication regimen  Cognitive Features That Contribute To Risk:  None    Suicide Risk:  Minimal: No identifiable suicidal ideation.  Patients presenting with no risk factors but  with morbid ruminations; may be classified as minimal risk based on the severity of the depressive symptoms   Follow-up Information     [facility] Follow up on 04/02/2021.    Why: You have an appointment for medication management services on 04/02/21 at 9:30 am.  You also have an appointment for therapy services on 04/09/21 at 10:45 am, (please call periodically to check for a sooner appt, and please do make sure to attend this counseling appointment).  These appointments will be Virtual telehealth. Contact information: [address] [place], [place] [address]  PHONE: [phone] FAX: [phone]               Plan Of Care/Follow-up recommendations:  The patient is able to verbalize their individual safety plan to this provider.  # It is recommended to the patient to continue psychiatric medications as prescribed, after discharge from the [facility].    # It is recommended to the patient to follow up with your outpatient psychiatric provider and PCP.  # It was discussed with the patient, the impact of alcohol, drugs, tobacco have been there overall psychiatric and medical wellbeing, and total abstinence from substance use was recommended the patient.ed.  # Prescriptions provided or sent directly to preferred pharmacy at discharge. Patient agreeable to plan. Given opportunity to ask questions. Appears to feel comfortable with discharge.    # In the event of worsening symptoms, the patient is instructed to call the crisis hotline, 911 and or go to the nearest ED for appropriate evaluation and treatment of symptoms. To follow-up with primary care provider for other medical issues, concerns and or health care needs  # Patient was discharged home with a plan to follow up as noted above.   Nicholes Rough, NP 03/19/2021, 11:49 AM

## 2021-03-19 NOTE — Progress Notes (Signed)
Patient ID: Janet [facility], female   DOB: 1975-06-03, 46 y.o.   MRN: [id number]   Pt ambulatory, alert, and oriented on and off the unit. Education, support, and encouragement provided. Discharge summary/AVS, prescriptions, medications, and follow up appointments reviewed with pt and a copy of the AVS was given to pt. Suicide prevention resources provided. Pt's belongings in locker # 11 returned and belongings sheet signed. Pt denies SI/HI, A/VH, pain, or any concerns at this time. Pt discharged to lobby to be transported to her destination.

## 2021-03-19 NOTE — Discharge Summary (Signed)
Physician Discharge Summary Note  Patient:  Janet [facility] is an 46 y.o., female MRN:  [id number] DOB:  [birth year] Patient phone:  [phone] (home)  Patient address:   [address] [place] [address],  Total Time spent with patient: 30 minutes  Date of Admission:  03/04/2021 Date of Discharge: 03/19/2021  Reason for Admission:   Janet [facility] is a 46 y.o female with a history of bipolar disorder who was walked voluntarily to this [facility] by her husband on 03/04/21 for hallucinations and bizarre behaviors at home. This is pt's 3rd admission to this [facility]. Pt was admitted for treatment and stabilization of her mood.    Principal Problem: Bipolar I disorder, current or most recent episode manic, severe with mood-congruent psychotic features [facility]) Discharge Diagnoses: Principal Problem:   Bipolar I disorder, current or most recent episode manic, severe with mood-congruent psychotic features ([place])  Past Psychiatric History: As above  Past Medical History:  Past Medical History:  Diagnosis Date   Bipolar 1 disorder ([place])    No pertinent past medical history     Past Surgical History:  Procedure Laterality Date   CESAREAN SECTION N/A 07/07/2013   Procedure: CESAREAN SECTION;  Surgeon: Linda Hedges, DO;  Location: [facility];  Service: Obstetrics;  Laterality: N/A;   LEG SKIN LESION  BIOPSY / EXCISION     scar tissue biopsy     Family History:  Family History  Problem Relation Age of Onset   Thyroid disease Mother    Mental illness Mother    Parkinson's disease Father    Stroke Maternal Grandmother    Stroke Maternal Grandfather    Family Psychiatric  History: As above Social History:  Social History   Substance and Sexual Activity  Alcohol Use Yes   Alcohol/week: 0.0 - 1.0 standard drinks   Comment: socially     Social History   Substance and Sexual Activity  Drug Use Not Currently   Types: Marijuana    Social History   Socioeconomic History   Marital status:  Married    Spouse name: Not on file   Number of children: Not on file   Years of education: Not on file   Highest education level: Not on file  Occupational History    Comment: [employer]  Tobacco Use   Smoking status: Never   Smokeless tobacco: Never  Vaping Use   Vaping Use: Never used  Substance and Sexual Activity   Alcohol use: Yes    Alcohol/week: 0.0 - 1.0 standard drinks    Comment: socially   Drug use: Not Currently    Types: Marijuana   Sexual activity: Yes    Birth control/protection: None  Other Topics Concern   Not on file  Social History Narrative   Not on file   Social Determinants of Health   Financial Resource Strain: Not on file  Food Insecurity: Not on file  Transportation Needs: Not on file  Physical Activity: Not on file  Stress: Not on file  Social Connections: Not on file   Hospital Course:   During the patient's hospitalization, patient had extensive initial psychiatric evaluation, and follow-up psychiatric evaluations every day. Psychiatric diagnoses & medications provided upon initial assessment:   Medications Bipolar I disorder, current or most recent episode manic, severe with mood-congruent psychotic features (HCC) -Start Zyprexa 5 mg BID   Insomnia -Start Trazodone 50 mg nightly PRN   Potassium of 3.0 -Give one time dose of K 40 MEQ & repeat  CMP on 03/07/21   Anxiety -Continue Hydroxyzine 25 mg every 6 hours PRN   UTI -Start Macrobid 100 mg BID X 7days   During the hospitalization, other adjustments were made to the patient's psychiatric medication regimen.  -- Due to oversedation, discontinued Trazodone PRN. Psychosis work up was completed with results as follows: Head CT noncontrast negative;  ESR 1,  B12 264, HIV nonreactive, TSH 1.407, alcohol less than 10, CMP within normal limits other than a potassium of 3.0 on admission. Repeat CMP on 2/16 with Potassium at 4.1 (WNL). Glucose of 113, AST 15 and ALT 15, WBC 6.2, UDS negative,  RPR non reactive, ceruloplasmin low at 16.2, heavy metal WNL, and ANA negative. Blood copper level was ordered due to slightly low ceruloplasmin level to rule out Wilson's disease. Serum copper was slightly low at 78 (normal is 80-158 ug/dl). Pt has been educated to follow up with her primary care physician regarding the slightly low serum copper level and has verbalized understanding. Urine copper level from a 24 hr urine was WNL. Pt was agreeable to a trial of Abilify during this hospitalization, but it was stopped due to abrupt decompensation with med change and report of akathisia on medication. Pt's final medication regimen at discharge is as follows: -Zyprexa 15 mg nightly for psychosis -Melatonin 3 mg nightly for insomnia -Miralax 17 g PRN daily for constipation -Cogentin 1 mg nightly for EPS prophylaxis Patient's care was discussed during the interdisciplinary team meeting every day during the hospitalization. The patient denies having side effects to prescribed psychiatric medication. The patient was evaluated each day by a clinical provider to ascertain response to treatment. Improvement was noted by the patient's report of decreasing symptoms, improved sleep and appetite, affect, medication tolerance, behavior, and participation in unit programming.  Patient was asked each day to complete a self inventory noting mood, mental status, pain, new symptoms, anxiety and concerns.   Symptoms were reported as significantly decreased or resolved completely by discharge.  The patient reports that their mood is stable.  The patient denied having suicidal thoughts for more than 48 hours prior to discharge.  Patient denies having homicidal thoughts.  Patient denies having auditory hallucinations.  Patient denies any visual hallucinations or other symptoms of psychosis.  The patient was motivated to continue taking medication with a goal of continued improvement in mental health.    The patient reports  their target psychiatric symptoms of paranoia responded well to the psychiatric medications, and the patient reports overall benefit from this psychiatric hospitalization. Supportive psychotherapy was provided to the patient. The patient also participated in regular group therapy while hospitalized. Coping skills, problem solving as well as relaxation therapies were also part of the unit programming. Labs were reviewed with the patient, and abnormal results were discussed with the patient.  Physical Findings: AIMS: Facial and Oral Movements Muscles of Facial Expression: None, normal Lips and Perioral Area: None, normal Jaw: None, normal Tongue: Minimal,Extremity Movements Upper (arms, wrists, hands, fingers): Minimal Lower (legs, knees, ankles, toes): None, normal, Trunk Movements Neck, shoulders, hips: None, normal, Overall Severity Severity of abnormal movements (highest score from questions above): None, normal Incapacitation due to abnormal movements: None, normal Patient's awareness of abnormal movements (rate only patient's report): No Awareness, Dental Status Current problems with teeth and/or dentures?: No Does patient usually wear dentures?: No  CIWA:  n/a COWS: n/a  Musculoskeletal: Strength & Muscle Tone: within normal limits Gait & Station: normal Patient leans: N/A  Psychiatric Specialty Exam:  Presentation  General Appearance: Appropriate for Environment; Fairly Groomed  Eye Contact:Fair  Speech:Clear and Coherent  Speech Volume:Normal  Handedness:Right  Mood and Affect  Mood:Euthymic  Affect:Appropriate  Thought Process  Thought Processes:Coherent  Descriptions of Associations:Intact  Orientation:Full (Time, Place and Person)  Thought Content:Logical  History of Schizophrenia/Schizoaffective disorder:No  Duration of Psychotic Symptoms:Greater than six months  Hallucinations:Hallucinations: None Description of Auditory Hallucinations: --  (N/A) Description of Visual Hallucinations: -- (N/A)  Ideas of Reference:None  Suicidal Thoughts:Suicidal Thoughts: No  Homicidal Thoughts:Homicidal Thoughts: No  Sensorium  Memory:Immediate Good  Judgment:Good  Insight:Good  Executive Functions  Concentration:Good  Attention Span:Good  [place] of Knowledge:Good  Language:Good  Psychomotor Activity  Psychomotor Activity:Psychomotor Activity: Normal  Assets  Assets:Communication Skills; Housing; Social Support  Sleep  Sleep:Sleep: Good Number of Hours of Sleep: 8  Physical Exam: Physical Exam Constitutional:      General: She is not in acute distress. HENT:     Head: Normocephalic.     Nose: Nose normal. No congestion or rhinorrhea.  Eyes:     Pupils: Pupils are equal, round, and reactive to light.  Pulmonary:     Effort: Pulmonary effort is normal.  Musculoskeletal:     Cervical back: Normal range of motion. No rigidity.  Neurological:     General: No focal deficit present.     Mental Status: She is alert and oriented to person, place, and time.  Psychiatric:        Behavior: Behavior normal.        Thought Content: Thought content normal.   Review of Systems  Constitutional: Negative.  Negative for fever.  HENT: Negative.    Eyes: Negative.   Respiratory: Negative.  Negative for cough.   Cardiovascular: Negative.  Negative for chest pain.  Gastrointestinal: Negative.  Negative for heartburn.  Genitourinary: Negative.   Musculoskeletal: Negative.   Skin: Negative.   Neurological: Negative.   Endo/Heme/Allergies:        Allergies: Bee Venon  Psychiatric/Behavioral:  Negative for depression. Hallucinations: paranoia is improving with medications.  Blood pressure (!) 112/57, pulse (!) 106, temperature 97.8 F (36.6 C), temperature source Oral, resp. rate 16, height 5' 7"  (1.702 m), weight 77.8 kg, SpO2 96 %. Body mass index is 26.88 kg/m.   Social History   Tobacco Use  Smoking  Status Never  Smokeless Tobacco Never   Tobacco Cessation:  N/A, patient does not currently use tobacco products  Blood Alcohol level:  Lab Results  Component Value Date   ETH <10 03/05/2021   ETH <10 93/57/0177   Metabolic Disorder Labs:  Lab Results  Component Value Date   HGBA1C 4.9 03/05/2021   MPG 93.93 03/05/2021   MPG 99.67 11/22/2020   Lab Results  Component Value Date   PROLACTIN 23.6 (H) 03/07/2021   PROLACTIN 27.6 (H) 11/27/2020   Lab Results  Component Value Date   CHOL 160 03/05/2021   TRIG 54 03/05/2021   HDL 61 03/05/2021   CHOLHDL 2.6 03/05/2021   VLDL 11 03/05/2021   LDLCALC 88 03/05/2021   LDLCALC 79 11/27/2020   See Psychiatric Specialty Exam and Suicide Risk Assessment completed by Attending Physician prior to discharge.  Discharge destination:  Home  Is patient on multiple antipsychotic therapies at discharge:  No   Has Patient had three or more failed trials of antipsychotic monotherapy by history:  No  Recommended Plan for Multiple Antipsychotic Therapies: NA   Allergies as of 03/19/2021  Reactions   Bee Venom Dermatitis        Medication List     STOP taking these medications    eszopiclone 2 MG Tabs tablet Commonly known as: Lunesta   Magnesium 200 MG Tabs   traZODone 50 MG tablet Commonly known as: DESYREL       TAKE these medications      Indication  benztropine 1 MG tablet Commonly known as: COGENTIN Take 1 tablet (1 mg total) by mouth at bedtime.  Indication: Extrapyramidal Reaction caused by Medications   melatonin 3 MG Tabs tablet Take 1 tablet (3 mg total) by mouth at bedtime.  Indication: Trouble Sleeping   OLANZapine 15 MG tablet Commonly known as: ZYPREXA Take 1 tablet (15 mg total) by mouth at bedtime. What changed:  medication strength how much to take  Indication: Depressive Phase of Manic-Depression, Psychosis   polyethylene glycol 17 g packet Commonly known as: MIRALAX / GLYCOLAX Take  17 g by mouth daily as needed for moderate constipation.  Indication: Constipation        Follow-up Information     [facility] Follow up on 04/02/2021.   Why: You have an appointment for medication management services on 04/02/21 at 9:30 am.  You also have an appointment for therapy services on 04/09/21 at 10:45 am, (please call periodically to check for a sooner appt, and please do make sure to attend this counseling appointment).  These appointments will be Virtual telehealth. Contact information: [address] [place], [place] [address]  PHONE: [phone] FAX: [phone]               Follow-up recommendations:   The patient is able to verbalize their individual safety plan to this provider.   # It is recommended to the patient to continue psychiatric medications as prescribed, after discharge from the hospital.     # It is recommended to the patient to follow up with your outpatient psychiatric provider and PCP.   # It was discussed with the patient, the impact of alcohol, drugs, tobacco have been there overall psychiatric and medical wellbeing, and total abstinence from substance use was recommended the patient.ed.   # Prescriptions provided or sent directly to preferred pharmacy at discharge. Patient agreeable to plan. Given opportunity to ask questions. Appears to feel comfortable with discharge.    # In the event of worsening symptoms, the patient is instructed to call the crisis hotline, 911 and or go to the nearest ED for appropriate evaluation and treatment of symptoms. To follow-up with primary care provider for other medical issues, concerns and or health care needs   # Patient was discharged home with a plan to follow up as noted above.   Signed: Nicholes Rough, NP 03/19/2021, 12:04 PM

## 2021-03-20 ENCOUNTER — Other Ambulatory Visit: Payer: Self-pay

## 2021-03-20 ENCOUNTER — Encounter: Payer: Self-pay | Admitting: Nurse Practitioner

## 2021-03-20 ENCOUNTER — Ambulatory Visit (INDEPENDENT_AMBULATORY_CARE_PROVIDER_SITE_OTHER): Payer: 59 | Admitting: Nurse Practitioner

## 2021-03-20 VITALS — BP 122/70 | HR 76 | Temp 97.6°F | Ht 67.0 in | Wt 168.0 lb

## 2021-03-20 DIAGNOSIS — F312 Bipolar disorder, current episode manic severe with psychotic features: Secondary | ICD-10-CM

## 2021-03-20 DIAGNOSIS — K5900 Constipation, unspecified: Secondary | ICD-10-CM

## 2021-03-20 DIAGNOSIS — G4700 Insomnia, unspecified: Secondary | ICD-10-CM

## 2021-03-20 DIAGNOSIS — Z1231 Encounter for screening mammogram for malignant neoplasm of breast: Secondary | ICD-10-CM

## 2021-03-20 DIAGNOSIS — F322 Major depressive disorder, single episode, severe without psychotic features: Secondary | ICD-10-CM | POA: Diagnosis not present

## 2021-03-20 DIAGNOSIS — Z1211 Encounter for screening for malignant neoplasm of colon: Secondary | ICD-10-CM

## 2021-03-20 NOTE — Patient Instructions (Signed)

## 2021-03-20 NOTE — Progress Notes (Signed)
?Industrial/product designer as a Education administrator for [employer], FNP.,have documented all relevant documentation on the behalf of Minette Brine, FNP,as directed by  Minette Brine, FNP while in the presence of Minette Brine, [place]. ? ?This visit occurred during the SARS-CoV-2 public health emergency.  Safety protocols were in place, including screening questions prior to the visit, additional usage of staff PPE, and extensive cleaning of exam room while observing appropriate contact time as indicated for disinfecting solutions. ? ?Subjective:  ?  ? Patient ID: Janet Welch , female    DOB: [birth year] , 46 y.o.   MRN: [id number] ? ? ?Chief Complaint  ?Patient presents with  ? Insomnia  ? ? ?HPI ? ?She has recently been discharged from [facility], she had been having hallucinations. She is planning to get a psychiatrist/counselor in person. She is planning to get therapy and manage medications. Her sleep pattern was fair.  ? ?Insomnia ?Primary symptoms: fragmented sleep, sleep disturbance, no malaise/fatigue.   ?The current episode started more than one year. The onset quality is sudden. The symptoms are aggravated by anxiety. Typical bedtime:  8-10 P.M..  PMH includes: depression.    ? ?Past Medical History:  ?Diagnosis Date  ? Bipolar 1 disorder ([place])   ? No pertinent past medical history   ?  ? ?Family History  ?Problem Relation Age of Onset  ? Thyroid disease Mother   ? Mental illness Mother   ? Parkinson's disease Father   ? Stroke Maternal Grandmother   ? Stroke Maternal Grandfather   ? ? ? ?Current Outpatient Medications:  ?  benztropine (COGENTIN) 1 MG tablet, Take 1 tablet (1 mg total) by mouth at bedtime., Disp: 30 tablet, Rfl: 0 ?  melatonin 3 MG TABS tablet, Take 1 tablet (3 mg total) by mouth at bedtime., Disp: 30 tablet, Rfl: 0 ?  OLANZapine (ZYPREXA) 15 MG tablet, Take 1 tablet (15 mg total) by mouth at bedtime., Disp: 30 tablet, Rfl: 0 ?  polyethylene glycol (MIRALAX / GLYCOLAX) 17 g packet, Take 17 g by mouth  daily as needed for moderate constipation., Disp: 30 each, Rfl: 0  ? ?Allergies  ?Allergen Reactions  ? Bee Venom Dermatitis  ?  ? ?Review of Systems  ?Constitutional: Negative.  Negative for malaise/fatigue.  ?Respiratory: Negative.    ?Cardiovascular: Negative.   ?Gastrointestinal: Negative.   ?Neurological: Negative.   ?Psychiatric/Behavioral:  Positive for depression and sleep disturbance. The patient has insomnia.    ? ?Today's Vitals  ? 03/20/21 1542  ?BP: 122/70  ?Pulse: 76  ?Temp: 97.6 ?F (36.4 ?C)  ?TempSrc: Oral  ?Weight: 168 lb (76.2 kg)  ?Height: 5\' 7"  (1.702 m)  ? ?Body mass index is 26.31 kg/m?.  ? ?Objective:  ?Physical Exam ?Vitals reviewed.  ?Constitutional:   ?   General: She is not in acute distress. ?   Appearance: Normal appearance.  ?Cardiovascular:  ?   Pulses: Normal pulses.  ?   Heart sounds: Normal heart sounds. No murmur heard. ?Pulmonary:  ?   Effort: Pulmonary effort is normal. No respiratory distress.  ?   Breath sounds: Normal breath sounds. No wheezing.  ?Neurological:  ?   General: No focal deficit present.  ?   Mental Status: She is alert and oriented to person, place, and time.  ?   Cranial Nerves: No cranial nerve deficit.  ?   Motor: No weakness.  ?Psychiatric:     ?   Mood and Affect: Mood normal.     ?  Behavior: Behavior normal.     ?   Thought Content: Thought content normal.     ?   Judgment: Judgment normal.  ?   Comments: Her appearance is much better  ?  ? ?   ?Assessment And Plan:  ?   ?1. Insomnia, unspecified type ?Comments: Reports she is doing better since her stay at [facility] but has only been home one night, continue melatonin and hold on magnesium.  ? ?2. MDD (major depressive disorder), severe ([place]) ?Comments: Continue follow up with psychiatry/counseling. She was recently discharged from behavioral health ? ?3. Bipolar I disorder, current or most recent episode manic, severe with mood-congruent psychotic features ([place]) ?Comments: Continue follow up  with psychiatry/counseling.  ? ?4. Encounter for screening mammogram for breast cancer ?Pt instructed on Self Breast Exam.According to ACOG guidelines Women aged 76 and older are recommended to get an annual mammogram. Form completed and given to patient contact the [facility] for appointment scheduing.  ?Pt encouraged to get annual mammogram ?- MM Digital Screening; Future ? ?5. Encounter for screening colonoscopy ?According to USPTF Colorectal cancer Screening guidelines. Colonoscopy is recommended every 10 years, starting at age 49 years. ?Will refer to GI for colon cancer screening. ?- Ambulatory referral to Gastroenterology ? ?6. Constipation, unspecified constipation type ?Comments: She is to hold her magnesium to see if this improves and had been taking miralax as needed while at [facility] ?  ? ? ?Patient was given opportunity to ask questions. Patient verbalized understanding of the plan and was able to repeat key elements of the plan. All questions were answered to their satisfaction.  ?Minette Brine, FNP  ? ?I, Minette Brine, FNP, have reviewed all documentation for this visit. The documentation on 03/20/21 for the exam, diagnosis, procedures, and orders are all accurate and complete.  ? ?IF YOU HAVE BEEN REFERRED TO A SPECIALIST, IT MAY TAKE 1-2 WEEKS TO SCHEDULE/PROCESS THE REFERRAL. IF YOU HAVE NOT HEARD FROM US/SPECIALIST IN TWO WEEKS, PLEASE GIVE [place] A CALL AT [phone].  ? ?THE PATIENT IS ENCOURAGED TO PRACTICE SOCIAL DISTANCING DUE TO THE COVID-19 PANDEMIC.   ?

## 2021-04-02 ENCOUNTER — Encounter: Payer: Self-pay | Admitting: Nurse Practitioner

## 2021-04-02 ENCOUNTER — Telehealth (INDEPENDENT_AMBULATORY_CARE_PROVIDER_SITE_OTHER): Payer: 59 | Admitting: Nurse Practitioner

## 2021-04-02 ENCOUNTER — Other Ambulatory Visit: Payer: Self-pay

## 2021-04-02 DIAGNOSIS — D229 Melanocytic nevi, unspecified: Secondary | ICD-10-CM | POA: Diagnosis not present

## 2021-04-02 DIAGNOSIS — Z808 Family history of malignant neoplasm of other organs or systems: Secondary | ICD-10-CM

## 2021-04-02 NOTE — Progress Notes (Signed)
? ?Virtual Visit via [place]  ? ?This visit type was conducted due to national recommendations for restrictions regarding the COVID-19 Pandemic (e.g. social distancing) in an effort to limit this patient's exposure and mitigate transmission in our community.  Due to her co-morbid illnesses, this patient is at least at moderate risk for complications without adequate follow up.  This format is felt to be most appropriate for this patient at this time.  All issues noted in this document were discussed and addressed.  A limited physical exam was performed with this format.   ? ?This visit type was conducted due to national recommendations for restrictions regarding the COVID-19 Pandemic (e.g. social distancing) in an effort to limit this patient's exposure and mitigate transmission in our community.  Patients identity confirmed using two different identifiers.  This format is felt to be most appropriate for this patient at this time.  All issues noted in this document were discussed and addressed.  No physical exam was performed (except for noted visual exam findings with Video Visits).   ? ?Date:  04/02/2021  ? ?ID:  Janet Welch, DOB 08/09/1975, MRN [id number] ? ?Patient Location:  ?Parked car - spoke with Arvil Chaco ? ?Provider location:   ?Office ? ? ? ?Chief Complaint:  mole changing ? ?History of Present Illness:   ? ?Janet Welch is a 45 y.o. female who presents via video conferencing for a telehealth visit today.   ? ?The patient does not have symptoms concerning for COVID-19 infection (fever, chills, cough, or new shortness of breath).  ? ?Pt has a mole on right arm she states " looks funny". She does have a family history of skin cancer.  She has a mole to the posterior right arm. She was going to [facility] but they no longer take her insurance. Denies itching. No bleeding. This mole is beginning to change.  ?  ? ?Past Medical History:  ?Diagnosis Date  ? Bipolar 1 disorder ([place])   ? No pertinent  past medical history   ? ?Past Surgical History:  ?Procedure Laterality Date  ? CESAREAN SECTION N/A 07/07/2013  ? Procedure: CESAREAN SECTION;  Surgeon: Linda Hedges, DO;  Location: [facility];  Service: Obstetrics;  Laterality: N/A;  ? LEG SKIN LESION  BIOPSY / EXCISION    ? scar tissue biopsy    ?  ? ?Current Meds  ?Medication Sig  ? benztropine (COGENTIN) 1 MG tablet Take 1 tablet (1 mg total) by mouth at bedtime.  ? melatonin 3 MG TABS tablet Take 1 tablet (3 mg total) by mouth at bedtime.  ? OLANZapine (ZYPREXA) 15 MG tablet Take 1 tablet (15 mg total) by mouth at bedtime.  ? polyethylene glycol (MIRALAX / GLYCOLAX) 17 g packet Take 17 g by mouth daily as needed for moderate constipation.  ?  ? ?Allergies:   Bee venom  ? ?Social History  ? ?Tobacco Use  ? Smoking status: Never  ? Smokeless tobacco: Never  ?Vaping Use  ? Vaping Use: Never used  ?Substance Use Topics  ? Alcohol use: Yes  ?  Alcohol/week: 0.0 - 1.0 standard drinks  ?  Comment: socially  ? Drug use: Not Currently  ?  Types: Marijuana  ?  ? ?Family Hx: ?The patient's family history includes Mental illness in her mother; Parkinson's disease in her father; Stroke in her maternal grandfather and maternal grandmother; Thyroid disease in her mother. ? ?ROS:   ?Please see the history of present illness.    ?  Review of Systems  ?Constitutional: Negative.   ?HENT: Negative.    ?Respiratory: Negative.    ?Gastrointestinal: Negative.   ?Neurological: Negative.   ?Endo/Heme/Allergies: Negative.   ?Psychiatric/Behavioral: Negative.     ?All other systems reviewed and are negative. ? ? ?Labs/Other Tests and Data Reviewed:   ? ?Recent Labs: ?03/05/2021: ALT 15; Hemoglobin 14.1; Platelets 283; TSH 1.407 ?03/07/2021: BUN 14; Creatinine, Ser 0.88; Potassium 4.1; Sodium 140  ? ?Recent Lipid Panel ?Lab Results  ?Component Value Date/Time  ? CHOL 160 03/05/2021 06:21 AM  ? TRIG 54 03/05/2021 06:21 AM  ? HDL 61 03/05/2021 06:21 AM  ? CHOLHDL 2.6 03/05/2021 06:21 AM  ? LDLCALC  88 03/05/2021 06:21 AM  ? LDLCALC 81 07/14/2018 09:23 AM  ? ? ?Wt Readings from Last 3 Encounters:  ?03/20/21 168 lb (76.2 kg)  ?02/20/21 173 lb (78.5 kg)  ?01/04/21 187 lb (84.8 kg)  ?  ? ?Exam:   ? ?Vital Signs:  There were no vitals taken for this visit.  ? ? ?Physical Exam ?Vitals reviewed.  ?Constitutional:   ?   General: She is not in acute distress. ?   Appearance: Normal appearance.  ?Pulmonary:  ?   Effort: Pulmonary effort is normal. No respiratory distress.  ?Neurological:  ?   General: No focal deficit present.  ?   Mental Status: She is alert and oriented to person, place, and time. Mental status is at baseline.  ?   Cranial Nerves: No cranial nerve deficit.  ?Psychiatric:     ?   Mood and Affect: Mood and affect normal.     ?   Behavior: Behavior normal.     ?   Thought Content: Thought content normal.     ?   Cognition and Memory: Memory normal.     ?   Judgment: Judgment normal.  ? ? ?ASSESSMENT & PLAN:   ? ?1. Nevus ?Right posterior upper arm with increasing in size nevus slight irregular shape. Will refer to Dermatology for further evaluation ? ?2. Family history of skin cancer ? ? ? ?COVID-19 Education: ?The signs and symptoms of COVID-19 were discussed with the patient and how to seek care for testing (follow up with PCP or arrange E-visit).  The importance of social distancing was discussed today. ? ?Patient Risk:   ?After full review of this patients clinical status, I feel that they are at least moderate risk at this time. ? ?Time:   ?Today, I have spent 4 minutes/ seconds with the patient with telehealth technology discussing above diagnoses.   ? ? ?Medication Adjustments/Labs and Tests Ordered: ?Current medicines are reviewed at length with the patient today.  Concerns regarding medicines are outlined above.  ? ?Tests Ordered: ?Orders Placed This Encounter  ?Procedures  ? Ambulatory referral to Dermatology  ? ? ?Medication Changes: ?No orders of the defined types were placed in this  encounter. ? ? ?Disposition:  Follow up prn ? ?Signed, ?Minette Brine, FNP  ?  ?

## 2021-05-06 ENCOUNTER — Ambulatory Visit
Admission: RE | Admit: 2021-05-06 | Discharge: 2021-05-06 | Disposition: A | Discharge status: Home / Self Care | Payer: Medicaid Other | Source: Ambulatory Visit | Attending: Nurse Practitioner | Admitting: Nurse Practitioner

## 2021-05-06 DIAGNOSIS — Z1231 Encounter for screening mammogram for malignant neoplasm of breast: Secondary | ICD-10-CM

## 2021-05-14 LAB — HM COLONOSCOPY

## 2021-05-29 ENCOUNTER — Ambulatory Visit: Payer: Self-pay | Admitting: Nurse Practitioner

## 2021-06-14 ENCOUNTER — Encounter: Payer: Self-pay | Admitting: Nurse Practitioner

## 2021-07-02 ENCOUNTER — Encounter: Payer: 59 | Admitting: Nurse Practitioner

## 2021-07-02 ENCOUNTER — Other Ambulatory Visit: Payer: 59

## 2021-07-11 ENCOUNTER — Encounter: Payer: 59 | Admitting: Nurse Practitioner

## 2021-12-17 ENCOUNTER — Telehealth: Payer: Self-pay

## 2021-12-17 NOTE — Telephone Encounter (Signed)
Pt called requesting a refill on Clonazepam. Pt is no longer a patient of [facility] and currently sees Minette Brine, FNP. No refill sent. MJP,LPN

## 2022-04-22 ENCOUNTER — Encounter: Payer: Medicaid Other | Admitting: Nurse Practitioner

## 2022-11-01 IMAGING — CT CT HEAD W/O CM
4 series · 17 of 47 positions shown, 19 images · non-contrast
Comparison: None.

CLINICAL DATA: Psychosis.



[Series 2: head wo · axial · 0.47mm/px · z∈[+1311,+1441]mm · 7 of 36 slices shown, 9 images]
[im 5/36  brain]
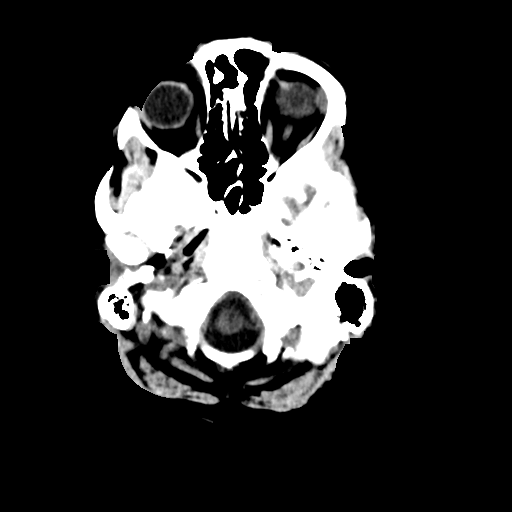
[im 5/36  bone]
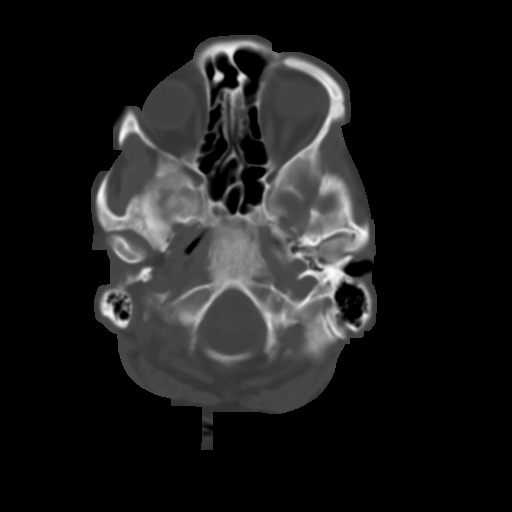
[im 9/36  brain]
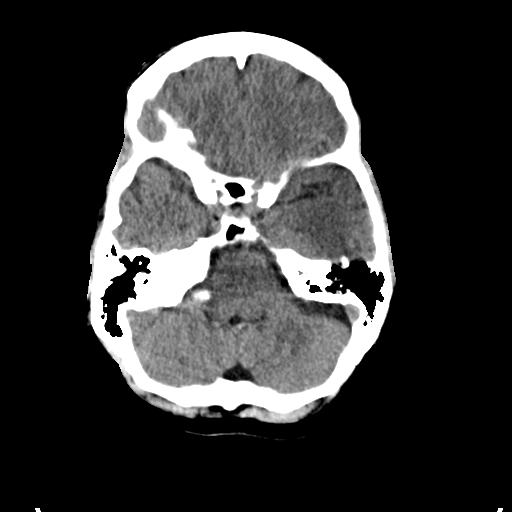
[im 14/36  brain]
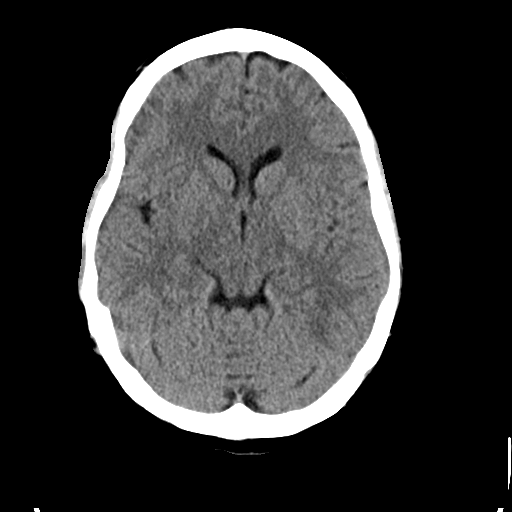
[im 18/36  brain]
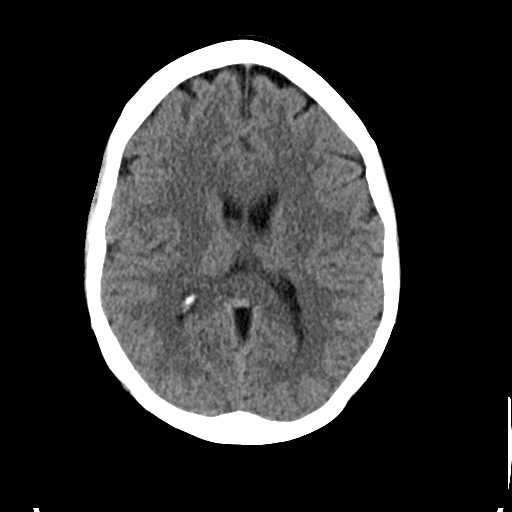
[im 22/36  brain]
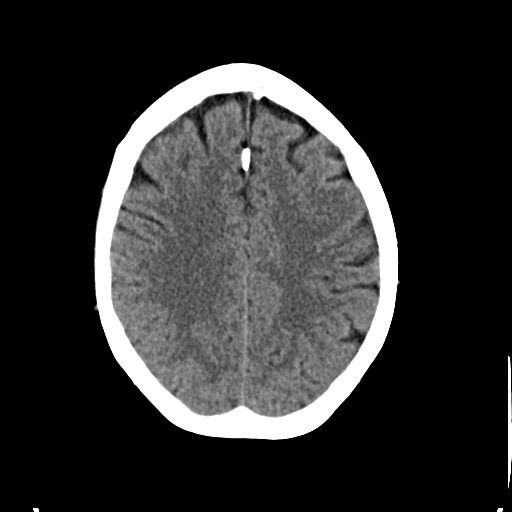
[im 22/36  bone]
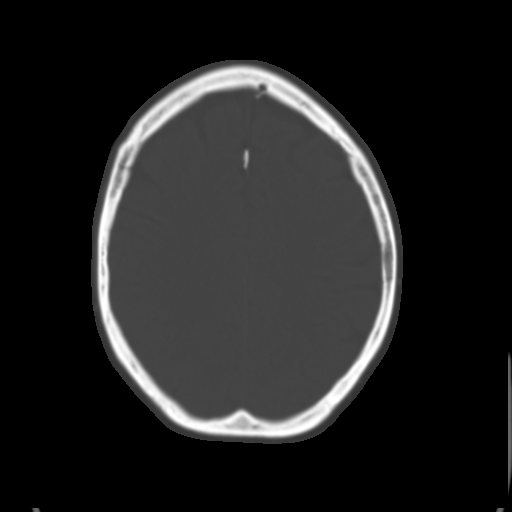
[im 27/36  brain]
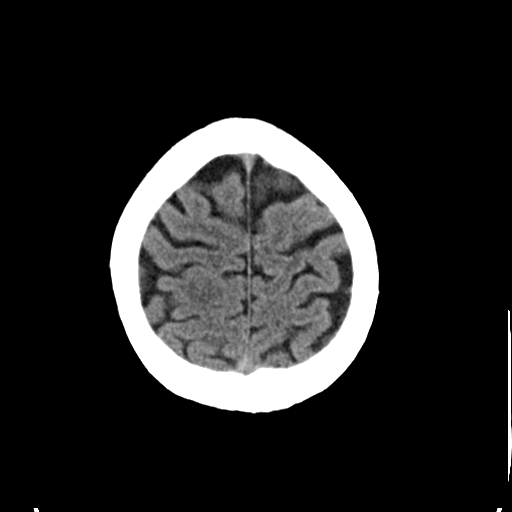
[im 31/36  brain]
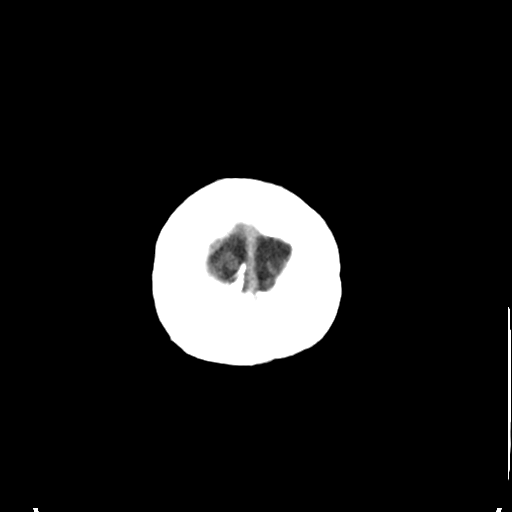

[Series 3: head bone · axial · 0.47mm/px · z∈[+1307,+1371]mm · 4 of 90 slices shown]
[im 9/90  bone]
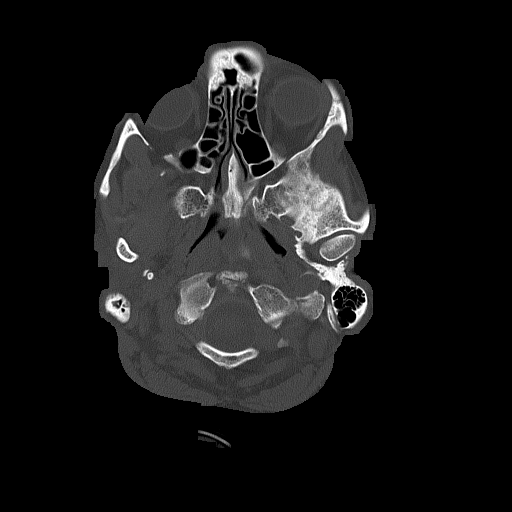
[im 18/90  bone]
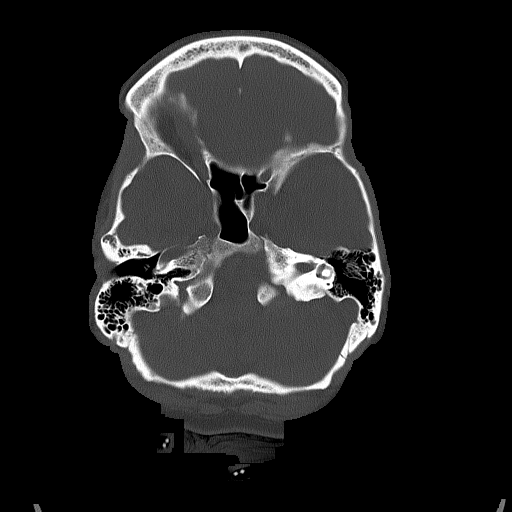
[im 27/90  bone]
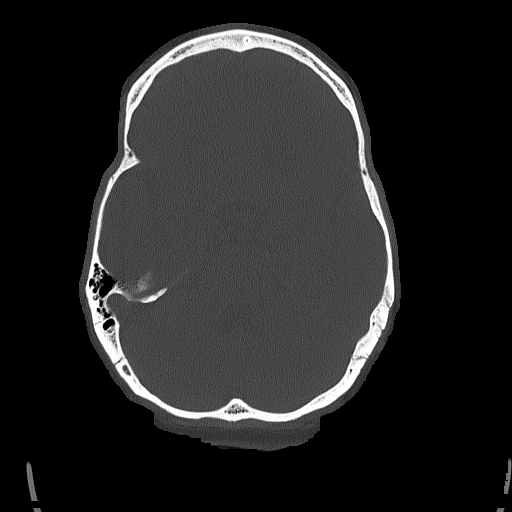
[im 41/90  bone]
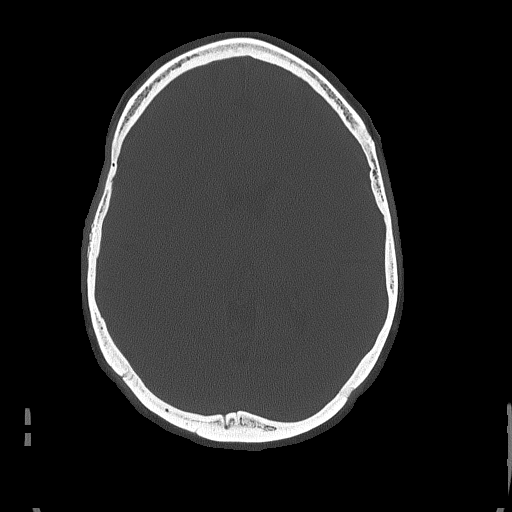

[Series 4: coronal soft tissue · coronal · 0.36mm/px · 3 of 76 slices shown]
[im 26/76  brain]
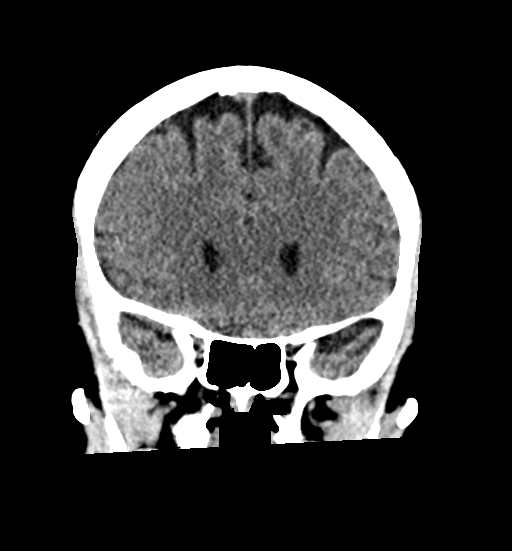
[im 34/76  brain]
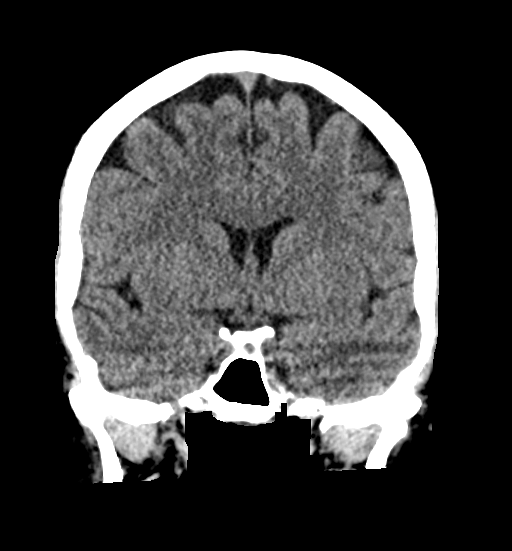
[im 42/76  brain]
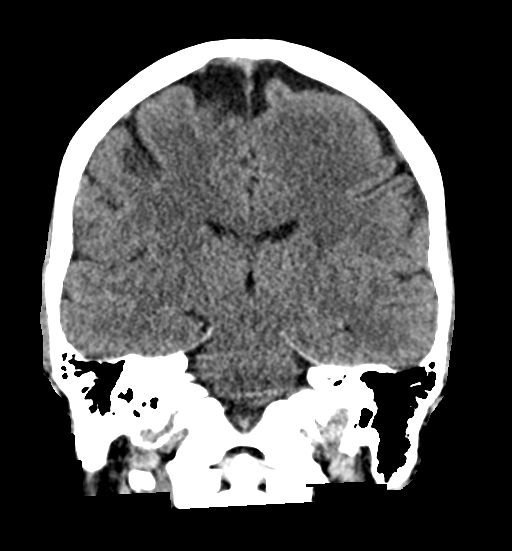

[Series 5: sagittal soft tissue · sagittal · 0.41mm/px · 3 of 62 slices shown]
[im 21/62  brain]
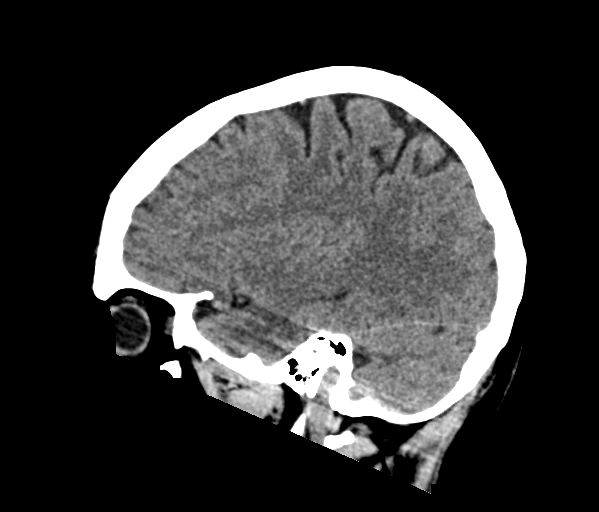
[im 31/62  brain]
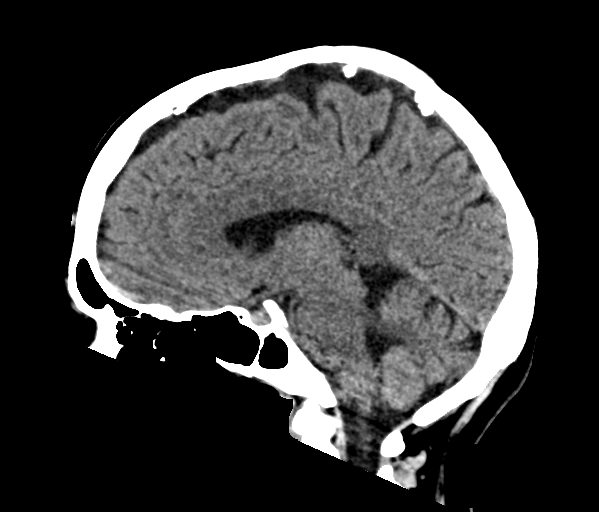
[im 41/62  brain]
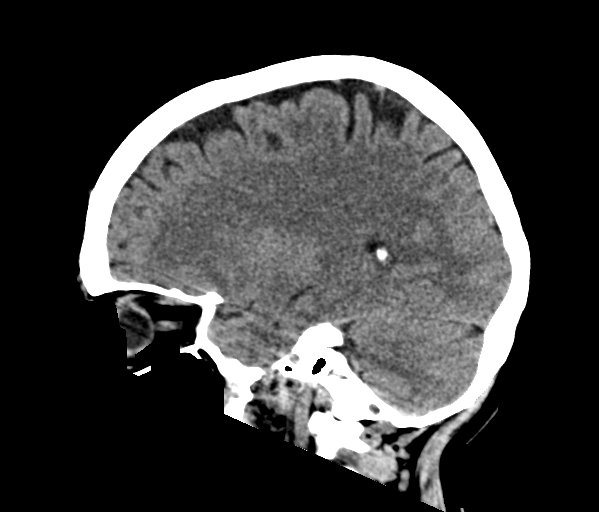

[17 of 47 positions shown; findings below may reference images not displayed]

FINDINGS: Brain: There is no evidence of an acute infarct, intracranial
hemorrhage, mass, midline shift, or extra-axial fluid collection.
The ventricles and sulci are normal.

Vascular: No hyperdense vessel.

Skull: No acute fracture or suspicious osseous lesion.

Sinuses/Orbits: Visualized paranasal sinuses and mastoid air cells
are clear. Unremarkable orbits.

Other: None.
IMPRESSION: Negative head CT.

## 2022-12-25 ENCOUNTER — Other Ambulatory Visit: Payer: Self-pay | Admitting: Nurse Practitioner

## 2022-12-25 DIAGNOSIS — Z1231 Encounter for screening mammogram for malignant neoplasm of breast: Secondary | ICD-10-CM

## 2023-01-22 ENCOUNTER — Ambulatory Visit
Admission: RE | Admit: 2023-01-22 | Discharge: 2023-01-22 | Disposition: A | Discharge status: Home / Self Care | Payer: Medicaid Other | Source: Ambulatory Visit | Attending: Nurse Practitioner | Admitting: Nurse Practitioner

## 2023-01-22 DIAGNOSIS — Z1231 Encounter for screening mammogram for malignant neoplasm of breast: Secondary | ICD-10-CM

## 2023-02-12 ENCOUNTER — Ambulatory Visit (HOSPITAL_BASED_OUTPATIENT_CLINIC_OR_DEPARTMENT_OTHER): Payer: Medicaid Other | Admitting: Certified Nurse Midwife

## 2023-02-12 ENCOUNTER — Encounter (HOSPITAL_BASED_OUTPATIENT_CLINIC_OR_DEPARTMENT_OTHER): Payer: Self-pay | Admitting: Certified Nurse Midwife

## 2023-02-12 ENCOUNTER — Other Ambulatory Visit (HOSPITAL_COMMUNITY)
Admission: RE | Admit: 2023-02-12 | Discharge: 2023-02-12 | Disposition: A | Discharge status: Home / Self Care | Payer: Medicaid Other | Source: Ambulatory Visit | Attending: Certified Nurse Midwife | Admitting: Certified Nurse Midwife

## 2023-02-12 VITALS — BP 106/58 | HR 56 | Ht 67.0 in | Wt 202.6 lb

## 2023-02-12 DIAGNOSIS — Z124 Encounter for screening for malignant neoplasm of cervix: Secondary | ICD-10-CM | POA: Diagnosis present

## 2023-02-12 DIAGNOSIS — Z01419 Encounter for gynecological examination (general) (routine) without abnormal findings: Secondary | ICD-10-CM | POA: Diagnosis not present

## 2023-02-12 NOTE — Progress Notes (Signed)
48 y.o. G69P4004 Married White or Caucasian female here for annual exam. She lives with her spouse and 4 children (son/daughter/son/son). Youngest is 9. She works full time as Dietitian at [employer]. Feels well. Pt states colonoscopy UTD.   Patient's last menstrual period was 02/08/2023.          Sexually active: Yes.    The current method of family planning is tubal ligation and vasectomy.     Upstream - 02/12/23 8657       Pregnancy Intention Screening   Does the patient want to become pregnant in the next year? No    Does the patient's partner want to become pregnant in the next year? No    Would the patient like to discuss contraceptive options today? No            The pregnancy intention screening data noted above was reviewed. Potential methods of contraception were discussed. The patient elected to proceed with No data recorded.  Exercising: Yes.     Smoker:  no  Health Maintenance: Pap:  Collected History of abnormal Pap:  no MMG:  UTD January 2025 Colonoscopy:  UTD BMD:   n/a Screening Labs: PCP follows   reports that she has never smoked. She has never used smokeless tobacco. She reports current alcohol use. She reports that she does not currently use drugs after having used the following drugs: Marijuana.  Past Medical History:  Diagnosis Date   Bipolar 1 disorder ([facility])    No pertinent past medical history     Past Surgical History:  Procedure Laterality Date   CESAREAN SECTION N/A 07/07/2013   Procedure: CESAREAN SECTION;  Surgeon: Mitchel Honour, DO;  Location: [facility];  Service: Obstetrics;  Laterality: N/A;   LEG SKIN LESION  BIOPSY / EXCISION     scar tissue biopsy      Current Outpatient Medications  Medication Sig Dispense Refill   benztropine (COGENTIN) 1 MG tablet Take 1 tablet (1 mg total) by mouth at bedtime. 30 tablet 0   melatonin 3 MG TABS tablet Take 1 tablet (3 mg total) by mouth at bedtime. 30 tablet 0   OLANZapine (ZYPREXA) 15 MG tablet Take 1 tablet  (15 mg total) by mouth at bedtime. 30 tablet 0   polyethylene glycol (MIRALAX / GLYCOLAX) 17 g packet Take 17 g by mouth daily as needed for moderate constipation. 30 each 0   No current facility-administered medications for this visit.    Family History  Problem Relation Age of Onset   Thyroid disease Mother    Mental illness Mother    Parkinson's disease Father    Stroke Maternal Grandmother    Stroke Maternal Grandfather     ROS: Constitutional: negative Genitourinary:negative  Exam:   BP (!) 106/58 (BP Location: Right Arm, Patient Position: Sitting, Cuff Size: Normal)   Pulse (!) 56   Ht 5\' 7"  (1.702 m)   Wt 202 lb 9.6 oz (91.9 kg)   LMP 02/08/2023   BMI 31.73 kg/m   Height: 5\' 7"  (170.2 cm)  General appearance: alert, cooperative and appears stated age Head: Normocephalic, without obvious abnormality, atraumatic Neck: no adenopathy, supple, symmetrical Breasts: Mammogram neg few weeks ago Heart: regular rate  Abdomen: soft, non-tender; bowel sounds normal; no masses,  no organomegaly Extremities: extremities normal, atraumatic, no cyanosis or edema Skin: Skin color, texture, turgor normal. No rashes or lesions Lymph nodes: Cervical, supraclavicular, and axillary nodes normal. No abnormal inguinal nodes palpated Neurologic: Grossly normal  Pelvic: External genitalia:  no lesions              Urethra:  normal appearing urethra with no masses, tenderness or lesions              Bartholins and Skenes: normal                 Vagina: normal appearing vagina with normal color and no discharge, no lesions              Cervix: multiparous appearance, no bleeding following Pap, no cervical motion tenderness, and no lesions              Pap taken: Yes.   Bimanual Exam:  Uterus:  normal size, contour, position, consistency, mobility, non-tender              Adnexa: no mass, fullness, tenderness             Anus:  normal sphincter tone, no lesions  Tyrone Schimke,  CMA, was present for exam.  Assessment/Plan:  1. Gynecologic exam normal - Continue annual screening mammogram and breast self awareness - routine pap smear discussed and collected  2. Cervical cancer screening (Primary) - Cytology - PAP( [place])   RTO 1 year for annual gyn exam and prn if issues arise. Letta Kocher

## 2023-02-17 LAB — CYTOLOGY - PAP
Comment: NEGATIVE
Diagnosis: NEGATIVE
High risk HPV: NEGATIVE

## 2023-02-18 ENCOUNTER — Encounter (HOSPITAL_BASED_OUTPATIENT_CLINIC_OR_DEPARTMENT_OTHER): Payer: Self-pay | Admitting: Certified Nurse Midwife

## 2023-07-10 ENCOUNTER — Ambulatory Visit: Payer: Self-pay
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
## 2023-07-11 DIAGNOSIS — F410 Panic disorder [episodic paroxysmal anxiety] without agoraphobia: Secondary | ICD-10-CM | POA: Insufficient documentation

## 2023-07-11 DIAGNOSIS — F411 Generalized anxiety disorder: Secondary | ICD-10-CM | POA: Insufficient documentation

## 2023-07-11 NOTE — Progress Notes (Signed)
   07/11/23 2311  [facility] Triage Screening (Walk-ins at [facility] only)  How Did You Hear About Us ? Family/Friend  What Is the Reason for Your Visit/Call Today? Pt arrived with her brother to the [facility] after concerns of her well-being, her husband called and said that she was having suicidal ideation and that she was recently off her meds. She has a history of Bipolar I Disorder and manic episodes. She is currently not able to stay on track with conversation and sporadically switches topics. She was not able to finish the rest of the quick screening.  How Long Has This Been Causing You Problems? <Week  Have You Recently Had Any Thoughts About Hurting Yourself? No  Are You Planning to Commit Suicide/Harm Yourself At This time? No  Have you Recently Had Thoughts About Hurting Someone [place]? No  Are You Planning To Harm Someone At This Time? No  Physical Abuse Denies  Verbal Abuse Denies  Sexual Abuse Denies  Exploitation of patient/patient's resources Denies  Self-Neglect Denies  Are you currently experiencing any auditory, visual or other hallucinations? No  Have You Used Any Alcohol or Drugs in the Past 24 Hours? No  Do you have any current medical co-morbidities that require immediate attention? Yes  Please describe current medical co-morbidities that require immediate attention: Traumatic Brain Injury  Clinician description of patient physical appearance/behavior: Manic, Hyper, and very talkative  What Do You Feel Would Help You the Most Today? Medication(s);Stress Management;Treatment for Depression or other mood problem  If access to [facility] was not available, would you have sought care in the Emergency Department? No  Determination of Need Urgent (48 hours)  Options For Referral [facility];Medication Management;Other: Comment;Therapeutic Triage Services  Determination of Need filed? Yes

## 2023-07-12 ENCOUNTER — Ambulatory Visit (HOSPITAL_COMMUNITY)
Admission: EM | Admit: 2023-07-12 | Discharge: 2023-07-12 | Disposition: A | Discharge status: Home / Self Care | Attending: Nurse Practitioner | Admitting: Nurse Practitioner

## 2023-07-12 DIAGNOSIS — F411 Generalized anxiety disorder: Secondary | ICD-10-CM

## 2023-07-12 NOTE — ED Provider Notes (Signed)
 Behavioral Health Urgent Care Medical Screening Exam  Patient Name: Janet Welch MRN: [id number] Date of Evaluation: 07/12/23 Chief Complaint:  manic behavior Diagnosis:  Final diagnoses:  Generalized anxiety disorder    History of Present illness: Janet Welch is a 48 y.o. female with a psychiatric history significant for bipolar disorder and depression patient presented to [facility] as a walk in accompanied by her brother Zell Cordia with complaints of having a panic attack and needing hydroxyzine .   Adrien JINNY Abu, 48 y.o., female patient seen face to face by this provider and chart reviewed on 07/12/23.  On evaluation Janet Welch reports that she is not currently taking any medications and was last on medications about 1 year ago. Patient reports that she was previously being followed by Dr. Swaziland Kohler at [facility]. Patient reports that she drove to [place]  with her daughter and mother-in-law to see some friends and she felt that her mother-in-law was being negative. Patient reports that her brother drove her home from [place]  on Saturday and told her she needed to come to the hospital. When asked why her brother brought her to the hospital patient states she does not know and that her brother always brings her here. Patient reported she had a panic attack today and that hydroxyzine  has helped in the past with her panic attacks. Patient reports that she has been having a  lot of stress in her relationship with her husband.  Patient denies any SI/HI or AVH.  Per chart review patient was hospitalized at [facility] in 2023 for 15 days.   During evaluation Janet Welch is lying down on the bench in the assessment room in no acute distress. She is alert, oriented x 4, calm, cooperative.  Her mood is anxious with congruent affect.  She has pressured speech and appears drowsy. Patient's thought process is somewhat scattered with tangential ramblings. Patient is able to converse coherently but  is easily distracted during the conversation and requires frequent redirection to stay on topic. She also denies suicidal/self-harm/homicidal ideation, psychosis, and paranoia.    Patient appears to be experiencing some manic like behaviors with pressured speech, easily distracted, and was offered inpatient treatment for mood regulation. Patient stated that she does not want inpatient treatment and the she will follow up with her psychiatrist at [facility]. Patient is not an imminent danger to herself or others at this time and will be discharged to follow up with outpatient provider.  Flowsheet Row Admission (Discharged) [facility]2021 [facility] Admission (Discharged) [facility]  C-SSRS RISK CATEGORY No Risk No Risk No Risk    Psychiatric Specialty Exam  Presentation  General Appearance:Casual  Eye Contact:Good  Speech:Clear and Coherent  Speech Volume:Normal  Handedness:Right   Mood and Affect  Mood: Anxious  Affect: Appropriate   Thought Process  Thought Processes: Coherent  Descriptions of Associations:Intact  Orientation:Full (Time, Place and Person)  Thought Content:Logical  Diagnosis of Schizophrenia or Schizoaffective disorder in past: No data recorded Duration of Psychotic Symptoms: No data recorded Hallucinations:None  Ideas of Reference:None  Suicidal Thoughts:No  Homicidal Thoughts:No   Sensorium  Memory: Immediate Good; Recent Good; Remote Good  Judgment: Fair  Insight: Fair   Chartered certified accountant: Fair  Attention Span: Fair  Recall: [employer] of Knowledge: Fair  Language: Fair  Psychomotor Activity  Psychomotor Activity: Normal   Assets  Assets: Manufacturing systems engineer; Housing; Social Support   Sleep  Sleep: Poor  Number of  hours:  5   Physical Exam: Physical Exam HENT:     Head: Normocephalic.     Nose: Nose normal.   Eyes:     Pupils: Pupils are equal, round, and reactive to light.    Cardiovascular:     Rate and Rhythm: Normal rate.  Pulmonary:     Effort: Pulmonary effort is normal.  Abdominal:     General: Abdomen is flat.   Musculoskeletal:        General: Normal range of motion.   Skin:    General: Skin is warm.   Neurological:     Mental Status: She is alert and oriented to person, place, and time.   Psychiatric:        Attention and Perception: She is inattentive.        Mood and Affect: Mood is anxious.        Speech: Speech is rapid and pressured.        Behavior: Behavior is hyperactive.        Thought Content: Thought content is not paranoid or delusional. Thought content does not include homicidal or suicidal ideation. Thought content does not include homicidal or suicidal plan.        Cognition and Memory: Cognition normal.        Judgment: Judgment is impulsive.    Review of Systems  Constitutional: Negative.   HENT: Negative.    Eyes: Negative.   Respiratory: Negative.    Cardiovascular: Negative.   Gastrointestinal: Negative.   Genitourinary: Negative.   Musculoskeletal: Negative.   Skin: Negative.   Neurological: Negative.   Endo/Heme/Allergies: Negative.   Psychiatric/Behavioral:  The patient is nervous/anxious.    Blood pressure (!) 156/95, pulse 92, temperature 98.3 F (36.8 C), resp. rate 18, SpO2 99%. There is no height or weight on file to calculate BMI.  Musculoskeletal: Strength & Muscle Tone: within normal limits Gait & Station: normal Patient leans: N/A   [facility] MSE Discharge Disposition for Follow up and Recommendations: Based on my evaluation the patient does not appear to have an emergency medical condition and can be discharged with resources and follow up care in outpatient services for Medication Management and Individual  Therapy   Akeira Lahm E Jerimiah Wolman, NP 07/12/2023, 1:56 AM

## 2023-07-12 NOTE — Discharge Instructions (Addendum)
  Discharge recommendations:  Patient is to take medications as prescribed. Please see information for follow-up appointment with psychiatry and therapy. Please follow up with your primary care provider for all medical related needs.  Please follow up with outpatient provider Dr. Swaziland Kohler at [facility].   Therapy: We recommend that patient participate in individual therapy to address mental health concerns.  Medications: The patient or guardian is to contact a medical professional and/or outpatient provider to address any new side effects that develop. The patient or guardian should update outpatient providers of any new medications and/or medication changes.   Atypical antipsychotics: If you are prescribed an atypical antipsychotic, it is recommended that your height, weight, BMI, blood pressure, fasting lipid panel, and fasting blood sugar be monitored by your outpatient providers.  Safety:  The patient should abstain from use of illicit substances/drugs and abuse of any medications. If symptoms worsen or do not continue to improve or if the patient becomes actively suicidal or homicidal then it is recommended that the patient return to the closest hospital emergency department, the [facility], or call 911 for further evaluation and treatment. National Suicide Prevention Lifeline 1-800-SUICIDE or (857) 424-8966.  About 988 988 offers 24/7 access to trained crisis counselors who can help people experiencing mental health-related distress. People can call or text 988 or chat 988lifeline.org for themselves or if they are worried about a loved one who may need crisis support.  Crisis Mobile: [facility]:                     (512)857-5515 (for crisis response 24 hours a day) [facility] Hotline:                                            737 307 5100

## 2023-07-13 ENCOUNTER — Telehealth: Payer: Self-pay

## 2023-07-13 NOTE — Telephone Encounter (Signed)
 Called patient to schedule appt. Patient hasn't been seen since 2023.

## 2023-07-14 ENCOUNTER — Telehealth: Payer: Self-pay

## 2023-07-14 NOTE — Telephone Encounter (Signed)
 Copied from CRM [id number]. Topic: Appointments - Scheduling Inquiry for Clinic >> Jul 14, 2023  1:29 PM Charlet HERO wrote: Reason for CRM: Patient calling to make appt got call and it was to make appt, she is currently in the hospital will be discharged by the end of the week could not get a date earlier than Sept will need to have one scheduled for 14 days, please schedule and contact patient [phone]  LVM FOR PT AND SENT MYCHART MESSAGE.

## 2023-07-19 ENCOUNTER — Ambulatory Visit (HOSPITAL_COMMUNITY)
Admission: EM | Admit: 2023-07-19 | Discharge: 2023-07-19 | Disposition: A | Discharge status: Home / Self Care | Attending: Behavioral Health | Admitting: Behavioral Health

## 2023-07-19 DIAGNOSIS — Z79899 Other long term (current) drug therapy: Secondary | ICD-10-CM | POA: Insufficient documentation

## 2023-07-19 DIAGNOSIS — Z008 Encounter for other general examination: Secondary | ICD-10-CM | POA: Insufficient documentation

## 2023-07-19 DIAGNOSIS — F319 Bipolar disorder, unspecified: Secondary | ICD-10-CM | POA: Insufficient documentation

## 2023-07-19 DIAGNOSIS — Z7689 Persons encountering health services in other specified circumstances: Secondary | ICD-10-CM

## 2023-07-19 NOTE — Care Plan (Signed)
 Patient's husband, Simone Rodenbeck, contacted CM post-discharge.  He believes that his wife was released too early and that we sent her to the church and the church people don't think she's acting right.  He said in situations like this she is usually in for a minimum of two weeks.  Mr. Abruzzo asked for the doctor to contact him directly.  Gave message to Dr. Vicenta with phone number [phone].

## 2023-07-19 NOTE — Discharge Instructions (Addendum)
 Discharge recommendations:   Medications: Patient is to take medications as prescribed. The patient or patient's guardian is to contact a medical professional and/or outpatient provider to address any new side effects that develop. The patient or the patient's guardian should update outpatient providers of any new medications and/or medication changes.   Outpatient Follow up:  Name of Provider Agency Referred: [facility] Date/Time of Appointment: Wednesday, July 22, 2023 at 2:00 pm Address: [address] [place] [address] [place], [place] [address] Tel:[phone]   Therapy: We recommend that patient participate in individual therapy to address mental health concerns.  Atypical antipsychotics: If you are prescribed an atypical antipsychotic, it is recommended that your height, weight, BMI, blood pressure, fasting lipid panel, and fasting blood sugar be monitored by your outpatient providers.  Safety:   The following safety precautions should be taken:   No sharp objects. This includes scissors, razors, scrapers, and putty knives.   Chemicals should be removed and locked up.   Medications should be removed and locked up.   Weapons should be removed and locked up. This includes firearms, knives and instruments that can be used to cause injury.   The patient should abstain from use of illicit substances/drugs and abuse of any medications.  If symptoms worsen or do not continue to improve or if the patient becomes actively suicidal or homicidal then it is recommended that the patient return to the closest hospital emergency department, the [facility], or call 911 for further evaluation and treatment. National Suicide Prevention Lifeline 1-800-SUICIDE or 469 257 0598.  About 988 988 offers 24/7 access to trained crisis counselors who can help people experiencing mental health-related distress. People can call or text 988 or chat  988lifeline.org for themselves or if they are worried about a loved one who may need crisis support.
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
## 2023-08-18 ENCOUNTER — Ambulatory Visit (HOSPITAL_COMMUNITY)
Admission: EM | Admit: 2023-08-18 | Discharge: 2023-08-18 | Disposition: A | Discharge status: Home / Self Care | Attending: Psychiatry | Admitting: Psychiatry

## 2023-08-18 DIAGNOSIS — F3160 Bipolar disorder, current episode mixed, unspecified: Secondary | ICD-10-CM

## 2023-08-18 DIAGNOSIS — F411 Generalized anxiety disorder: Secondary | ICD-10-CM

## 2023-08-18 MED ORDER — DIVALPROEX SODIUM ER 250 MG PO TB24: 500.0000 mg | ORAL_TABLET | Freq: Every day | ORAL | 0 refills | Status: DC

## 2023-08-18 MED ORDER — OLANZAPINE 20 MG PO TBDP: 20.0000 mg | ORAL_TABLET | Freq: Every day | ORAL | 0 refills | Status: DC

## 2023-08-18 NOTE — Progress Notes (Signed)
   08/18/23 1604  [place] Suicide Severity Rating Scale  1. In the past month -  Have you wished you were dead or wished you could go to sleep and not wake up? No  2. In the past month - Have you actually had any thoughts of killing yourself? No  6. Have you ever done anything, started to do anything, or prepared to do anything to end your life? No  C-SSRS RISK CATEGORY No Risk

## 2023-08-18 NOTE — Discharge Instructions (Addendum)

## 2023-08-18 NOTE — ED Provider Notes (Signed)
 Behavioral Health Urgent Care Medical Screening Exam  Patient Name: Janet Welch MRN: [id number] Date of Evaluation: 08/18/23 Chief Complaint:   Diagnosis:  Final diagnoses:  Bipolar affective disorder, current episode mixed, current episode severity unspecified (HCC)  GAD (generalized anxiety disorder)    History of Present illness: Janet Welch is a 48 y.o. female. who presented voluntarily and accompanied by her son requesting a refill (a few days supply) of sleep medication. Pt stated that her prescription ran out and she needs medication for tonight and tomorrow until she can have her medication management appointment with her regular NP. Pt denied SI, HI, NSSH, AVH, paranoia and substance use. Pt stated that she was admitted inpatient at [facility]22-29/20[facility] after falling asleep in a room at her church. Pt stated that her NP for medication management is [place] Codhler of [facility] and she sees an OP therapist, Allean Jenny. Spoke with patient husband he stated He is not concerned of patient safety. Janet Welch confirmed that he and patient attempted to call patient's psychiatric provider, but they kept them on hold numerous times today and patient did not call back. Patient is asking for two nights worth of Depakote  500 milligrams BID and Zyprexa  20 milligrams at bedtime, states that she will be seeing her psychiatric provider on July 31st and has a 1030 a.m. appointment.  Patient presents with clear insight into her condition and demonstrates appropriate judgment and motivation for continuity of care. No acute psychiatric safety concerns identified during evaluation. She is requesting a short-term refill of her prescribed medications to prevent interruption in treatment until her confirmed appointment on 08/20/23.  Patient is psychiatrically stable and does not meet criteria for inpatient admission  Recommend short-term supply (2 days) of Depakote  500 mg BID and Zyprexa  20 mg QHS  to cover until outpatient appointment  Confirmed outpatient psychiatric follow-up scheduled for 08/20/23 at 10:30 AM  Encouraged patient and family to reach out to outpatient provider if appointment needs to be rescheduled or for further medication assistance  Provided education on monitoring for worsening symptoms and when to return to ED  Flowsheet Row ED from 08/18/2023 in [facility] ED from 07/19/2023 in [facility] Admission (Discharged) [facility]  C-SSRS RISK CATEGORY No Risk No Risk No Risk    Psychiatric Specialty Exam  Presentation  General Appearance:Appropriate for Environment  Eye Contact:Fair  Speech:Clear and Coherent  Speech Volume:Normal  Handedness:Right   Mood and Affect  Mood: Euthymic  Affect: Congruent   Thought Process  Thought Processes: Coherent  Descriptions of Associations:Intact  Orientation:Full (Time, Place and Person)  Thought Content:Logical  Diagnosis of Schizophrenia or Schizoaffective disorder in past: No data recorded Duration of Psychotic Symptoms: No data recorded Hallucinations:None  Ideas of Reference:None  Suicidal Thoughts:No  Homicidal Thoughts:No   Sensorium  Memory: Immediate Fair; Recent Fair; Remote Fair  Judgment: Fair  Insight: Fair   Art therapist  Concentration: Fair  Attention Span: Fair  Recall: [employer] of Knowledge: Fair  Language: Fair   Psychomotor Activity  Psychomotor Activity: Normal   Assets  Assets: Manufacturing systems engineer; Desire for Improvement; Financial Resources/Insurance; Leisure Time; Physical Health; Social Support; Transportation   Sleep  Sleep: Fair  Number of hours:  5   Physical Exam: Physical Exam ROS There were no vitals taken for this visit. There is no height or weight on file to calculate  BMI.  Musculoskeletal: Strength & Muscle Tone: within normal limits Gait & Station: normal Patient leans: N/A   [facility] MSE Discharge Disposition for Follow up and Recommendations: Based on my evaluation the patient does not appear to have an emergency medical condition and can be discharged with resources and follow up care in outpatient services for Medication Management Prescribed 2 days of Depakote  500 mg PO BID, and Zyprexa  20 mg PO @ HS for mood, patient to go to follow up appointment with [place] Koehler @ [facility] on 08/20/2023.   Hanya Guerin MOTLEY-MANGRUM, PMHNP 08/18/2023, 7:11 PM

## 2023-08-18 NOTE — Progress Notes (Signed)
   08/18/23 1557  [facility] Triage Screening (Walk-ins at [facility] only)  How Did You Hear About Us ? Self  What Is the Reason for Your Visit/Call Today? Pt is a 48 yo female who presented voluntarily and accomapnied by her son requesting a refill (a few days supply) of sleep medication. Pt stated that her prescription ran out and she needs medication for tonight until she can have her medication management appointment tomorrow with her regular NP. Pt denied SI, HI, NSSH, AVH, paranoia and substance use. Pt stated that she was admitted inpatient at [facility] 6/22-29/2025 after [place]. Pt stated that her NP for medication management is [place] [facility] and she sees an OP therapist, Allean Jenny.  How Long Has This Been Causing You Problems? 1 wk - 1 month  Have You Recently Had Any Thoughts About Hurting Yourself? No  Are You Planning to Commit Suicide/Harm Yourself At This time? No  Have you Recently Had Thoughts About Hurting Someone Sherral? No  Are You Planning To Harm Someone At This Time? No  Physical Abuse Denies  Verbal Abuse Denies  Sexual Abuse Denies  Exploitation of patient/patient's resources Denies  Self-Neglect Denies  Possible abuse reported to:  (na)  Are you currently experiencing any auditory, visual or other hallucinations? No  Have You Used Any Alcohol or Drugs in the Past 24 Hours? No  Do you have any current medical co-morbidities that require immediate attention? No  Please describe current medical co-morbidities that require immediate attention: none reported  Clinician description of patient physical appearance/behavior: calm, cooperative, alert and fully oriented. casually dressed and adequately groomed. flat affect with euthymic mood.  What Do You Feel Would Help You the Most Today? Treatment for Depression or other mood problem  If access to [facility] was not available, would you have sought care in the Emergency  Department? No  Determination of Need Routine (7 days)  Options For Referral Medication Management

## 2023-10-14 ENCOUNTER — Encounter: Payer: Self-pay | Admitting: Nurse Practitioner

## 2023-11-09 ENCOUNTER — Other Ambulatory Visit: Payer: Self-pay

## 2023-11-09 ENCOUNTER — Emergency Department (HOSPITAL_COMMUNITY)
Admission: EM | Admit: 2023-11-09 | Discharge: 2023-11-10 | Disposition: A | Discharge status: Other Acute Inpatient Hospital | Payer: MEDICAID | Attending: Emergency Medicine | Admitting: Emergency Medicine

## 2023-11-09 DIAGNOSIS — F3164 Bipolar disorder, current episode mixed, severe, with psychotic features: Secondary | ICD-10-CM

## 2023-11-09 DIAGNOSIS — F29 Unspecified psychosis not due to a substance or known physiological condition: Secondary | ICD-10-CM | POA: Insufficient documentation

## 2023-11-09 DIAGNOSIS — F23 Brief psychotic disorder: Secondary | ICD-10-CM

## 2023-11-09 LAB — CBC WITH DIFFERENTIAL/PLATELET
Abs Immature Granulocytes: 0.02 K/uL (ref 0.00–0.07)
Basophils Absolute: 0 K/uL (ref 0.0–0.1)
Basophils Relative: 0 %
Eosinophils Absolute: 0 K/uL (ref 0.0–0.5)
Eosinophils Relative: 0 %
HCT: 39.6 % (ref 36.0–46.0)
Hemoglobin: 13.3 g/dL (ref 12.0–15.0)
Immature Granulocytes: 0 %
Lymphocytes Relative: 14 %
Lymphs Abs: 1.5 K/uL (ref 0.7–4.0)
MCH: 29.7 pg (ref 26.0–34.0)
MCHC: 33.6 g/dL (ref 30.0–36.0)
MCV: 88.4 fL (ref 80.0–100.0)
Monocytes Absolute: 1 K/uL (ref 0.1–1.0)
Monocytes Relative: 10 %
Neutro Abs: 7.7 K/uL (ref 1.7–7.7)
Neutrophils Relative %: 76 %
Platelets: 273 K/uL (ref 150–400)
RBC: 4.48 MIL/uL (ref 3.87–5.11)
RDW: 13.4 % (ref 11.5–15.5)
WBC: 10.3 K/uL (ref 4.0–10.5)
nRBC: 0 % (ref 0.0–0.2)

## 2023-11-09 LAB — HCG, QUANTITATIVE, PREGNANCY: hCG, Beta Chain, Quant, S: <1 m[IU]/mL (ref <5)

## 2023-11-09 LAB — COMPREHENSIVE METABOLIC PANEL WITH GFR
ALT: 19 U/L (ref 0–44)
AST: 36 U/L (ref 15–41)
Albumin: 4.5 g/dL (ref 3.5–5.0)
Alkaline Phosphatase: 63 U/L (ref 38–126)
Anion gap: 18 — ABNORMAL HIGH (ref 5–15)
BUN: 15 mg/dL (ref 6–20)
CO2: 21 mmol/L — ABNORMAL LOW (ref 22–32)
Calcium: 9.5 mg/dL (ref 8.9–10.3)
Chloride: 99 mmol/L (ref 98–111)
Creatinine, Ser: 0.97 mg/dL (ref 0.44–1.00)
GFR, Estimated: >60 mL/min (ref >60)
Glucose, Bld: 92 mg/dL (ref 70–99)
Potassium: 3.2 mmol/L — ABNORMAL LOW (ref 3.5–5.1)
Sodium: 138 mmol/L (ref 135–145)
Total Bilirubin: 1 mg/dL (ref 0.0–1.2)
Total Protein: 7.1 g/dL (ref 6.5–8.1)

## 2023-11-09 LAB — URINE DRUG SCREEN
Amphetamines: NEGATIVE
Barbiturates: NEGATIVE
Benzodiazepines: NEGATIVE
Cocaine: NEGATIVE
Fentanyl: NEGATIVE
Methadone Scn, Ur: NEGATIVE
Opiates: NEGATIVE
Tetrahydrocannabinol: POSITIVE — AB

## 2023-11-09 LAB — ETHANOL: Alcohol, Ethyl (B): <15 mg/dL (ref <15)

## 2023-11-09 LAB — SALICYLATE LEVEL: Salicylate Lvl: <7 mg/dL — ABNORMAL LOW (ref 7.0–30.0)

## 2023-11-09 LAB — ACETAMINOPHEN LEVEL: Acetaminophen (Tylenol), Serum: <10 ug/mL — ABNORMAL LOW (ref 10–30)

## 2023-11-09 LAB — VALPROIC ACID LEVEL: Valproic Acid Lvl: <10 ug/mL — ABNORMAL LOW (ref 50–100)

## 2023-11-09 MED ORDER — POTASSIUM CHLORIDE CRYS ER 20 MEQ PO TBCR
40.0000 meq | EXTENDED_RELEASE_TABLET | Freq: Once | ORAL | Status: AC
  Administered 2023-11-09: 40 meq via ORAL
  Filled 2023-11-09: qty 4

## 2023-11-09 MED ORDER — DIVALPROEX SODIUM ER 500 MG PO TB24
500.0000 mg | ORAL_TABLET | Freq: Every day | ORAL | Status: DC
  Administered 2023-11-09: 500 mg via ORAL
  Filled 2023-11-09: qty 1

## 2023-11-09 MED ORDER — MELATONIN 3 MG PO TABS
3.0000 mg | ORAL_TABLET | Freq: Every day | ORAL | Status: DC
  Administered 2023-11-09: 3 mg via ORAL
  Filled 2023-11-09: qty 1

## 2023-11-09 MED ORDER — POLYETHYLENE GLYCOL 3350 17 G PO PACK: 17.0000 g | PACK | Freq: Every day | ORAL | Status: DC | PRN

## 2023-11-09 MED ORDER — OLANZAPINE 5 MG PO TBDP
20.0000 mg | ORAL_TABLET | Freq: Every day | ORAL | Status: DC
  Administered 2023-11-09: 20 mg via ORAL
  Filled 2023-11-09: qty 4

## 2023-11-09 NOTE — ED Provider Notes (Signed)
 [facility] EMERGENCY DEPARTMENT AT [facility] Provider Note  CSN: [id number] Arrival date & time: 11/09/23 2026  Chief Complaint(s) Psychiatric Evaluation  HPI Janet Welch is a 48 y.o. female with bipolar disorder, presenting to the emergency department with altered mental status.  Patient has apparently been reacting to internal stimuli, hearing voices, stating she wants the harm herself, running around in the street and try to take off all her clothes, not eating or sleeping.  Patient reports nothing is hurting.  When asked if she is taking her medications she says I do not have them with me.  I asked the patient if anything is bothering her and she says I like to be raped.  When asked if she is trying to hurt herself she reports not right now.  History is limited due to psychiatric disorder   Past Medical History Past Medical History:  Diagnosis Date   Bipolar 1 disorder (HCC)    No pertinent past medical history    Patient Active Problem List   Diagnosis Date Noted   Bipolar I disorder, most recent episode mixed (HCC) 02/25/2021   Bipolar I disorder, current or most recent episode manic, severe with mood-congruent psychotic features (HCC) 04/01/2017   MDD (major depressive disorder), severe (HCC) 03/30/2017   MDD (major depressive disorder), recurrent severe, without psychosis (HCC) 03/30/2017   Home Medication(s) Prior to Admission medications   Medication Sig Start Date End Date Taking? Authorizing Provider  divalproex  (DEPAKOTE  ER) 250 MG 24 hr tablet Take 2 tablets (500 mg total) by mouth daily. 08/18/23 08/17/24  Motley-Mangrum, Jadeka A, PMHNP  melatonin 3 MG TABS tablet Take 1 tablet (3 mg total) by mouth at bedtime. 03/19/21   Tex Drilling, NP  OLANZapine  zydis (ZYPREXA  ZYDIS) 20 MG disintegrating tablet Take 1 tablet (20 mg total) by mouth at bedtime. 08/18/23   Motley-Mangrum, Jadeka A, PMHNP  polyethylene glycol (MIRALAX  / GLYCOLAX ) 17 g packet Take 17 g  by mouth daily as needed for moderate constipation. 03/19/21   Tex Drilling, NP                                                                                                                                    Past Surgical History Past Surgical History:  Procedure Laterality Date   CESAREAN SECTION N/A 07/07/2013   Procedure: CESAREAN SECTION;  Surgeon: Duwaine Blumenthal, DO;  Location: [facility];  Service: Obstetrics;  Laterality: N/A;   LEG SKIN LESION  BIOPSY / EXCISION     scar tissue biopsy     Family History Family History  Problem Relation Age of Onset   Thyroid  disease Mother    Mental illness Mother    Parkinson's disease Father    Stroke Maternal Grandmother    Stroke Maternal Grandfather     Social History Social History   Tobacco Use   Smoking status: Never   Smokeless tobacco: Never  Vaping Use   Vaping status: Never Used  Substance Use Topics   Alcohol use: Yes    Alcohol/week: 0.0 - 1.0 standard drinks of alcohol    Comment: socially   Drug use: Not Currently    Types: Marijuana   Allergies Bee venom  Review of Systems Review of Systems  Unable to perform ROS: Psychiatric disorder    Physical Exam Vital Signs  I have reviewed the triage vital signs BP (!) 110/52   Pulse 89   Temp 98.8 F (37.1 C)   Resp 18   Ht 5' 7 (1.702 m)   Wt 74.6 kg   SpO2 100%   BMI 25.76 kg/m  Physical Exam Vitals and nursing note reviewed.  Constitutional:      General: She is not in acute distress.    Appearance: She is well-developed.  HENT:     Head: Normocephalic and atraumatic.     Mouth/Throat:     Mouth: Mucous membranes are moist.  Eyes:     Pupils: Pupils are equal, round, and reactive to light.  Cardiovascular:     Rate and Rhythm: Normal rate and regular rhythm.     Heart sounds: No murmur heard. Pulmonary:     Effort: Pulmonary effort is normal. No respiratory distress.     Breath sounds: Normal breath sounds.  Abdominal:     General: Abdomen is  flat.     Palpations: Abdomen is soft.     Tenderness: There is no abdominal tenderness.  Musculoskeletal:        General: No tenderness.     Right lower leg: No edema.     Left lower leg: No edema.  Skin:    General: Skin is warm and dry.  Neurological:     General: No focal deficit present.     Mental Status: She is alert.     Cranial Nerves: No cranial nerve deficit.  Psychiatric:        Attention and Perception: She is inattentive. She perceives auditory hallucinations.        Mood and Affect: Affect is blunt and flat.        Speech: Speech is delayed.        Behavior: Behavior is slowed, withdrawn and actively hallucinating.        Thought Content: Thought content is delusional. Thought content does not include homicidal or suicidal ideation.     ED Results and Treatments Labs (all labs ordered are listed, but only abnormal results are displayed) Labs Reviewed  COMPREHENSIVE METABOLIC PANEL WITH GFR - Abnormal; Notable for the following components:      Result Value   Potassium 3.2 (*)    CO2 21 (*)    Anion gap 18 (*)    All other components within normal limits  URINE DRUG SCREEN - Abnormal; Notable for the following components:   Tetrahydrocannabinol POSITIVE (*)    All other components within normal limits  ACETAMINOPHEN  LEVEL - Abnormal; Notable for the following components:   Acetaminophen  (Tylenol ), Serum <10 (*)    All other components within normal limits  SALICYLATE LEVEL - Abnormal; Notable for the following components:   Salicylate Lvl <7.0 (*)    All other components within normal limits  VALPROIC ACID LEVEL - Abnormal; Notable for the following components:   Valproic Acid Lvl <10 (*)    All other components within normal limits  ETHANOL  CBC WITH DIFFERENTIAL/PLATELET  POC URINE PREG, ED  Radiology No results found.  Pertinent  labs & imaging results that were available during my care of the patient were reviewed by me and considered in my medical decision making (see MDM for details).  Medications Ordered in ED Medications  divalproex  (DEPAKOTE  ER) 24 hr tablet 500 mg (has no administration in time range)  melatonin tablet 3 mg (has no administration in time range)  OLANZapine  zydis (ZYPREXA ) disintegrating tablet 20 mg (has no administration in time range)  polyethylene glycol (MIRALAX  / GLYCOLAX ) packet 17 g (has no administration in time range)  potassium chloride  SA (KLOR-CON  M) CR tablet 40 mEq (40 mEq Oral Given 11/09/23 2252)                                                                                                                                     Procedures Procedures  (including critical care time)  Medical Decision Making / ED Course   MDM:  48 year old presenting to the emergency department with altered mental status.  Patient is overall well-appearing with reassuring vital signs, physical examination unremarkable other than psychiatric exam.  She denies complaints   Patient seems to be floridly psychotic.  She does have a history of underlying psychiatric illness.  Low concern for underlying medical cause of her symptoms.  She was placed on IVC.  I have filled out first exam and uphold IVC.  Unclear if patient is compliant with her home medications.  Will check Depakote  level.  Patient clearly will need psychiatric evaluation and likely psychiatric admission.  Clinical Course as of 11/09/23 2258  Mon Nov 09, 2023  2257 Patient is medically cleared for psychiatric evaluation. Will re-order home meds. [WS]    Clinical Course User Index [WS] Francesca Elsie CROME, MD     Additional history obtained: -Additional history obtained from ems -External records from outside source obtained and reviewed including: Chart review including previous notes, labs, imaging, consultation notes  including prior notes   Lab Tests: -I ordered, reviewed, and interpreted labs.   The pertinent results include:   Labs Reviewed  COMPREHENSIVE METABOLIC PANEL WITH GFR - Abnormal; Notable for the following components:      Result Value   Potassium 3.2 (*)    CO2 21 (*)    Anion gap 18 (*)    All other components within normal limits  URINE DRUG SCREEN - Abnormal; Notable for the following components:   Tetrahydrocannabinol POSITIVE (*)    All other components within normal limits  ACETAMINOPHEN  LEVEL - Abnormal; Notable for the following components:   Acetaminophen  (Tylenol ), Serum <10 (*)    All other components within normal limits  SALICYLATE LEVEL - Abnormal; Notable for the following components:   Salicylate Lvl <7.0 (*)    All other components within normal limits  VALPROIC ACID LEVEL - Abnormal; Notable for the following components:   Valproic Acid Lvl <10 (*)    All other  components within normal limits  ETHANOL  CBC WITH DIFFERENTIAL/PLATELET  POC URINE PREG, ED    Notable for + THC  Medicines ordered and prescription drug management: Meds ordered this encounter  Medications   potassium chloride  SA (KLOR-CON  M) CR tablet 40 mEq   divalproex  (DEPAKOTE  ER) 24 hr tablet 500 mg   melatonin tablet 3 mg   OLANZapine  zydis (ZYPREXA ) disintegrating tablet 20 mg   polyethylene glycol (MIRALAX  / GLYCOLAX ) packet 17 g    -I have reviewed the patients home medicines and have made adjustments as needed  Social Determinants of Health:  Diagnosis or treatment significantly limited by social determinants of health: THC use    Reevaluation: After the interventions noted above, I reevaluated the patient and found that their symptoms have stayed the same  Co morbidities that complicate the patient evaluation  Past Medical History:  Diagnosis Date   Bipolar 1 disorder (HCC)    No pertinent past medical history       Dispostion: Disposition decision including need for  hospitalization was considered, and patient boarding for psychiatric evaluation    Final Clinical Impression(s) / ED Diagnoses Final diagnoses:  Acute psychosis (HCC)     This chart was dictated using voice recognition software.  Despite best efforts to proofread,  errors can occur which can change the documentation meaning.    Francesca Elsie CROME, MD 11/09/23 2258
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
## 2023-11-09 NOTE — ED Notes (Signed)
 Lab notified to add on Valproic acid level

## 2023-11-10 ENCOUNTER — Encounter (HOSPITAL_COMMUNITY): Payer: Self-pay | Admitting: Psychiatry

## 2023-11-10 ENCOUNTER — Inpatient Hospital Stay (HOSPITAL_COMMUNITY)
Admission: AD | Admit: 2023-11-10 | Discharge: 2023-11-21 | DRG: 885 | Disposition: A | Discharge status: Home / Self Care | Payer: MEDICAID | Source: Intra-hospital

## 2023-11-10 DIAGNOSIS — R5381 Other malaise: Secondary | ICD-10-CM | POA: Diagnosis present

## 2023-11-10 DIAGNOSIS — F329 Major depressive disorder, single episode, unspecified: Secondary | ICD-10-CM | POA: Diagnosis present

## 2023-11-10 DIAGNOSIS — Z91030 Bee allergy status: Secondary | ICD-10-CM | POA: Diagnosis not present

## 2023-11-10 DIAGNOSIS — Z91148 Patient's other noncompliance with medication regimen for other reason: Secondary | ICD-10-CM

## 2023-11-10 DIAGNOSIS — Z560 Unemployment, unspecified: Secondary | ICD-10-CM | POA: Diagnosis not present

## 2023-11-10 DIAGNOSIS — F410 Panic disorder [episodic paroxysmal anxiety] without agoraphobia: Secondary | ICD-10-CM | POA: Diagnosis present

## 2023-11-10 DIAGNOSIS — F3164 Bipolar disorder, current episode mixed, severe, with psychotic features: Secondary | ICD-10-CM | POA: Diagnosis not present

## 2023-11-10 DIAGNOSIS — Z823 Family history of stroke: Secondary | ICD-10-CM | POA: Diagnosis not present

## 2023-11-10 DIAGNOSIS — Z818 Family history of other mental and behavioral disorders: Secondary | ICD-10-CM

## 2023-11-10 DIAGNOSIS — Z79899 Other long term (current) drug therapy: Secondary | ICD-10-CM

## 2023-11-10 DIAGNOSIS — R45851 Suicidal ideations: Secondary | ICD-10-CM | POA: Diagnosis present

## 2023-11-10 DIAGNOSIS — Z820 Family history of epilepsy and other diseases of the nervous system: Secondary | ICD-10-CM | POA: Diagnosis not present

## 2023-11-10 DIAGNOSIS — F312 Bipolar disorder, current episode manic severe with psychotic features: Principal | ICD-10-CM | POA: Diagnosis present

## 2023-11-10 DIAGNOSIS — Z7951 Long term (current) use of inhaled steroids: Secondary | ICD-10-CM

## 2023-11-10 DIAGNOSIS — Z8349 Family history of other endocrine, nutritional and metabolic diseases: Secondary | ICD-10-CM | POA: Diagnosis not present

## 2023-11-10 DIAGNOSIS — Z87820 Personal history of traumatic brain injury: Secondary | ICD-10-CM

## 2023-11-10 MED ORDER — DIPHENHYDRAMINE HCL 25 MG PO CAPS: 50.0000 mg | ORAL_CAPSULE | Freq: Three times a day (TID) | ORAL | Status: DC | PRN

## 2023-11-10 MED ORDER — OLANZAPINE 10 MG PO TBDP
20.0000 mg | ORAL_TABLET | Freq: Every day | ORAL | Status: DC
  Administered 2023-11-10: 20 mg via ORAL
  Filled 2023-11-10: qty 2

## 2023-11-10 MED ORDER — MELATONIN 3 MG PO TABS
3.0000 mg | ORAL_TABLET | Freq: Every day | ORAL | Status: DC
  Administered 2023-11-10: 3 mg via ORAL
  Filled 2023-11-10: qty 1

## 2023-11-10 MED ORDER — DIPHENHYDRAMINE HCL 50 MG/ML IJ SOLN: 50.0000 mg | Freq: Three times a day (TID) | INTRAMUSCULAR | Status: DC | PRN

## 2023-11-10 MED ORDER — LORAZEPAM 2 MG/ML IJ SOLN: 2.0000 mg | Freq: Three times a day (TID) | INTRAMUSCULAR | Status: DC | PRN

## 2023-11-10 MED ORDER — DIVALPROEX SODIUM ER 500 MG PO TB24
500.0000 mg | ORAL_TABLET | Freq: Every day | ORAL | Status: DC
  Administered 2023-11-10: 500 mg via ORAL
  Filled 2023-11-10: qty 1

## 2023-11-10 MED ORDER — ZIPRASIDONE MESYLATE 20 MG IM SOLR
10.0000 mg | Freq: Four times a day (QID) | INTRAMUSCULAR | Status: DC | PRN
Start: 2023-11-10 — End: 2023-11-10

## 2023-11-10 MED ORDER — HYDROXYZINE HCL 25 MG PO TABS
50.0000 mg | ORAL_TABLET | Freq: Four times a day (QID) | ORAL | Status: DC | PRN
Start: 2023-11-10 — End: 2023-11-10

## 2023-11-10 MED ORDER — DIAZEPAM 5 MG/ML IJ SOLN: 10.0000 mg | Freq: Four times a day (QID) | INTRAMUSCULAR | Status: DC | PRN

## 2023-11-10 MED ORDER — HALOPERIDOL LACTATE 5 MG/ML IJ SOLN: 10.0000 mg | Freq: Three times a day (TID) | INTRAMUSCULAR | Status: DC | PRN

## 2023-11-10 MED ORDER — ACETAMINOPHEN 325 MG PO TABS: 650.0000 mg | ORAL_TABLET | Freq: Four times a day (QID) | ORAL | Status: DC | PRN

## 2023-11-10 MED ORDER — HALOPERIDOL 5 MG PO TABS: 5.0000 mg | ORAL_TABLET | Freq: Three times a day (TID) | ORAL | Status: DC | PRN

## 2023-11-10 MED ORDER — ALUM & MAG HYDROXIDE-SIMETH 200-200-20 MG/5ML PO SUSP: 30.0000 mL | ORAL | Status: DC | PRN

## 2023-11-10 MED ORDER — TRAZODONE HCL 50 MG PO TABS: 50.0000 mg | ORAL_TABLET | Freq: Every evening | ORAL | Status: DC | PRN

## 2023-11-10 MED ORDER — HYDROXYZINE HCL 50 MG PO TABS
50.0000 mg | ORAL_TABLET | Freq: Four times a day (QID) | ORAL | Status: DC | PRN
  Administered 2023-11-11 – 2023-11-20 (×14): 50 mg via ORAL
  Filled 2023-11-10 (×14): qty 1

## 2023-11-10 MED ORDER — POLYETHYLENE GLYCOL 3350 17 G PO PACK: 17.0000 g | PACK | Freq: Every day | ORAL | Status: DC | PRN

## 2023-11-10 MED ORDER — MAGNESIUM HYDROXIDE 400 MG/5ML PO SUSP: 30.0000 mL | Freq: Every day | ORAL | Status: DC | PRN

## 2023-11-10 MED ORDER — HALOPERIDOL LACTATE 5 MG/ML IJ SOLN: 5.0000 mg | Freq: Three times a day (TID) | INTRAMUSCULAR | Status: DC | PRN

## 2023-11-10 NOTE — ED Provider Notes (Signed)
 Emergency Medicine Observation Re-evaluation Note  Janet Welch is a 48 y.o. female, seen on rounds today.  Pt initially presented to the ED for complaints of Psychiatric Evaluation Currently, the patient is awaiting psychiatric admission.  Physical Exam  BP (!) 110/52   Pulse 89   Temp 98.8 F (37.1 C)   Resp 18   Ht 5' 7 (1.702 m)   Wt 74.6 kg   SpO2 100%   BMI 25.76 kg/m  Physical Exam Resting comfortably  ED Course / MDM  EKG:   I have reviewed the labs performed to date as well as medications administered while in observation.  Recent changes in the last 24 hours include none.  Plan  Current plan is for psychiatric admission.    Suzette Pac, MD 11/10/23 [phone]

## 2023-11-10 NOTE — Plan of Care (Signed)
   Problem: Education: Goal: Mental status will improve Outcome: Progressing Goal: Verbalization of understanding the information provided will improve Outcome: Progressing   Problem: Activity: Goal: Interest or engagement in activities will improve Outcome: Progressing

## 2023-11-10 NOTE — ED Notes (Signed)
Room 504

## 2023-11-10 NOTE — [facility] Group Notes (Signed)
 Adult Psychoeducational Group Note  Date:  11/10/2023 Time:  8:50 PM  Group Topic/Focus:  Wrap-Up Group:   The focus of this group is to help patients review their daily goal of treatment and discuss progress on daily workbooks.  Participation Level:  Active  Participation Quality:  Appropriate  Affect:  Appropriate  Cognitive:  Appropriate  Insight: Appropriate  Engagement in Group:  Engaged  Modes of Intervention:  Discussion  Additional Comments:  Pt told that today was a good day on the unit, the highlight of which was being able to come here. On the subject of goals for the week, Pt mentioned medications, but also said she was still figuring out her goals. Pt rated her day a 5 out of 10.  Janet Welch 11/10/2023, 8:50 PM

## 2023-11-10 NOTE — Progress Notes (Signed)
 Admission: Patient is IVC and was received by nurse. Skin assessment completed by RN. Patient was hesitate answering some questions. Patient reports having suicidal thoughts but no plan. During admission she reported no SI. Reports no prior attempts. Denies HI and VH. She reports having AH with the voices telling her negative things. Patient signed consents and belonging sheet. She reports using a half of gummy CBD and not smoke THC. Vitals are stable. Patient was walked to the unit and oriented to her room.

## 2023-11-10 NOTE — ED Notes (Signed)
 Report called to Obie Trudi Samuel, at [facility] [phone]

## 2023-11-10 NOTE — ED Notes (Signed)
 Pts belongings returned, pt from department with PD to be transported to [facility].

## 2023-11-10 NOTE — ED Notes (Signed)
 Pt in bed, resps even and unlabored, pt denies pain, sitter observing pt, pt oriented to person and place, doesn't know day of the week, re oriented pt, pt denies si or hi at this time, pt has no requests, updated pt on plan of care.

## 2023-11-10 NOTE — Consult Note (Signed)
 Janet Welch  Patient Name: Janet Welch MRN: [id number] DOB: 06/22/1975 DATE OF Consult: 11/10/2023  PRIMARY PSYCHIATRIC DIAGNOSES   1.  Bipolar I Disorder, Mixed, Severe with Psychotic Symptoms   RECOMMENDATIONS  Recommendations: Medication recommendations: Continue current meds, which include:  Depakote  ER, 500 mg at bedtime for mood control; Zyprexa , 20 mg at bedtime for mood control/psychosis.  PRN's:  hydroxyzine , 50 mg q6h PRN anxiety; for agitation/aggression, Geodon  10 mg IM q6h PRN and Valium 10 mg IM q6h PRN. Non-Medication/therapeutic recommendations: Patient will continue to require close observation, given her mood lability. Continue with matter-of-fact emotional support in ED, pending transfer Is inpatient psychiatric hospitalization recommended for this patient? Yes (Explain why): Patient continues to be quite disorganized in her thinking and emotionally, she earlier today she made suicidal statements, as well as trying to take off all her clothes in public. Is another care setting recommended for this patient? (examples may include Crisis Stabilization Unit, Residential/Recovery Treatment, ALF/SNF, Memory Care Unit)  No (Explain why): As above From a psychiatric perspective, is this patient appropriate for discharge to an outpatient setting/resource or other less restrictive environment for continued care?  No (Explain why): As above Follow-Up Telepsychiatry C/L services: We will sign off for now. Please re-consult our service if needed for any concerning changes in the patient's condition, discharge planning, or questions. Communication: Treatment team members (and family members if applicable) who were involved in treatment/care discussions and planning, and with whom we spoke or engaged with via secure text/chat, include the following: Secure message sent to Ms. Georgina, FNP, ED provider and staff, outlining recommendations.   Thank you for involving us  in the  care of this patient. If you have any additional questions or concerns, please call [phone] and ask for the provider on-call.   TELEPSYCHIATRY ATTESTATION & CONSENT   As the provider for this telehealth consult, I attest that I verified the patient's identity using two separate identifiers, introduced myself to the patient, provided my credentials, disclosed my location, and performed this encounter via a HIPAA-compliant, real-time, face-to-face, two-way, interactive audio and video platform and with the full consent and agreement of the patient (or guardian as applicable.)   Patient physical location: [facility] Telehealth provider physical location: home office in state of [place] .  Video start time: 0510h EDT Video end time: 0525h EDT  Total time spent in this encounter was 30 minutes, including record review, clinical interview, behavior observations, discussion of impressions and recommendations (including medications and hospitalization), and consultation/communication with relevant parties   IDENTIFYING DATA  Janet Welch is a 48 y.o. year-old female for whom a psychiatric consultation has been ordered by the primary provider. The patient was identified using two separate identifiers.  CHIEF COMPLAINT/REASON FOR CONSULT   I'm fine, really.   HISTORY OF PRESENT ILLNESS (HPI)  The patient presents with longstanding history of bipolar disorder, mixed type.  Patient denying any problems, but does admit to longstanding treatment for bipolar disorder.  I can't remember what happened today.  States she has been taking her medications.  Yet per paperwork, patient was quite disorganized in her behavior and thinking, and was trying to strip off her clothes in public today.  Told ED doctor on admission that I like being raped and then said that she was going to kill herself by driving a car off the cliff.    Patient reports that she sees a medication provider via Zoom.  Patient denied  any drug or EtOH  use, but UDS positive for cannabis.  Denies psychotic sx's, although at times appears to be talking to inner voices.  No homicidal ideation.  Unclear if she has been taking her medication as prescribed.   PAST PSYCHIATRIC HISTORY  See above Otherwise as per HPI above.  PAST MEDICAL HISTORY  Past Medical History:  Diagnosis Date   Bipolar 1 disorder (HCC)    No pertinent past medical history      HOME MEDICATIONS  Facility Ordered Medications  Medication   [COMPLETED] potassium chloride  SA (KLOR-CON  M) CR tablet 40 mEq   divalproex  (DEPAKOTE  ER) 24 hr tablet 500 mg   melatonin tablet 3 mg   OLANZapine  zydis (ZYPREXA ) disintegrating tablet 20 mg   polyethylene glycol (MIRALAX  / GLYCOLAX ) packet 17 g   hydrOXYzine  (ATARAX ) tablet 50 mg   ziprasidone  (GEODON ) injection 10 mg   diazepam (VALIUM) injection 10 mg   PTA Medications  Medication Sig   melatonin 3 MG TABS tablet Take 1 tablet (3 mg total) by mouth at bedtime.   polyethylene glycol (MIRALAX  / GLYCOLAX ) 17 g packet Take 17 g by mouth daily as needed for moderate constipation.   OLANZapine  zydis (ZYPREXA  ZYDIS) 20 MG disintegrating tablet Take 1 tablet (20 mg total) by mouth at bedtime.   divalproex  (DEPAKOTE  ER) 250 MG 24 hr tablet Take 2 tablets (500 mg total) by mouth daily.     ALLERGIES  Allergies  Allergen Reactions   Bee Venom Dermatitis    SOCIAL & SUBSTANCE USE HISTORY  Social History   Socioeconomic History   Marital status: Married    Spouse name: Not on file   Number of children: Not on file   Years of education: Not on file   Highest education level: Not on file  Occupational History    Comment: [employer]  Tobacco Use   Smoking status: Never   Smokeless tobacco: Never  Vaping Use   Vaping status: Never Used  Substance and Sexual Activity   Alcohol use: Yes    Alcohol/week: 0.0 - 1.0 standard drinks of alcohol    Comment: socially   Drug use: Not Currently    Types: Marijuana    Sexual activity: Yes    Birth control/protection: Surgical  Other Topics Concern   Not on file  Social History Narrative   Not on file   Social Drivers of Health   Financial Resource Strain: Not on file  Food Insecurity: Not on file  Transportation Needs: Not on file  Physical Activity: Not on file  Stress: Not on file  Social Connections: Not on file   Social History   Tobacco Use  Smoking Status Never  Smokeless Tobacco Never   Social History   Substance and Sexual Activity  Alcohol Use Yes   Alcohol/week: 0.0 - 1.0 standard drinks of alcohol   Comment: socially   Social History   Substance and Sexual Activity  Drug Use Not Currently   Types: Marijuana    Additional pertinent information  Lives with husband and three children.   SABRA  FAMILY HISTORY  Family History  Problem Relation Age of Onset   Thyroid  disease Mother    Mental illness Mother    Parkinson's disease Father    Stroke Maternal Grandmother    Stroke Maternal Grandfather    Family Psychiatric History (if known):  My family has problems, but could give no detail.   MENTAL STATUS EXAM (MSE)  Mental Status Exam: General Appearance: Fairly Groomed  Orientation:  Full (Time, Place, and Person)  Memory:  Immediate;   Fair Recent;   Fair Remote;   Fair  Concentration:  Concentration: Poor and Attention Span: Poor  Recall:  Fair  Attention  Poor  Eye Contact:  Fair  Speech:  Normal Rate  Language:  Good  Volume:  Normal  Mood: I'm fine  Affect:  Inappropriate and somewhat elated  Thought Process:  Disorganized and Descriptions of Associations: Loose  Thought Content:  Possible auditory hallucinations  Suicidal Thoughts:  Yes.  with intent/plan  Homicidal Thoughts:  No  Judgement:  Poor  Insight:  Lacking  Psychomotor Activity:  Restlessness  Akathisia:  No  Fund of Knowledge:  Fair    Assets:  Engineer, maintenance Social Support  Cognition:  WNL  ADL's:  Intact  AIMS  (if indicated):       VITALS  Blood pressure (!) 110/52, pulse 89, temperature 98.8 F (37.1 C), resp. rate 18, height 5' 7 (1.702 m), weight 74.6 kg, SpO2 100%.  LABS  Admission on 11/09/2023  Component Date Value Ref Range Status   Sodium 11/09/2023 138  135 - 145 mmol/L Final   Potassium 11/09/2023 3.2 (L)  3.5 - 5.1 mmol/L Final   Chloride 11/09/2023 99  98 - 111 mmol/L Final   CO2 11/09/2023 21 (L)  22 - 32 mmol/L Final   Glucose, Bld 11/09/2023 92  70 - 99 mg/dL Final   Glucose reference range applies only to samples taken after fasting for at least 8 hours.   BUN 11/09/2023 15  6 - 20 mg/dL Final   Creatinine, Ser 11/09/2023 0.97  0.44 - 1.00 mg/dL Final   Calcium 89/79/7974 9.5  8.9 - 10.3 mg/dL Final   Total Protein 89/79/7974 7.1  6.5 - 8.1 g/dL Final   Albumin 89/79/7974 4.5  3.5 - 5.0 g/dL Final   AST 89/79/7974 36  15 - 41 U/L Final   ALT 11/09/2023 19  0 - 44 U/L Final   Alkaline Phosphatase 11/09/2023 63  38 - 126 U/L Final   Total Bilirubin 11/09/2023 1.0  0.0 - 1.2 mg/dL Final   GFR, Estimated 11/09/2023 >60  >60 mL/min Final   Comment: ([facility]ch) Calculated using the CKD-EPI Creatinine Equation (2021)    Anion gap 11/09/2023 18 (H)  5 - 15 Final   Performed at [facility], [address] [place], [place] [address]   Alcohol, Ethyl (B) 11/09/2023 <15  <15 mg/dL Final   Comment: (W[facility]ch) For medical purposes only. Performed at [facility], [address], [place], [place] [address]    Opiates 11/09/2023 NEGATIVE  NEGATIVE Final   Cocaine 11/09/2023 NEGATIVE  NEGATIVE Final   Benzodiazepines 11/09/2023 NEGATIVE  NEGATIVE Final   Amphetamines 11/09/2023 NEGATIVE  NEGATIVE Final   Tetrahydrocannabinol 11/09/2023 POSITIVE (A)  NEGATIVE Final   Barbiturates 11/09/2023 NEGATIVE  NEGATIVE Final   Methadone Scn, Ur 11/09/2023 NEGATIVE  NEGATIVE Final   Fentanyl  11/09/2023 NEGATIVE  NEGATIVE Final   Comment: (W[facility]ch) Drug screen is for Medical Purposes only. Positive  results are preliminary only. If confirmation is needed, notify lab within 5 days.  Drug Class                 Cutoff (ng/mL) Amphetamine and metabolites 1000 Barbiturate and metabolites 200 Benzodiazepine              200 Opiates and metabolites     300 Cocaine and metabolites     300 THC  50 Fentanyl                     5 Methadone                   300  Trazodone  is metabolized in vivo to several metabolites,  including pharmacologically active m-CPP, which is excreted in the  urine.  Immunoassay screens for amphetamines and MDMA have potential  cross-reactivity with these compounds and may provide false positive  result.  Performed at [facility], [address], [place], [place] [address]    WBC 11/09/2023 10.3  4.0 - 10.5 K/uL Final   RBC 11/09/2023 4.48  3.87 - 5.11 MIL/uL Final   Hemoglobin 11/09/2023 13.3  12.0 - 15.0 g/dL Final   HCT 89/79/7974 39.6  36.0 - 46.0 % Final   MCV 11/09/2023 88.4  80.0 - 100.0 fL Final   MCH 11/09/2023 29.7  26.0 - 34.0 pg Final   MCHC 11/09/2023 33.6  30.0 - 36.0 g/dL Final   RDW 89/79/7974 13.4  11.5 - 15.5 % Final   Platelets 11/09/2023 273  150 - 400 K/uL Final   nRBC 11/09/2023 0.0  0.0 - 0.2 % Final   Neutrophils Relative % 11/09/2023 76  % Final   Neutro Abs 11/09/2023 7.7  1.7 - 7.7 K/uL Final   Lymphocytes Relative 11/09/2023 14  % Final   Lymphs Abs 11/09/2023 1.5  0.7 - 4.0 K/uL Final   Monocytes Relative 11/09/2023 10  % Final   Monocytes Absolute 11/09/2023 1.0  0.1 - 1.0 K/uL Final   Eosinophils Relative 11/09/2023 0  % Final   Eosinophils Absolute 11/09/2023 0.0  0.0 - 0.5 K/uL Final   Basophils Relative 11/09/2023 0  % Final   Basophils Absolute 11/09/2023 0.0  0.0 - 0.1 K/uL Final   Immature Granulocytes 11/09/2023 0  % Final   Abs Immature Granulocytes 11/09/2023 0.02  0.00 - 0.07 K/uL Final   Performed at [facility], [address] [place], [place] [address]   Acetaminophen  (Tylenol ), Serum  11/09/2023 <10 (L)  10 - 30 ug/mL Final   Comment: (Welch) Toxic concentrations can be more effectively related to post dose interval; > 200, > 100, and > 50 ug/mL serum concentrations correspond to toxic concentrations at 4, 8, and 12 hours post dose, respectively.  Performed at [facility], [address], [place], [place] [address]    Salicylate Lvl 11/09/2023 <7.0 (L)  7.0 - 30.0 mg/dL Final   Performed at [facility], [address] [place], [place] [address]   Valproic Acid Lvl 11/09/2023 <10 (L)  50 - 100 ug/mL Final   Performed at [facility], [address], [place], [place] [address]   hCG, Beta Chain, Quant, GORMAN 11/09/2023 <1  <5 mIU/mL Final   Comment:          GEST. AGE      CONC.  (mIU/mL)   <=1 WEEK        5 - 50     2 WEEKS       50 - 500     3 WEEKS       100 - 10,000     4 WEEKS     1,000 - 30,000     5 WEEKS     3,500 - 115,000   6-8 WEEKS     12,000 - 270,000    12 WEEKS     15,000 - 220,000  FEMALE AND NON-PREGNANT FEMALE:     LESS THAN 5 mIU/mL Performed at [facility], [address], [place], [place] [address]     PSYCHIATRIC REVIEW OF SYSTEMS (ROS)  ROS: Notable for the following relevant positive findings: Review of Systems  Constitutional: Negative.   HENT: Negative.    Eyes: Negative.   Respiratory: Negative.    Cardiovascular: Negative.   Gastrointestinal: Negative.   Genitourinary: Negative.   Musculoskeletal: Negative.   Skin: Negative.   Neurological: Negative.   Endo/Heme/Allergies: Negative.   Psychiatric/Behavioral:  Positive for depression, hallucinations and suicidal ideas. The patient is nervous/anxious.     Additional findings:      Musculoskeletal: No abnormal movements observed      Gait & Station: Laying/Sitting      Pain Screening: Denies      Nutrition & Dental Concerns: Reviewed   RISK FORMULATION/ASSESSMENT  Is the patient experiencing any suicidal or homicidal ideations: Yes       Explain if yes:   Patient's  thinking is quite disorganized, and she made suicidal statements to ED staff earlier this evening.  Protective factors considered for safety management:   Patient's family has been unable to keep her behaviors under control.  Risk factors/concerns considered for safety management:   Depression Impulsivity Barriers to accessing treatment  Is there a safety management plan with the patient and treatment team to minimize risk factors and promote protective factors: No           Explain: As above, family has been unable to keep patient safe, given her sx's.  Is crisis care placement or psychiatric hospitalization recommended: Yes     Based on my current evaluation and risk assessment, patient is determined at this time to be at:  High risk  *RISK ASSESSMENT Risk assessment is a dynamic process; it is possible that this patient's condition, and risk level, may change. This should be re-evaluated and managed over time as appropriate. Please re-consult psychiatric consult services if additional assistance is needed in terms of risk assessment and management. If your team decides to discharge this patient, please advise the patient how to best access emergency psychiatric services, or to call 911, if their condition worsens or they feel unsafe in any way.   Adriana JINNY Pontes, MD Telepsychiatry Consult Services

## 2023-11-10 NOTE — ED Notes (Signed)
 Pt in bed, meal given, pt denies si or hi, pt denies hallucinations at this time, resps even and unlabored, pt denies pain, pt calm and cooperative.

## 2023-11-10 NOTE — Progress Notes (Signed)
 Pt has been accepted to [facility] on 11/10/2023 Bed assignment: 504-01  Pt meets inpatient criteria per: Adriana Pontes MD  Attending Physician will be: Pashayan MD.   Report can be called un:lwpu:[facility] unit: [phone]  Pt can arrive after [facility] WILL UPDATE   Care Team Notified: [facility] Cherylynn Ernst RN, Dorn Ahle RN. Annalee Larch  RN, Gaines Ada FNP  [place] Demarquez Ciolek LCSW-A   11/10/2023 10:07 AM

## 2023-11-10 NOTE — ED Notes (Signed)
Patient talking with telepsych.

## 2023-11-10 NOTE — ED Notes (Signed)
 Transportation called to [facility], [place] [employer] will come pick her up for transport as soon as possible.

## 2023-11-11 ENCOUNTER — Encounter (HOSPITAL_COMMUNITY): Payer: Self-pay

## 2023-11-11 DIAGNOSIS — F329 Major depressive disorder, single episode, unspecified: Secondary | ICD-10-CM | POA: Diagnosis not present

## 2023-11-11 MED ORDER — RISPERIDONE 2 MG PO TABS
2.0000 mg | ORAL_TABLET | Freq: Two times a day (BID) | ORAL | Status: DC
  Administered 2023-11-11 – 2023-11-15 (×10): 2 mg via ORAL
  Filled 2023-11-11 (×10): qty 1

## 2023-11-11 MED ORDER — DIVALPROEX SODIUM ER 500 MG PO TB24
500.0000 mg | ORAL_TABLET | Freq: Every day | ORAL | Status: DC
Start: 2023-11-11 — End: 2023-11-18
  Administered 2023-11-11 – 2023-11-17 (×7): 500 mg via ORAL
  Filled 2023-11-11 (×7): qty 1

## 2023-11-11 MED ORDER — MELATONIN 3 MG PO TABS
3.0000 mg | ORAL_TABLET | Freq: Every day | ORAL | Status: DC
  Administered 2023-11-11 – 2023-11-18 (×8): 3 mg via ORAL
  Filled 2023-11-11 (×9): qty 1

## 2023-11-11 NOTE — Group Note (Signed)
 Recreation Therapy Group Note   Group Topic:Self-Esteem  Group Date: 11/11/2023 Start Time: 1042 End Time: 1115 Facilitators: Fraya Ueda-McCall, LRT,CTRS Location: [address] Dayroom   Group Topic: Self-esteem  Goal Area(s) Addresses:  Patient will identify and write at least one positive trait about themself. Patient will successfully identify important people, dates and mottos that is important to them. Patient will acknowledge the benefit of healthy self-esteem. Patient will endorse understanding of ways to increase self-esteem.   Behavioral Response: None   Intervention: Personalized Plate- printed license plate template, markers or colored pencils   Activity: LRT began group session with open dialogue asking the patients to define self-esteem and verbally identify positive qualities and traits people may possess. Patients were then instructed to design a personalized license plate, with words and drawings, representing at least 3 positive things about themselves. Pts were encouraged to include favorites, things they are proud of, what they enjoy doing, and dreams for their future. If a patient had a life motto or a meaningful phase that expressed their life values, pt's were asked to incorporate that into their design as well. Patients were given the opportunity to share their completed work with the group.   Education: Healthy self-esteem, Positive character traits, Accepting compliments, Leisure as competence and coping, Support Systems, Discharge planning; LRT educated patients on the importance of healthy self-esteem and ways to build self-esteem. LRT addressed discharge planning reviewing positive coping skills and healthy support systems.  Education Outcome: Acknowledges education/In group clarification offered   Affect/Mood: N/A   Participation Level: Did not attend    Clinical Observations/Individualized Feedback: Pt came in after group wrapped and wanted to try to  complete a license plate. Pt never finished it.      Plan: Continue to engage patient in RT group sessions 2-3x/week.   Jailah Willis-McCall, LRT,CTRS 11/11/2023 3:33 PM

## 2023-11-11 NOTE — Plan of Care (Signed)
   Problem: Education: Goal: Knowledge of [facility] Education information/materials will improve Outcome: Progressing   Problem: Activity: Goal: Interest or engagement in activities will improve Outcome: Progressing   Problem: Safety: Goal: Periods of time without injury will increase Outcome: Progressing

## 2023-11-11 NOTE — Plan of Care (Signed)
   Problem: Education: Goal: Knowledge of [facility] Education information/materials will improve Outcome: Progressing Goal: Emotional status will improve Outcome: Progressing Goal: Mental status will improve Outcome: Progressing Goal: Verbalization of understanding the information provided will improve Outcome: Progressing

## 2023-11-11 NOTE — BH IP Treatment Plan (Signed)
 Interdisciplinary Treatment and Diagnostic Plan Update  11/11/2023 Time of Session: 1042am Janet Welch MRN: [id number]  Principal Diagnosis: MDD (major depressive disorder)  Secondary Diagnoses: Principal Problem:   MDD (major depressive disorder)   Current Medications:  Current Facility-Administered Medications  Medication Dose Route Frequency Provider Last Rate Last Admin   acetaminophen  (TYLENOL ) tablet 650 mg  650 mg Oral Q6H PRN Motley-Mangrum, Jadeka A, PMHNP       alum & mag hydroxide-simeth (MAALOX/MYLANTA) 200-200-20 MG/5ML suspension 30 mL  30 mL Oral Q4H PRN Motley-Mangrum, Jadeka A, PMHNP       haloperidol (HALDOL) tablet 5 mg  5 mg Oral TID PRN Motley-Mangrum, Jadeka A, PMHNP       And   diphenhydrAMINE  (BENADRYL ) capsule 50 mg  50 mg Oral TID PRN Motley-Mangrum, Jadeka A, PMHNP       haloperidol lactate (HALDOL) injection 5 mg  5 mg Intramuscular TID PRN Motley-Mangrum, Jadeka A, PMHNP       And   diphenhydrAMINE  (BENADRYL ) injection 50 mg  50 mg Intramuscular TID PRN Motley-Mangrum, Jadeka A, PMHNP       And   LORazepam  (ATIVAN ) injection 2 mg  2 mg Intramuscular TID PRN Motley-Mangrum, Jadeka A, PMHNP       haloperidol lactate (HALDOL) injection 10 mg  10 mg Intramuscular TID PRN Motley-Mangrum, Jadeka A, PMHNP       And   diphenhydrAMINE  (BENADRYL ) injection 50 mg  50 mg Intramuscular TID PRN Motley-Mangrum, Jadeka A, PMHNP       And   LORazepam  (ATIVAN ) injection 2 mg  2 mg Intramuscular TID PRN Motley-Mangrum, Jadeka A, PMHNP       divalproex  (DEPAKOTE  ER) 24 hr tablet 500 mg  500 mg Oral QHS Motley-Mangrum, Jadeka A, PMHNP   500 mg at 11/10/23 2048   hydrOXYzine  (ATARAX ) tablet 50 mg  50 mg Oral Q6H PRN Motley-Mangrum, Jadeka A, PMHNP       magnesium  hydroxide (MILK OF MAGNESIA) suspension 30 mL  30 mL Oral Daily PRN Motley-Mangrum, Jadeka A, PMHNP       melatonin tablet 3 mg  3 mg Oral QHS Motley-Mangrum, Jadeka A, PMHNP   3 mg at 11/10/23 2048   OLANZapine   zydis (ZYPREXA ) disintegrating tablet 20 mg  20 mg Oral QHS Motley-Mangrum, Jadeka A, PMHNP   20 mg at 11/10/23 2048   polyethylene glycol (MIRALAX  / GLYCOLAX ) packet 17 g  17 g Oral Daily PRN Motley-Mangrum, Jadeka A, PMHNP       traZODone  (DESYREL ) tablet 50 mg  50 mg Oral QHS PRN Motley-Mangrum, Jadeka A, PMHNP       PTA Medications: Medications Prior to Admission  Medication Sig Dispense Refill Last Dose/Taking   divalproex  (DEPAKOTE  ER) 250 MG 24 hr tablet Take 2 tablets (500 mg total) by mouth daily. (Patient not taking: Reported on 11/10/2023) 4 tablet 0    melatonin 3 MG TABS tablet Take 1 tablet (3 mg total) by mouth at bedtime. (Patient not taking: Reported on 11/10/2023) 30 tablet 0    OLANZapine  zydis (ZYPREXA  ZYDIS) 20 MG disintegrating tablet Take 1 tablet (20 mg total) by mouth at bedtime. (Patient not taking: Reported on 11/10/2023) 2 tablet 0    Oxcarbazepine (TRILEPTAL) 300 MG tablet Take 300 mg by mouth at bedtime.      polyethylene glycol (MIRALAX  / GLYCOLAX ) 17 g packet Take 17 g by mouth daily as needed for moderate constipation. (Patient not taking: Reported on 11/10/2023) 30 each 0  Patient Stressors:    Patient Strengths:    Treatment Modalities: Medication Management, Group therapy, Case management,  1 to 1 session with clinician, Psychoeducation, Recreational therapy.   Physician Treatment Plan for Primary Diagnosis: MDD (major depressive disorder) Long Term Goal(s): Improvement in symptoms so as ready for discharge   Short Term Goals: Ability to identify changes in lifestyle to reduce recurrence of condition will improve Ability to verbalize feelings will improve Ability to disclose and discuss suicidal ideas Ability to demonstrate self-control will improve Ability to identify and develop effective coping behaviors will improve Ability to maintain clinical measurements within normal limits will improve Compliance with prescribed medications will  improve Ability to identify triggers associated with substance abuse/mental health issues will improve  Medication Management: Evaluate patient's response, side effects, and tolerance of medication regimen.  Therapeutic Interventions: 1 to 1 sessions, Unit Group sessions and Medication administration.  Evaluation of Outcomes: Not Progressing  Physician Treatment Plan for Secondary Diagnosis: Principal Problem:   MDD (major depressive disorder)  Long Term Goal(s): Improvement in symptoms so as ready for discharge   Short Term Goals: Ability to identify changes in lifestyle to reduce recurrence of condition will improve Ability to verbalize feelings will improve Ability to disclose and discuss suicidal ideas Ability to demonstrate self-control will improve Ability to identify and develop effective coping behaviors will improve Ability to maintain clinical measurements within normal limits will improve Compliance with prescribed medications will improve Ability to identify triggers associated with substance abuse/mental health issues will improve     Medication Management: Evaluate patient's response, side effects, and tolerance of medication regimen.  Therapeutic Interventions: 1 to 1 sessions, Unit Group sessions and Medication administration.  Evaluation of Outcomes: Not Progressing   RN Treatment Plan for Primary Diagnosis: MDD (major depressive disorder) Long Term Goal(s): Knowledge of disease and therapeutic regimen to maintain health will improve  Short Term Goals: Ability to remain free from injury will improve, Ability to verbalize frustration and anger appropriately will improve, Ability to demonstrate self-control, Ability to participate in decision making will improve, Ability to verbalize feelings will improve, Ability to disclose and discuss suicidal ideas, Ability to identify and develop effective coping behaviors will improve, and Compliance with prescribed medications  will improve  Medication Management: RN will administer medications as ordered by provider, will assess and evaluate patient's response and provide education to patient for prescribed medication. RN will report any adverse and/or side effects to prescribing provider.  Therapeutic Interventions: 1 on 1 counseling sessions, Psychoeducation, Medication administration, Evaluate responses to treatment, Monitor vital signs and CBGs as ordered, Perform/monitor CIWA, COWS, AIMS and Fall Risk screenings as ordered, Perform wound care treatments as ordered.  Evaluation of Outcomes: Not Progressing   LCSW Treatment Plan for Primary Diagnosis: MDD (major depressive disorder) Long Term Goal(s): Safe transition to appropriate next level of care at discharge, Engage patient in therapeutic group addressing interpersonal concerns.  Short Term Goals: Engage patient in aftercare planning with referrals and resources, Increase social support, Increase ability to appropriately verbalize feelings, Increase emotional regulation, Facilitate acceptance of mental health diagnosis and concerns, Facilitate patient progression through stages of change regarding substance use diagnoses and concerns, Identify triggers associated with mental health/substance abuse issues, and Increase skills for wellness and recovery  Therapeutic Interventions: Assess for all discharge needs, 1 to 1 time with Social worker, Explore available resources and support systems, Assess for adequacy in community support network, Educate family and significant other(s) on suicide prevention, Complete Psychosocial Assessment, Interpersonal  group therapy.  Evaluation of Outcomes: Not Progressing   Progress in Treatment: Attending groups: Yes. Participating in groups: Yes. Taking medication as prescribed: will begin tonight Toleration medication: na Family/Significant other contact made: No, will contact:  consents pending Patient understands  diagnosis: No. Discussing patient identified problems/goals with staff: Yes. Medical problems stabilized or resolved: Yes. Denies suicidal/homicidal ideation: Yes. Issues/concerns per patient self-inventory: No.  New problem(s) identified: No, Describe:  none  New Short Term/Long Term Goal(s): medication stabilization, elimination of SI thoughts, development of comprehensive mental wellness plan.    Patient Goals:  Get medications right  Discharge Plan or Barriers: Patient recently admitted. CSW will continue to follow and assess for appropriate referrals and possible discharge planning.    Reason for Continuation of Hospitalization: Depression Hallucinations Medication stabilization  Estimated Length of Stay: 5-7 days  Last 3 [place] Suicide Severity Risk Score: Flowsheet Row [facility] Ad[facility] (Discharged) from OP [facility]  C-SSRS RISK CATEGORY Low Risk No Risk No Risk    Last PHQ 2/9 Scores:    02/12/2023    8:22 AM 02/25/2021    6:28 PM 02/20/2021    2:22 PM  Depression screen PHQ 2/9  Decreased Interest 0 2 2  Down, Depressed, Hopeless 0 2 2  PHQ - 2 Score 0 4 4  Altered sleeping  1 3  Tired, decreased energy  1 3  Change in appetite  1 0  Feeling bad or failure about yourself   2 2  Trouble concentrating  2 2  Moving slowly or fidgety/restless  2 3  Suicidal thoughts  0 0  PHQ-9 Score  13 17  Difficult doing work/chores  Somewhat difficult Somewhat difficult    Scribe for Treatment Team: Jenkins LULLA Primer, LCSWA 11/11/2023 12:10 PM

## 2023-11-11 NOTE — BHH Counselor (Signed)
 Adult Comprehensive Assessment  Patient ID: Janet Welch, female   DOB: May 14, 1975, 48 y.o.   MRN: [id number]  Information Source: Information source: Patient  Current Stressors:  Patient states their primary concerns and needs for treatment are:: I had a manic episode Patient states their goals for this hospitilization and ongoing recovery are:: just to figure out what medications I need Educational / Learning stressors: no Employment / Job issues: yes, I don't have a job Family Relationships: some Surveyor, quantity / Lack of resources (include bankruptcy): yes Housing / Lack of housing: I have a home Physical health (include injuries & life threatening diseases): no Social relationships: I feel a little isolated Substance abuse: no Bereavement / Loss: no  Living/Environment/Situation:  Living Arrangements: Spouse/significant other Living conditions (as described by patient or guardian): We have an older home, so we are trying to fix it, but it's overwhleming. Who else lives in the home?: I live with my spouse and 4 children. How long has patient lived in current situation?: since 2017 What is atmosphere in current home: Chaotic  Family History:  Marital status: Married Number of Years Married: 19 What types of issues is patient dealing with in the relationship?: finances, being on the same page, and keeping up with my medications. Additional relationship information: none reported Are you sexually active?: Yes What is your sexual orientation?: Heterosexual Has your sexual activity been affected by drugs, alcohol, medication, or emotional stress?: medications Does patient have children?: Yes How many children?: 4 How is patient's relationship with their children?: Our relationship can be strained at times.  Childhood History:  By whom was/is the patient raised?: Both parents Additional childhood history information: none reported Description of  patient's relationship with caregiver when they were a child: We had a good relationship.  Growing up, I spent a lot of time at my grandparents' home.  We enjoyed spending time with our grandparents. Patient's description of current relationship with people who raised him/her: I haven't talked to my parents recently.  I'm hoping our relationship is good. How were you disciplined when you got in trouble as a child/adolescent?: we were spanked Does patient have siblings?: Yes Number of Siblings: 1 Description of patient's current relationship with siblings: I have a brother.  We always had a pretty good relationship but I haven't talked to them in a while. Did patient suffer any verbal/emotional/physical/sexual abuse as a child?: No Has patient ever been sexually abused/assaulted/raped as an adolescent or adult?: No Was the patient ever a victim of a crime or a disaster?: No Witnessed domestic violence?: No Has patient been affected by domestic violence as an adult?: No  Education:  Highest grade of school patient has completed: I have a master's degree. Currently a student?: No Learning disability?: No  Employment/Work Situation:   Employment Situation: Unemployed Patient's Job has Been Impacted by Current Illness: Yes Describe how Patient's Job has Been Impacted: Jesus been trying to go back to work for a while now. What is the Longest Time Patient has Held a Job?: 19 years Where was the Patient Employed at that Time?: [employer] Has Patient ever Been in the [employer]?: No  Financial Resources:   Surveyor, quantity resources: Support from parents / caregiver, No income, Medicaid, Food stamps Does patient have a Lawyer or guardian?: No  Alcohol/Substance Abuse:   What has been your use of drugs/alcohol within the last 12 months?: no If attempted suicide, did drugs/alcohol play a role in this?: No If yes, describe treatment:  no  Social Support System:    Forensic psychologist System: Fair Museum/gallery exhibitions officer System: my mom and dad, my brother, my husband and his mom.  I have aunts and uncles. Type of faith/religion: I'm Christian How does patient's faith help to cope with current illness?: we go to church  Leisure/Recreation:   Do You Have Hobbies?: Yes Leisure and Hobbies: I like exercising and reading  Strengths/Needs:   What is the patient's perception of their strengths?: I used to be a good listener before the medications. Patient states they can use these personal strengths during their treatment to contribute to their recovery: Medications cause problems with concentration.  If I can listen good, then I can practice at home what you tell me to do. Patient states these barriers may affect/interfere with their treatment: I don't know how to ansewr this question. Patient states these barriers may affect their return to the community: none reported Other important information patient would like considered in planning for their treatment: none reported  Discharge Plan:   Patient states concerns and preferences for aftercare planning are: Medication Management:  [facility], virtual.  Therapist: Allean Lisbeth Rigg, [address], [place], [place] [address], every 2 - 3 weeks in person. Patient states they will know when they are safe and ready for discharge when: when I take care of myself Does patient have access to transportation?: Yes Does patient have financial barriers related to discharge medications?: Yes Will patient be returning to same living situation after discharge?: Yes  Summary/Recommendations:   Summary and Recommendations (to be completed by the evaluator): [name] is a 48 year old woman voluntarily admitted to [facility] due to suicidal ideations without plan.  It was reported that patient experienced auditory hallucinations and the voices were telling her negative  things.  During the assessement, patient was polite and coherent.  She said that she lives with her husband and 4 children.  She said that her husband will pick her up upon discharge.  Her main goal for hospitalization is to adjust her medications.  She said that her main stressors are limited financial resources, and lack of employment. 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  At admission, patient tested positive for marijuana, however during the asssessment, she denied any substance use in the past 12 months.  Patient said that she has a psychiatrist and therapist.  Medication management:  [facility], [facility], virtual.  Therapist: Allean Lisbeth Rigg.  [address], [place], [place] [address], every 2 - 3 weeks, in person.  Patient said that her therapist has been helpful, however she would consider Assertive Commmunity Treatment Team (ACTT) services.  While here, Tiyana Galla can benefit from crisis stabilization, medication management, therapeutic milieu, and referrals for services.   Alexiz Sustaita O Araf Clugston, LCSWA  11/11/2023

## 2023-11-11 NOTE — BHH Group Notes (Signed)
 Adult Psychoeducational Group Note  Date:  11/11/2023 Time:  8:41 PM  Group Topic/Focus:  Wrap-Up Group:   The focus of this group is to help patients review their daily goal of treatment and discuss progress on daily workbooks.  Participation Level:  Did Not Attend  Janet Welch 11/11/2023, 8:41 PM

## 2023-11-11 NOTE — BHH Suicide Risk Assessment (Cosign Needed Addendum)
 Suicide Risk Assessment  Admission Assessment    [facility] Admission Suicide Risk Assessment   Nursing information obtained from:  Patient Demographic factors:  NA Current Mental Status:  Suicidal ideation indicated by patient Loss Factors:  NA Historical Factors:  NA Risk Reduction Factors:  Positive coping skills or problem solving skills  Total Time spent with patient: 1.5 hours Principal Problem: MDD (major depressive disorder) Diagnosis:  Principal Problem:   MDD (major depressive disorder)  Subjective Data: Janet Welch is a 48 year old female with a past psychiatric history significant for bipolar disorder (mixed), major depressive disorder, anxiety disorder, and medication noncompliance. She presented to [facility] with altered mental status and worsening psychotic symptoms, including auditory hallucinations, paranoia, and suicidal ideation.  Subsequently, she was admitted to the [facility] November 11, 2023 under IVC petitioned by her spouse, for further evaluation and stabilization. She has a history of multiple psychiatric hospitalizations, including a recent admission at [facility] in June 2025, and prior admissions at [facility] in February 2023, November 2022, and March 2019.   Continued Clinical Symptoms:  Alcohol Use Disorder Identification Test Final Score (AUDIT): 0 The Alcohol Use Disorders Identification Test, Guidelines for Use in Primary Care, Second Edition.  World Science writer [facility]). Score between 0-7:  no or low risk or alcohol related problems. Score between 8-15:  moderate risk of alcohol related problems. Score between 16-19:  high risk of alcohol related problems. Score 20 or above:  warrants further diagnostic evaluation for alcohol dependence and treatment.   CLINICAL FACTORS:   Severe Anxiety and/or Agitation Panic Attacks Depression:   Anhedonia Hopelessness Impulsivity Insomnia Recent sense of  peace/wellbeing Severe Alcohol/Substance Abuse/Dependencies More than one psychiatric diagnosis Previous Psychiatric Diagnoses and Treatments   Musculoskeletal: Strength & Muscle Tone: within normal limits Gait & Station: normal Patient leans: N/A  Psychiatric Specialty Exam:  Presentation  General Appearance:  Casual  Eye Contact: Fair  Speech: Clear and Coherent  Speech Volume: Normal  Handedness: Right   Mood and Affect  Mood: Depressed; Hopeless  Affect: Congruent   Thought Process  Thought Processes: Coherent; Goal Directed  Descriptions of Associations:Intact  Orientation:Full (Time, Place and Person)  Thought Content:Logical  History of Schizophrenia/Schizoaffective disorder:No data recorded Duration of Psychotic Symptoms:No data recorded Hallucinations:Hallucinations: Auditory Description of Auditory Hallucinations: Hear voices telling me to do staff.  Ideas of Reference:Paranoia  Suicidal Thoughts:Suicidal Thoughts: Yes, Passive SI Passive Intent and/or Plan: Without Intent; Without Plan; Without Means to Carry Out; Without Access to Means  Homicidal Thoughts:Homicidal Thoughts: No   Sensorium  Memory: Immediate Fair; Recent Fair  Judgment: Fair  Insight: Fair   Art therapist  Concentration: Fair  Attention Span: Fair  Recall: [employer] of Knowledge: Fair  Language: Fair   Psychomotor Activity  Psychomotor Activity:Psychomotor Activity: Normal   Assets  Assets: Desire for Improvement; Housing; Leisure Time; Social Support   Sleep  Sleep:Sleep: Poor    Physical Exam: Physical Exam ROS Blood pressure 111/60, pulse 95, temperature 98.1 F (36.7 C), temperature source Oral, resp. rate 16, height 5' 7 (1.702 m), weight 59 kg, SpO2 99%. Body mass index is 20.36 kg/m.   COGNITIVE FEATURES THAT CONTRIBUTE TO RISK:  None    SUICIDE RISK:   Mild:  Suicidal ideation of limited frequency,  intensity, duration, and specificity.  There are no identifiable plans, no associated intent, mild dysphoria and related symptoms, good self-control (both objective and subjective assessment), few other risk factors, and identifiable protective factors,  including available and accessible social support.  PLAN OF CARE: See H&P for assessment and plan.   I certify that inpatient services furnished can reasonably be expected to improve the patient's condition.   Blair Chiquita Hint, NP 11/11/2023, 3:02 PM
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
## 2023-11-11 NOTE — [facility] Suicide Risk Assessment (Signed)
 [facility] INPATIENT:  Family/Significant Other Suicide Prevention Education  Suicide Prevention Education:  Contact Attempts: Janet Welch (husband) [phone], (name of family member/significant other) has been identified by the patient as the family member/significant other with whom the patient will be residing, and identified as the person(s) who will aid the patient in the event of a mental health crisis.  With written consent from the patient, two attempts were made to provide suicide prevention education, prior to and/or following the patient's discharge.  We were unsuccessful in providing suicide prevention education.  A suicide education pamphlet was given to the patient to share with family/significant other.  Date and time of first attempt: 11/11/2023 / 3:32 PM  CSW left a voicemail.  Date and time of second attempt:  11/12/2023, 12:44 PM  CSW left a voicemail.   Janet Welch O Wasim Hurlbut, LCSWA 11/11/2023, 3:31 PM

## 2023-11-11 NOTE — Plan of Care (Addendum)
 Pt observed in dayroom majority of this shift. Appears guarded, minimal and cautious but forwards on interactions. Reports +AH It's faint, it's not loud like before. Denies SI, HI, VH I don't see anything. Reports still being suspicious Yes I am; I had issues with my husband before I came in here. I quit my job of 19 years because I thought we were moving. It's been so much. Tolerates meals, fluids and medication well. Safety checks maintained at Q 15 minutes intervals without outburst. Support, encouragement and reassurance offered to pt.   Problem: Activity: Goal: Sleeping patterns will improve Outcome: Progressing   Problem: Safety: Goal: Periods of time without injury will increase Outcome: Progressing

## 2023-11-12 LAB — URINALYSIS, ROUTINE W REFLEX MICROSCOPIC
Bilirubin Urine: NEGATIVE
Glucose, UA: NEGATIVE mg/dL
Hgb urine dipstick: NEGATIVE
Ketones, ur: 5 mg/dL — AB
Leukocytes,Ua: NEGATIVE
Nitrite: NEGATIVE
Protein, ur: NEGATIVE mg/dL
Specific Gravity, Urine: 1.02 (ref 1.005–1.030)
pH: 6 (ref 5.0–8.0)

## 2023-11-12 LAB — BASIC METABOLIC PANEL WITH GFR
Anion gap: 10 (ref 5–15)
BUN: 10 mg/dL (ref 6–20)
CO2: 26 mmol/L (ref 22–32)
Calcium: 9 mg/dL (ref 8.9–10.3)
Chloride: 105 mmol/L (ref 98–111)
Creatinine, Ser: 0.74 mg/dL (ref 0.44–1.00)
GFR, Estimated: >60 mL/min (ref >60)
Glucose, Bld: 101 mg/dL — ABNORMAL HIGH (ref 70–99)
Potassium: 3.5 mmol/L (ref 3.5–5.1)
Sodium: 141 mmol/L (ref 135–145)

## 2023-11-12 NOTE — Progress Notes (Signed)
 Collateral contact - [facility], [phone]  CSW confirmed that they are serving patients in [place], [place].  CSW sent a referral to fax # [phone].  Patient signed the ROI.   Jayson Waterhouse, LCSWA 11/12/2023

## 2023-11-12 NOTE — Progress Notes (Signed)
   11/12/23 1100  Psych Admission Type (Psych Patients Only)  Admission Status Involuntary  Psychosocial Assessment  Patient Complaints Anxiety  Eye Contact Fair  Facial Expression Anxious  Affect Appropriate to circumstance  Speech Soft  Interaction Cautious  Motor Activity Slow  Appearance/Hygiene Unremarkable  Behavior Characteristics Cooperative  Mood Depressed  Thought Process  Coherency WDL  Content Preoccupation  Delusions None reported or observed  Perception WDL  Hallucination None reported or observed  Judgment Poor  Confusion None  Danger to Self  Current suicidal ideation? Denies  Danger to Others  Danger to Others None reported or observed

## 2023-11-12 NOTE — Plan of Care (Signed)
   Problem: Education: Goal: Emotional status will improve Outcome: Not Progressing Goal: Mental status will improve Outcome: Not Progressing
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
## 2023-11-12 NOTE — Group Note (Signed)
 Recreation Therapy Group Note   Group Topic:Team Building  Group Date: 11/12/2023 Start Time: 1002 End Time: 1030 Facilitators: Decari Duggar-McCall, LRT,CTRS Location: [address] Dayroom   Group Topic: Communication, Team Building, Problem Solving  Goal Area(s) Addresses:  Patient will effectively work with peer towards shared goal.  Patient will identify skills used to make activity successful.  Patient will identify how skills used during activity can be applied to reach post d/c goals.   Behavioral Response: Engaged  Intervention: STEM Activity- Glass blower/designer  Activity: Tallest [employer]. In teams of 5-6, patients were given 11 craft pipe cleaners. Using the materials provided, patients were instructed to compete again the opposing team(s) to build the tallest free-standing structure from floor level. The activity was timed; difficulty increased by Clinical research associate as Production designer, theatre/television/film continued.  Systematically resources were removed with additional directions for example, placing one arm behind their back, working in silence, and shape stipulations. LRT facilitated post-activity discussion reviewing team processes and necessary communication skills involved in completion. Patients were encouraged to reflect how the skills utilized, or not utilized, in this activity can be incorporated to positively impact support systems post discharge.  Education: Pharmacist, community, Scientist, physiological, Discharge Planning   Education Outcome: Acknowledges education/In group clarification offered/Needs additional education.    Affect/Mood: Flat   Participation Level: Engaged   Participation Quality: Independent   Behavior: Appropriate   Speech/Thought Process: Focused   Insight: None   Judgement: None   Modes of Intervention: STEM Activity   Patient Response to Interventions:  Engaged   Education Outcome:  In group clarification offered    Clinical Observations/Individualized  Feedback: Pt was quiet but focused during activity. Pt was following the lead of her peer in the construction of their tower. Pt would assist were needed during activity. Pt didn't offer any suggestions/information during processing. Pt sat quietly and listened.     Plan: Continue to engage patient in RT group sessions 2-3x/week.   Raelle Chambers-McCall, LRT,CTRS 11/12/2023 10:59 AM

## 2023-11-12 NOTE — Progress Notes (Signed)
(  Sleep Hours) - 6.75 (Any PRNs that were needed, meds refused, or side effects to meds)-  Vistaril  (Any disturbances and when (visitation, over night)- n/a (Concerns raised by the patient)- none (SI/HI/AVH)- Trazodone

## 2023-11-12 NOTE — Group Note (Signed)
 Date:  11/12/2023 Time:  8:38 PM  Group Topic/Focus:  Wrap-Up Group:   The focus of this group is to help patients review their daily goal of treatment and discuss progress on daily workbooks.    Participation Level:  Active  Participation Quality:  Appropriate  Affect:  Appropriate  Cognitive:  Appropriate  Insight: Appropriate  Engagement in Group:  Engaged  Modes of Intervention:  Education and Exploration  Additional Comments:  Patient attended and participated in group tonight.  She reports that today her goal was to do things to help her with anxiety.  She took medication and it did help her with anxiety.   Gwenn Chillington Dacosta 11/12/2023, 8:38 PM

## 2023-11-12 NOTE — Progress Notes (Signed)
 Recreation Therapy Notes  INPATIENT RECREATION THERAPY ASSESSMENT  Patient Details Name: Janet Welch MRN: [id number] DOB: 12-30-1975 Today's Date: 11/12/2023       Information Obtained From: Patient  Able to Participate in Assessment/Interview: Yes  Patient Presentation: Alert  Reason for Admission (Per Patient): Other (Comments) (manic episode)  Patient Stressors: Other (Comment) (life)  Coping Skills:   Isolation, Self-Injury, Sports, TV, Music, Exercise, Deep Breathing, Talk, Prayer, Avoidance, Read, Hot Bath/Shower  Leisure Interests (2+):  Individual - Reading, Social - Friends, Individual - Other (Comment) (Exercise in general)  Frequency of Recreation/Participation: Weekly  Awareness of Community Resources:  Yes  Community Resources:  [place], [place]  Current Use: No  If no, Barriers?: Other (Comment) (life)  Expressed Interest in [employer] Information: No  [place] of Residence:  [place]  Patient Main Form of Transportation: Set designer  Patient Strengths:  Good listener  Patient Identified Areas of Improvement:  not procratinate so much  Patient Goal for Hospitalization:  want to go home more healthy  Current SI (including self-harm):  No  Current HI:  No  Current AVH: No  Staff Intervention Plan: Group Attendance, Collaborate with Interdisciplinary Treatment Team  Consent to Intern Participation: N/A   Meet [place]-McCall, LRT,CTRS Chandrea Zellman A Juaquin Ludington-McCall 11/12/2023, 11:34 AM

## 2023-11-12 NOTE — Plan of Care (Signed)
   Problem: Education: Goal: Knowledge of [facility] Education information/materials will improve Outcome: Progressing   Problem: Activity: Goal: Interest or engagement in activities will improve Outcome: Progressing   Problem: Coping: Goal: Ability to verbalize frustrations and anger appropriately will improve Outcome: Progressing   Problem: Safety: Goal: Periods of time without injury will increase Outcome: Progressing

## 2023-11-12 NOTE — Group Note (Signed)
 Date:  11/12/2023 Time:  9:24 AM  Group Topic/Focus:  Goals Group:   The focus of this group is to help patients establish daily goals to achieve during treatment and discuss how the patient can incorporate goal setting into their daily lives to aide in recovery.    Participation Level:  Active  Participation Quality:  Appropriate  Affect:  Appropriate  Cognitive:  Appropriate  Insight: Good  Engagement in Group:  Engaged  Modes of Intervention:  Discussion  Additional Comments:  Patient Attended Orientation Group.  Janet Welch Janet Welch 11/12/2023, 9:24 AM

## 2023-11-13 NOTE — Plan of Care (Signed)
   Problem: Education: Goal: Emotional status will improve Outcome: Progressing Goal: Mental status will improve Outcome: Progressing Goal: Verbalization of understanding the information provided will improve Outcome: Progressing

## 2023-11-13 NOTE — Progress Notes (Addendum)
 Collateral contact - [facility], [phone]  The receptionist confirmed that the referral has been received, and entered into the computer.   Lonni Griffes (Team Lead) [phone] from [facility] called regarding the referral.  He said he will call CSW on Monday, 10/27.  If she is not discharged yet, he may visit on Tuesday, 10/28.  If this is not possible, he would visit her in her home.     Chenel Wernli, LCSWA 11/13/2023

## 2023-11-13 NOTE — Group Note (Signed)
 Date:  11/13/2023 Time:  8:54 PM  Group Topic/Focus:  Wrap-Up Group:   The focus of this group is to help patients review their daily goal of treatment and discuss progress on daily workbooks.    Participation Level:  Active  Participation Quality:  Appropriate  Affect:  Appropriate  Cognitive:  Appropriate  Insight: Appropriate  Engagement in Group:  Engaged  Modes of Intervention:  Education and Exploration  Additional Comments:  Patient attended and participated in group tonight. She reports that today she learn about 2 of her medication she is currently taking.  Gwenn Chillington Dacosta 11/13/2023, 8:54 PM

## 2023-11-13 NOTE — Progress Notes (Signed)
(  Sleep Hours) - 8.75 (Any PRNs that were needed, meds refused, or side effects to meds)- Vistaril , Trazodone  (Any disturbances and when (visitation, over night)- n/a (Concerns raised by the patient)- none (SI/HI/AVH)- Denies
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
## 2023-11-13 NOTE — Group Note (Signed)
 Recreation Therapy Group Note   Group Topic:Leisure Education  Group Date: 11/13/2023 Start Time: 1037 End Time: 1055 Facilitators: Octaviano Mukai-McCall, LRT,CTRS Location: [address] Dayroom   Group Topic: Leisure Education  Goal Area(s) Addresses:  Patient will identify positive leisure and recreation activities.  Patient will identify one positive benefit of participation in leisure activities.   Behavioral Response: Attentive   Intervention: Group Game  Activity: Lyrically Correct. Patients were divided in to 2 groups for game play. LRT used a deck of cards with various questions pertaining to the lyrics of songs. Some of the answers were given in multiple choice form while others were to be guesses. The team with the most right responses wins the game.    Education:  Teacher, English as a foreign language, Special educational needs teacher, Building control surveyor  Education Outcome: Acknowledges education/In group clarification offered/Needs additional education   Affect/Mood: Appropriate   Participation Level: None   Participation Quality: None   Behavior: Attentive    Speech/Thought Process: None   Insight: None   Judgement: None   Modes of Intervention: Competitive Play   Patient Response to Interventions:  Attentive   Education Outcome:  In group clarification offered    Clinical Observations/Individualized Feedback: Pt came in late to group. Pt was attentive during group. Pt didn't offer any answers.      Plan: Continue to engage patient in RT group sessions 2-3x/week.   Tunisha Ruland-McCall, LRT,CTRS 11/13/2023 1:35 PM

## 2023-11-14 NOTE — Group Note (Deleted)
 LCSW Group Therapy Note  Group Date: 11/14/2023 Start Time: 1000 End Time: 1100   Type of Therapy and Topic:  Group Therapy - Healthy vs Unhealthy Coping Skills  Participation Level:  {BHH PARTICIPATION [id number]}   Description of Group The focus of this group was to determine what unhealthy coping techniques typically are used by group members and what healthy coping techniques would be helpful in coping with various problems. Patients were guided in becoming aware of the differences between healthy and unhealthy coping techniques. Patients were asked to identify 2-3 healthy coping skills they would like to learn to use more effectively.  Therapeutic Goals 1. Patients learned that coping is what human beings do all day long to deal with various situations in their lives 2. Patients defined and discussed healthy vs unhealthy coping techniques 3. Patients identified their preferred coping techniques and identified whether these were healthy or unhealthy 4. Patients determined 2-3 healthy coping skills they would like to become more familiar with and use more often. 5. Patients provided support and ideas to each other   Summary of Patient Progress:  During group, *** expressed ***. Patient proved open to input from peers and feedback from CSW. Patient demonstrated *** insight into the subject matter, was respectful of peers, and participated throughout the entire session.   Therapeutic Modalities Cognitive Behavioral Therapy Motivational Interviewing  Camelia Olden, LCSWA 11/14/2023  2:49 PM

## 2023-11-14 NOTE — Progress Notes (Signed)
   11/14/23 0800  Psych Admission Type (Psych Patients Only)  Admission Status Involuntary  Psychosocial Assessment  Patient Complaints Anxiety  Eye Contact Fair  Facial Expression Worried  Affect Appropriate to circumstance  Speech Soft  Interaction Cautious  Motor Activity Fidgety  Appearance/Hygiene Unremarkable  Behavior Characteristics Cooperative  Mood Pleasant  Thought Process  Coherency WDL  Content Preoccupation  Delusions None reported or observed  Perception WDL  Hallucination None reported or observed  Judgment Poor  Confusion None  Danger to Self  Current suicidal ideation? Denies  Danger to Others  Danger to Others None reported or observed

## 2023-11-14 NOTE — Group Note (Signed)
 Date:  11/14/2023 Time:  8:40 AM  Group Topic/Focus:  Goals Group:   The focus of this group is to help patients establish daily goals to achieve during treatment and discuss how the patient can incorporate goal setting into their daily lives to aide in recovery.    Participation Level:  Active  Participation Quality:  Appropriate  Affect:  Appropriate  Cognitive:  Appropriate  Insight: Appropriate  Engagement in Group:  Engaged  Modes of Intervention:  Discussion  Additional Comments:  Janet Welch stated that her goal for today is to go to groups.  Jaydin Boniface C Crosby Oriordan 11/14/2023, 8:40 AM

## 2023-11-14 NOTE — Plan of Care (Signed)
  Problem: Education: Goal: Knowledge of [facility] Education information/materials will improve Outcome: Progressing Goal: Mental status will improve Outcome: Progressing   Problem: Activity: Goal: Interest or engagement in activities will improve Outcome: Progressing Goal: Sleeping patterns will improve Outcome: Progressing   Problem: Safety: Goal: Periods of time without injury will increase Outcome: Progressing
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
## 2023-11-14 NOTE — Progress Notes (Signed)
 Patient attended social work group.

## 2023-11-14 NOTE — Plan of Care (Signed)

## 2023-11-14 NOTE — Group Note (Signed)
 LCSW Group Therapy Note  Group Date: 11/14/2023 Start Time: 1000 End Time: 1045   Type of Therapy and Topic:  Group Therapy - Self care tips  Participation Level:  Active   Description of Group The focus of this group was to determine what unhealthy coping techniques typically are used by group members and what self care techniques would be helpful in coping with various problems. Patients were guided in becoming aware of the differences between healthy and unhealthy coping techniques. Patients were asked to identify 2-3self care  skills they would like to learn to use more effectively.  Therapeutic Goals Patients learned that coping is what human beings do all day long to deal with various situations in their lives Patients defined and discussed healthy vs unhealthy coping techniques Patients identified their preferred coping techniques and identified whether these were healthy or unhealthy Patients determined 2-3 healthy coping skills they would like to become more familiar with and use more often. Patients provided support and ideas to each other   Summary of Patient Progress:  During group, she expressed she does self care. Patient proved open to input from peers and feedback from CSW. Patient demonstrated self care by going on a walk insight into the subject matter, was respectful of peers, and participated throughout the entire session.   Therapeutic Modalities Cognitive Behavioral Therapy Motivational Interviewing  Janet Welch, LCSWA 11/14/2023  2:43 PM

## 2023-11-14 NOTE — Progress Notes (Signed)
(  Sleep Hours) - 8.5 (Any PRNs that were needed, meds refused, or side effects to meds)- atarax  PRN (Any disturbances and when (visitation, over night) (Concerns raised by the patient)- none (SI/HI/AVH)-denies

## 2023-11-14 NOTE — Group Note (Signed)
 Date:  11/14/2023 Time:  8:54 PM  Group Topic/Focus:  Wrap-Up Group:   The focus of this group is to help patients review their daily goal of treatment and discuss progress on daily workbooks.    Participation Level:  Did Not Attend   Janet Welch 11/14/2023, 8:54 PM

## 2023-11-15 DIAGNOSIS — F312 Bipolar disorder, current episode manic severe with psychotic features: Principal | ICD-10-CM

## 2023-11-15 MED ORDER — PROPRANOLOL HCL 10 MG PO TABS
10.0000 mg | ORAL_TABLET | Freq: Two times a day (BID) | ORAL | Status: DC
  Administered 2023-11-15 – 2023-11-20 (×8): 10 mg via ORAL
  Filled 2023-11-15 (×9): qty 1

## 2023-11-15 NOTE — Progress Notes (Signed)
(  Sleep Hours) - 8 (Any PRNs that were needed, meds refused, or side effects to meds)-  PRN Hydroxyzine  (Any disturbances and when (visitation, over night)- None (Concerns raised by the patient)- None (SI/HI/AVH)- Denies. Contracts for safety.

## 2023-11-15 NOTE — Plan of Care (Signed)
   Problem: Education: Goal: Emotional status will improve Outcome: Progressing Goal: Mental status will improve Outcome: Progressing Goal: Verbalization of understanding the information provided will improve Outcome: Progressing
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
## 2023-11-15 NOTE — BHH Group Notes (Signed)
 Adult Psychoeducational Group Note  Date:  11/15/2023 Time:  10:29 AM  Group Topic/Focus:  Goals Group:   The focus of this group is to help patients establish daily goals to achieve during treatment and discuss how the patient can incorporate goal setting into their daily lives to aide in recovery.  Participation Level:  Did Not Attend  Annett Redman Ramsay 11/15/2023, 10:29 AM

## 2023-11-15 NOTE — Progress Notes (Signed)
   11/15/23 1121  Psych Admission Type (Psych Patients Only)  Admission Status Involuntary  Psychosocial Assessment  Patient Complaints Anxiety  Eye Contact Fair  Facial Expression Flat  Affect Appropriate to circumstance  Speech Soft  Interaction Cautious  Motor Activity Other (Comment) (WDL)  Appearance/Hygiene Unremarkable  Behavior Characteristics Cooperative  Mood Anxious;Pleasant  Thought Process  Coherency WDL  Content Preoccupation  Delusions None reported or observed  Perception Hallucinations  Hallucination Auditory  Judgment Poor  Confusion None  Danger to Self  Current suicidal ideation? Denies  Danger to Others  Danger to Others None reported or observed

## 2023-11-15 NOTE — Group Note (Signed)
 Date:  11/15/2023 Time:  8:27 PM  Group Topic/Focus:  Wrap-Up Group:   The focus of this group is to help patients review their daily goal of treatment and discuss progress on daily workbooks.    Participation Level:  Active  Participation Quality:  Appropriate  Affect:  Appropriate  Cognitive:  Appropriate  Insight: Appropriate  Engagement in Group:  Engaged  Modes of Intervention:  Education and Exploration  Additional Comments:  Patient attended and participated in group tonight.  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  Gwenn Chillington Dacosta 11/15/2023, 8:27 PM

## 2023-11-15 NOTE — Plan of Care (Signed)
   Problem: Safety: Goal: Periods of time without injury will increase Outcome: Progressing

## 2023-11-16 ENCOUNTER — Encounter (HOSPITAL_COMMUNITY): Payer: Self-pay

## 2023-11-16 MED ORDER — OLANZAPINE 5 MG PO TABS: 5.0000 mg | ORAL_TABLET | Freq: Every day | ORAL | Status: DC

## 2023-11-16 MED ORDER — POLYETHYLENE GLYCOL 3350 17 G PO PACK
17.0000 g | PACK | Freq: Every day | ORAL | Status: DC
  Administered 2023-11-16 – 2023-11-18 (×2): 17 g via ORAL
  Filled 2023-11-16 (×5): qty 1

## 2023-11-16 MED ORDER — ARIPIPRAZOLE 5 MG PO TABS
5.0000 mg | ORAL_TABLET | Freq: Once | ORAL | Status: AC
  Administered 2023-11-16: 5 mg via ORAL
  Filled 2023-11-16: qty 1

## 2023-11-16 MED ORDER — ARIPIPRAZOLE 10 MG PO TABS
10.0000 mg | ORAL_TABLET | Freq: Every day | ORAL | Status: DC
  Administered 2023-11-17: 10 mg via ORAL
  Filled 2023-11-16: qty 1

## 2023-11-16 NOTE — Plan of Care (Signed)
   Problem: Education: Goal: Emotional status will improve Outcome: Progressing Goal: Mental status will improve Outcome: Progressing Goal: Verbalization of understanding the information provided will improve Outcome: Progressing

## 2023-11-16 NOTE — Group Note (Signed)
 LCSW Group Therapy Note   Group Date: 11/16/2023 Start Time: 1100 End Time: 1200   Participation:  patient was present.  She listened and was respectful but didn't participate in the discussion.  Type of Therapy:  Group Therapy  Topic:  From "One Day" to "Today is Day One": Begin Your Journey to Better Health and Mental Well-Being  Objective:  To educate participants on the importance of routine, sleep, diet, and movement for improving mental health and overall well-being. Encourage goal-setting for small, achievable changes.  3 Goals: Encourage participants to set one small, achievable goal related to sleep, diet, or movement for the coming week. Promote self-awareness by discussing the connection between physical and mental health. Support accountability by having participants share goals with the group.  Summary: Participants engaged in a discussion about lifestyle factors influencing mental health, including routines, sleep, nutrition, and movement. They set personal goals for the week to improve well-being and shared their goals for accountability.  Therapeutic Modalities: Cognitive Behavioral Therapy (CBT) for exploring the relationship between thoughts, feelings, and behaviors. Psychoeducation on lifestyle changes and their impact on mental health. Goal-setting to promote empowerment and motivation.   Janet Welch O [name], LCSWA 11/16/2023  4:38 PM

## 2023-11-16 NOTE — Progress Notes (Addendum)
 Collateral contact - [employer], Lonni Griffes (Team Lead) [phone]   Lonni said that that ACT[facility] social worker will visit patient tomorrow, Tuesday, 10/28 at 3:00 PM.  Lonni requested discharge documents Fax # [phone] Email:  [email or web address]   Yasmen Cortner, LCSWA 11/16/2023

## 2023-11-16 NOTE — Plan of Care (Signed)
   Problem: Education: Goal: Knowledge of [facility] Education information/materials will improve Outcome: Progressing Goal: Emotional status will improve Outcome: Progressing Goal: Mental status will improve Outcome: Progressing Goal: Verbalization of understanding the information provided will improve Outcome: Progressing

## 2023-11-16 NOTE — Group Note (Signed)
 Recreation Therapy Group Note   Group Topic:Leisure Education  Group Date: 11/16/2023 Start Time: 1030 End Time: 1100 Facilitators: Crosley Stejskal-McCall, LRT,CTRS Location: [address] Dayroom   Group Topic: Leisure Education   Goal Area(s) Addresses:  Patient will successfully identify positive leisure and recreation activities.  Patient will acknowledge benefits of participation in healthy leisure activities post discharge.  Patient will actively work with peers toward a shared goal.   Behavioral Response:    Intervention: Competitive Group Game    Activity: Keep It Contractor. Patients were spread out throughout the room and were to remain seated at all times. Patients were to hit the beach ball to each other as if playing volleyball. Patients were to keep the ball going for as long as they could. Patients would be timed throughout the activity and if the ball came to a complete stop, the time would start over.    Education:  Teacher, English As A Foreign Language, Leisure as Merchant [employer] Officer, Programmer, Applications, Building Control Surveyor   Education Outcome: Acknowledges education/In group clarification offered/Needs additional education   Clinical Observations/Individualized Feedback: LRT did not conduct group due to lack of staffing. LRT assisted with dayroom.   Plan: Continue to engage patient in RT group sessions 2-3x/week.   Rasheedah Reis-McCall, LRT,CTRS 11/16/2023 12:42 PM

## 2023-11-16 NOTE — Group Note (Signed)
 Date:  11/16/2023 Time:  8:24 PM  Group Topic/Focus:  Wrap-Up Group:   The focus of this group is to help patients review their daily goal of treatment and discuss progress on daily workbooks.    Participation Level:  Did Not Attend  Participation Quality:  none  Affect:  n/a  Cognitive:  n/a  Insight: None  Engagement in Group:  none  Modes of Intervention:  none  Additional Comments:   Pt was encouraged but refused to attend wrap up group  Patriece Archbold A [place] Thielman 11/16/2023, 8:24 PM

## 2023-11-16 NOTE — Group Note (Signed)
 Date:  11/16/2023 Time:  1:43 PM  Group Topic/Focus:  Social Work  Pt. Attended SW Group    Annett Redman Ramsay 11/16/2023, 1:43 PM

## 2023-11-16 NOTE — BH IP Treatment Plan (Unsigned)
 Interdisciplinary Treatment and Diagnostic Plan Update  11/16/2023 Time of Session: 12:15 PM - UPDATE Janet Welch MRN: [id number]  Principal Diagnosis: Bipolar I disorder, current or most recent episode manic, severe with mood-congruent psychotic features (HCC)  Secondary Diagnoses: Principal Problem:   Bipolar I disorder, current or most recent episode manic, severe with mood-congruent psychotic features (HCC)   Current Medications:  Current Facility-Administered Medications  Medication Dose Route Frequency Provider Last Rate Last Admin   acetaminophen  (TYLENOL ) tablet 650 mg  650 mg Oral Q6H PRN Motley-Mangrum, Jadeka A, PMHNP       alum & mag hydroxide-simeth (MAALOX/MYLANTA) 200-200-20 MG/5ML suspension 30 mL  30 mL Oral Q4H PRN Motley-Mangrum, Jadeka A, PMHNP       [START ON 11/17/2023] ARIPiprazole  (ABILIFY ) tablet 10 mg  10 mg Oral Daily McCarty, Artie, MD       ARIPiprazole  (ABILIFY ) tablet 5 mg  5 mg Oral Once McCarty, Artie, MD       haloperidol (HALDOL) tablet 5 mg  5 mg Oral TID PRN Motley-Mangrum, Jadeka A, PMHNP       And   diphenhydrAMINE  (BENADRYL ) capsule 50 mg  50 mg Oral TID PRN Motley-Mangrum, Jadeka A, PMHNP       haloperidol lactate (HALDOL) injection 5 mg  5 mg Intramuscular TID PRN Motley-Mangrum, Jadeka A, PMHNP       And   diphenhydrAMINE  (BENADRYL ) injection 50 mg  50 mg Intramuscular TID PRN Motley-Mangrum, Jadeka A, PMHNP       And   LORazepam  (ATIVAN ) injection 2 mg  2 mg Intramuscular TID PRN Motley-Mangrum, Jadeka A, PMHNP       haloperidol lactate (HALDOL) injection 10 mg  10 mg Intramuscular TID PRN Motley-Mangrum, Jadeka A, PMHNP       And   diphenhydrAMINE  (BENADRYL ) injection 50 mg  50 mg Intramuscular TID PRN Motley-Mangrum, Jadeka A, PMHNP       And   LORazepam  (ATIVAN ) injection 2 mg  2 mg Intramuscular TID PRN Motley-Mangrum, Jadeka A, PMHNP       divalproex  (DEPAKOTE  ER) 24 hr tablet 500 mg  500 mg Oral QHS Bennett, Christal H, NP   500 mg at  11/15/23 2033   hydrOXYzine  (ATARAX ) tablet 50 mg  50 mg Oral Q6H PRN Motley-Mangrum, Jadeka A, PMHNP   50 mg at 11/15/23 2033   magnesium  hydroxide (MILK OF MAGNESIA) suspension 30 mL  30 mL Oral Daily PRN Motley-Mangrum, Jadeka A, PMHNP       melatonin tablet 3 mg  3 mg Oral QHS Bennett, Christal H, NP   3 mg at 11/15/23 2033   polyethylene glycol (MIRALAX  / GLYCOLAX ) packet 17 g  17 g Oral Daily McCarty, Artie, MD   17 g at 11/16/23 1152   propranolol (INDERAL) tablet 10 mg  10 mg Oral BID Rollene Katz, MD   10 mg at 11/16/23 0841   PTA Medications: Medications Prior to Admission  Medication Sig Dispense Refill Last Dose/Taking   divalproex  (DEPAKOTE  ER) 250 MG 24 hr tablet Take 2 tablets (500 mg total) by mouth daily. (Patient not taking: Reported on 11/10/2023) 4 tablet 0    melatonin 3 MG TABS tablet Take 1 tablet (3 mg total) by mouth at bedtime. (Patient not taking: Reported on 11/10/2023) 30 tablet 0    OLANZapine  zydis (ZYPREXA  ZYDIS) 20 MG disintegrating tablet Take 1 tablet (20 mg total) by mouth at bedtime. (Patient not taking: Reported on 11/10/2023) 2 tablet 0    Oxcarbazepine (TRILEPTAL)  300 MG tablet Take 300 mg by mouth at bedtime.      polyethylene glycol (MIRALAX  / GLYCOLAX ) 17 g packet Take 17 g by mouth daily as needed for moderate constipation. (Patient not taking: Reported on 11/10/2023) 30 each 0     Patient Stressors:    Patient Strengths:    Treatment Modalities: Medication Management, Group therapy, Case management,  1 to 1 session with clinician, Psychoeducation, Recreational therapy.   Physician Treatment Plan for Primary Diagnosis: Bipolar I disorder, current or most recent episode manic, severe with mood-congruent psychotic features (HCC) Long Term Goal(s): Improvement in symptoms so as ready for discharge   Short Term Goals: Ability to identify changes in lifestyle to reduce recurrence of condition will improve Ability to verbalize feelings will  improve Ability to disclose and discuss suicidal ideas Ability to demonstrate self-control will improve Ability to identify and develop effective coping behaviors will improve Ability to maintain clinical measurements within normal limits will improve Compliance with prescribed medications will improve Ability to identify triggers associated with substance abuse/mental health issues will improve  Medication Management: Evaluate patient's response, side effects, and tolerance of medication regimen.  Therapeutic Interventions: 1 to 1 sessions, Unit Group sessions and Medication administration.  Evaluation of Outcomes: Progressing  Physician Treatment Plan for Secondary Diagnosis: Principal Problem:   Bipolar I disorder, current or most recent episode manic, severe with mood-congruent psychotic features (HCC)  Long Term Goal(s): Improvement in symptoms so as ready for discharge   Short Term Goals: Ability to identify changes in lifestyle to reduce recurrence of condition will improve Ability to verbalize feelings will improve Ability to disclose and discuss suicidal ideas Ability to demonstrate self-control will improve Ability to identify and develop effective coping behaviors will improve Ability to maintain clinical measurements within normal limits will improve Compliance with prescribed medications will improve Ability to identify triggers associated with substance abuse/mental health issues will improve     Medication Management: Evaluate patient's response, side effects, and tolerance of medication regimen.  Therapeutic Interventions: 1 to 1 sessions, Unit Group sessions and Medication administration.  Evaluation of Outcomes: Progressing   RN Treatment Plan for Primary Diagnosis: Bipolar I disorder, current or most recent episode manic, severe with mood-congruent psychotic features (HCC) Long Term Goal(s): Knowledge of disease and therapeutic regimen to maintain health will  improve  Short Term Goals: {[facility] RN Tx Plan Short Term Hnjod:[id number]}  Medication Management: RN will administer medications as ordered by provider, will assess and evaluate patient's response and provide education to patient for prescribed medication. RN will report any adverse and/or side effects to prescribing provider.  Therapeutic Interventions: 1 on 1 counseling sessions, Psychoeducation, Medication administration, Evaluate responses to treatment, Monitor vital signs and CBGs as ordered, Perform/monitor CIWA, COWS, AIMS and Fall Risk screenings as ordered, Perform wound care treatments as ordered.  Evaluation of Outcomes: Progressing   LCSW Treatment Plan for Primary Diagnosis: Bipolar I disorder, current or most recent episode manic, severe with mood-congruent psychotic features (HCC) Long Term Goal(s): Safe transition to appropriate next level of care at discharge, Engage patient in therapeutic group addressing interpersonal concerns.  Short Term Goals: Engage patient in aftercare planning with referrals and resources, Increase ability to appropriately verbalize feelings, Facilitate acceptance of mental health diagnosis and concerns, and Identify triggers associated with mental health/substance abuse issues  Therapeutic Interventions: Assess for all discharge needs, 1 to 1 time with Social worker, Explore available resources and support systems, Assess for adequacy in community support network, Educate  family and significant other(s) on suicide prevention, Complete Psychosocial Assessment, Interpersonal group therapy.  Evaluation of Outcomes: Progressing   Progress in Treatment: Attending groups: Yes. Participating in groups: Yes. Taking medication as prescribed: Yes Toleration medication: Yes Family/Significant other contact made: No, will contact:  Hadar Elgersma (husband) [phone].  First attempt: 11/11/2023 / 3:32 PM.  Second attempt:  11/12/2023 / 12:44 PM, Third attempt:   11/13/2023, 9:05 AM    Patient understands diagnosis: No. Discussing patient identified problems/goals with staff: Yes. Medical problems stabilized or resolved: Yes. Denies suicidal/homicidal ideation: Yes. Issues/concerns per patient self-inventory: No.   New problem(s) identified: No, Describe:  none   New Short Term/Long Term Goal(s): medication stabilization, elimination of SI thoughts, development of comprehensive mental wellness plan.      Patient Goals:  Get medications right   Discharge Plan or Barriers: Patient recently admitted. CSW will continue to follow and assess for appropriate referrals and possible discharge planning.      Reason for Continuation of Hospitalization: Depression Hallucinations Medication stabilization   Estimated Length of Stay: 4 - 6 days  Last 3 [place] Suicide Severity Risk Score: Flowsheet Row Ad[facility] (Current) [facility] from 07/19/2023 in [facility] Admission (Discharged) from OP [facility]  C-SSRS RISK CATEGORY Low Risk No Risk No Risk    Last PHQ 2/9 Scores:    02/12/2023    8:22 AM 02/25/2021    6:28 PM 02/20/2021    2:22 PM  Depression screen PHQ 2/9  Decreased Interest 0 2 2  Down, Depressed, Hopeless 0 2 2  PHQ - 2 Score 0 4 4  Altered sleeping  1 3  Tired, decreased energy  1 3  Change in appetite  1 0  Feeling bad or failure about yourself   2 2  Trouble concentrating  2 2  Moving slowly or fidgety/restless  2 3  Suicidal thoughts  0 0  PHQ-9 Score  13 17  Difficult doing work/chores  Somewhat difficult Somewhat difficult    Scribe for Treatment Team: Annai Heick O Arthuro Canelo, LCSWA 11/16/2023 4:57 PM

## 2023-11-16 NOTE — BH Assessment (Signed)
(  Sleep Hours) - 8.5 (Any PRNs that were needed, meds refused, or side effects to meds)-  (Any disturbances and when (visitation, over night)- None (Concerns raised by the patient)- None (SI/HI/AVH)- Denies

## 2023-11-16 NOTE — Progress Notes (Signed)
   11/16/23 1049  Psych Admission Type (Psych Patients Only)  Admission Status Involuntary  Psychosocial Assessment  Patient Complaints Anxiety  Eye Contact Fair  Facial Expression Flat  Affect Appropriate to circumstance  Speech Soft  Interaction Cautious  Motor Activity Other (Comment) (WDL)  Appearance/Hygiene Unremarkable  Behavior Characteristics Cooperative;Calm  Mood Anxious  Thought Process  Coherency WDL  Content Preoccupation  Delusions None reported or observed  Perception WDL  Hallucination None reported or observed  Judgment Poor  Confusion None  Danger to Self  Current suicidal ideation? Denies  Danger to Others  Danger to Others None reported or observed
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
## 2023-11-17 LAB — VALPROIC ACID LEVEL: Valproic Acid Lvl: 38 ug/mL — ABNORMAL LOW (ref 50–100)

## 2023-11-17 MED ORDER — ARIPIPRAZOLE 15 MG PO TABS
15.0000 mg | ORAL_TABLET | Freq: Every day | ORAL | Status: DC
  Administered 2023-11-18 – 2023-11-21 (×4): 15 mg via ORAL
  Filled 2023-11-17 (×4): qty 1

## 2023-11-17 NOTE — BHH Group Notes (Signed)
 Adult Psychoeducational Group Note  Date:  11/17/2023 Time:  9:39 PM  Group Topic/Focus:  Wrap-Up Group:   The focus of this group is to help patients review their daily goal of treatment and discuss progress on daily workbooks.  Participation Level:  Active  Participation Quality:  Appropriate  Affect:  Appropriate  Cognitive:  Appropriate  Insight: Appropriate  Engagement in Group:  Engaged  Modes of Intervention:  Discussion  Additional Comments:  Rhoda said her day was a 5. Goal for today walking and pacing favorite part of the day group Something she likes about her self good listner   What can she do ccntinue recovery she has a good support system  Lang Donia Law 11/17/2023, 9:39 PM

## 2023-11-17 NOTE — BHH Group Notes (Signed)
 Adult Psychoeducational Group Note  Date:  11/17/2023 Time:  10:54 AM  Group Topic/Focus:  Goals Group:   The focus of this group is to help patients establish daily goals to achieve during treatment and discuss how the patient can incorporate goal setting into their daily lives to aide in recovery. Orientation:   The focus of this group is to educate the patient on the purpose and policies of crisis stabilization and provide a format to answer questions about their admission.  The group details unit policies and expectations of patients while admitted.  Participation Level:  Did Not Attend  Participation Quality:    Affect:    Cognitive:    Insight:   Engagement in Group:    Modes of Intervention:    Additional Comments:    Janet Welch 11/17/2023, 10:54 AM

## 2023-11-17 NOTE — Group Note (Signed)
 Recreation Therapy Group Note   Group Topic:Animal Assisted Therapy   Group Date: 11/17/2023 Start Time: 0946 End Time: 1030 Facilitators: Efton Thomley-McCall, LRT,CTRS Location: [address] Dayroom   Animal-Assisted Activity (AAA) Program Checklist/Progress Notes Patient Eligibility Criteria Checklist & Daily Group note for Rec Tx Intervention  AAA/T Program Assumption of Risk Form signed by Patient/ or Parent Legal Guardian Yes  Patient is free of allergies or severe asthma Yes  Patient reports no fear of animals Yes  Patient reports no history of cruelty to animals Yes  Patient understands his/her participation is voluntary Yes  Patient washes hands before animal contact Yes  Patient washes hands after animal contact Yes  Behavioral Response: Attentive   Education: Hand Washing, Appropriate Animal Interaction   Education Outcome: Acknowledges education.    Affect/Mood: Appropriate   Participation Level: Active   Participation Quality: Independent   Behavior: Attentive    Speech/Thought Process: Focused   Insight: Moderate   Judgement: Moderate   Modes of Intervention: Teaching Laboratory Technician   Patient Response to Interventions:  Attentive   Education Outcome:  In group clarification offered    Clinical Observations/Individualized Feedback: Patient attended session and interacted appropriately with therapy dog and peers.    Plan: Continue to engage patient in RT group sessions 2-3x/week.   Jodine Muchmore-McCall, LRT,CTRS 11/17/2023 12:56 PM

## 2023-11-17 NOTE — Group Note (Signed)
 Date:  11/17/2023 Time:  10:58 AM  Group Topic/Focus:  Pet Therapy:   This group aims to reduce stress and anxiety by using trained animals as a therapeutic tool.    Participation Level:  Pt did attend group   Latanja Lehenbauer D Ajanay Farve 11/17/2023, 10:58 AM

## 2023-11-17 NOTE — BH Assessment (Signed)
(  Sleep Hours) - 9.75 (Any PRNs that were needed, meds refused, or side effects to meds)-  (Any disturbances and when (visitation, over night)- None (Concerns raised by the patient)- None (SI/HI/AVH)- Denies

## 2023-11-17 NOTE — Progress Notes (Signed)
   11/17/23 0810  Psych Admission Type (Psych Patients Only)  Admission Status Involuntary  Psychosocial Assessment  Patient Complaints Isolation  Eye Contact Fair  Facial Expression Flat  Affect Preoccupied  Speech Soft  Interaction Cautious  Motor Activity Other (Comment) (WNL fot pt)  Appearance/Hygiene Unremarkable  Behavior Characteristics Appropriate to situation  Mood Preoccupied  Thought Process  Coherency WDL  Content Preoccupation  Delusions None reported or observed  Perception WDL  Hallucination None reported or observed  Judgment Poor  Confusion None  Danger to Self  Current suicidal ideation? Denies  Danger to Others  Danger to Others None reported or observed

## 2023-11-17 NOTE — Group Note (Signed)
 Date:  11/17/2023 Time:  1:17 PM  Group Topic/Focus:   Crisis Planning:   The purpose of this group is to help patients create a crisis plan for use upon discharge or in the future, as needed. More specifically, setting SMART goals and developing a WRAP plan.     Participation Level:  Pt did attend group    Janet Welch D Lakechia Nay 11/17/2023, 1:17 PM

## 2023-11-17 NOTE — Group Note (Signed)
 Date:  11/17/2023 Time:  3:55 PM  Group Topic/Focus: Sleep Hygiene Education Dimensions of Wellness:   The focus of this group is to introduce the topic of wellness and discuss the role each dimension of wellness plays in total health.    Participation Level:  Did Not Attend  Janet Welch 11/17/2023, 3:55 PM

## 2023-11-17 NOTE — Group Note (Signed)
 LCSW Group Therapy Note   Group Date: 11/17/2023 Start Time: 1100 End Time: 1200   Participation:  did not attend  Type of Therapy:  Group Therapy  Topic: Lifestyle:  from "One Day" to "Today is Day One"  Objective:  To promote mental and physical well-being through lifestyle changes in routine, nutrition, sleep, and movement.  Goals: Increase awareness of how lifestyle habits impact mental health. Encourage one small, achievable wellness goal. Support group sharing and accountability.  Summary:  Group members explored how daily habits influence mental health and discussed the importance of starting with small, manageable changes. Participants identified personal goals and shared reflections on improving structure, sleep, diet, and physical activity.  Therapeutic Modalities: CBT - Identifying and challenging all-or-nothing thinking; promoting realistic, helpful thoughts about change. Psychoeducation - Teaching about the impact of sleep, nutrition, movement, and routine on mental health. Motivational Interviewing - Eliciting personal motivation and exploring readiness for change. Goal-Setting - Supporting SMART goals to build self-efficacy and encourage follow-through.   Luellen Howson O Cam Harnden, LCSWA 11/17/2023  12:27 PM
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
## 2023-11-17 NOTE — Plan of Care (Signed)
   Problem: Education: Goal: Knowledge of [facility] Education information/materials will improve Outcome: Progressing Goal: Emotional status will improve Outcome: Progressing Goal: Mental status will improve Outcome: Progressing Goal: Verbalization of understanding the information provided will improve Outcome: Progressing
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
## 2023-11-17 NOTE — Plan of Care (Signed)
  Problem: Education: Goal: Knowledge of [facility] Education information/materials will improve Outcome: Adequate for Discharge Goal: Mental status will improve Outcome: Progressing   Problem: Activity: Goal: Interest or engagement in activities will improve Outcome: Progressing   Problem: Health Behavior/Discharge Planning: Goal: Identification of resources available to assist in meeting health care needs will improve Outcome: Progressing

## 2023-11-18 ENCOUNTER — Other Ambulatory Visit (HOSPITAL_COMMUNITY): Payer: Self-pay

## 2023-11-18 ENCOUNTER — Telehealth (HOSPITAL_COMMUNITY): Payer: Self-pay | Admitting: Pharmacy Technician

## 2023-11-18 NOTE — Group Note (Signed)
 Date:  11/18/2023 Time:  1:51 PM  Group Topic/Focus: Strength. Chapline Group The focus of this group is to identify what feelings patients have difficulty handling and develop a plan to handle them in a healthier way upon discharge.    Participation Level:  Did Not Attend   Janet Welch 11/18/2023, 1:51 PM

## 2023-11-18 NOTE — Telephone Encounter (Signed)
 Patient Product/process Development Scientist completed.    The patient is insured through [employer] [place].     Ran test claim for Abilify  Maintena 300 mg Prsy and Requires Prior Authorization   This test claim was processed through [employer]- copay amounts may vary at other pharmacies due to [employer], or as the patient moves through the different stages of their insurance plan.     Reyes Sharps, CPHT Pharmacy Technician Patient Advocate Specialist Lead [facility] Patient Advocate Team Direct Number: [phone]  Fax: [phone]

## 2023-11-18 NOTE — Progress Notes (Signed)
(  Sleep Hours) - 8 (Any PRNs that were needed, meds refused, or side effects to meds)- PRN vistaril  50 mg given at pt request, propranolol 10 mg held due to low BP of 113/47. No meds refused.  (Any disturbances and when (visitation, over night)- None  (Concerns raised by the patient)- None  (SI/HI/AVH)- Denies SI/HI/AVH

## 2023-11-18 NOTE — Plan of Care (Signed)
   Problem: Education: Goal: Emotional status will improve Outcome: Progressing Goal: Mental status will improve Outcome: Progressing

## 2023-11-18 NOTE — [facility] Suicide Risk Assessment (Addendum)
 [facility] INPATIENT:  Family/Significant Other Suicide Prevention Education  Suicide Prevention Education:  Education Completed; Arianna Haydon (husband) [phone],  (name of family member/significant other) has been identified by the patient as the family member/significant other with whom the patient will be residing, and identified as the person(s) who will aid the patient in the event of a mental health crisis (suicidal ideations/suicide attempt).  With written consent from the patient, the family member/significant other has been provided the following suicide prevention education, prior to the and/or following the discharge of the patient.  CSW advised husband to lock up guns and weapons.  The suicide prevention education provided includes the following: Suicide risk factors Suicide prevention and interventions National Suicide Hotline telephone number [facility] assessment telephone number [facility] Emergency Assistance 911 [facility] and/or Residential Mobile Crisis Unit telephone number  Request made of family/significant other to: Remove weapons (e.g., guns, rifles, knives), all items previously/currently identified as safety concern.   Remove drugs/medications (over-the-counter, prescriptions, illicit drugs), all items previously/currently identified as a safety concern.  The family member/significant other verbalizes understanding of the suicide prevention education information provided.  The family member/significant other agrees to remove the items of safety concern listed above.  Trica Usery O Eriq Hufford, LCSWA 11/16/2023

## 2023-11-18 NOTE — Group Note (Deleted)
 Date:  11/18/2023 Time:  10:44 AM  Group Topic/Focus:  Orientation:   The focus of this group is to educate the patient on the purpose and policies of crisis stabilization and provide a format to answer questions about their admission.  The group details unit policies and expectations of patients while admitted. Did a worksheet for spiritual health wellness.    Participation Level:  Active  Participation Quality:  Appropriate  Affect:  Appropriate  Cognitive:  Alert  Insight: Appropriate  Engagement in Group:  Improving  Modes of Intervention:  Orientation  Additional Comments:  Patient attended Group and participated in the assessment.  Jacolyn Joaquin M Avik Leoni 11/18/2023, 10:44 AM
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
## 2023-11-18 NOTE — Progress Notes (Signed)
 Tour of Duty:  Prentice JINNY Angle, RN, 11/18/23, Tour of Duty: 0700-1900  SI/HI/AVH: Denies  Self-Reported   Mood: Positive  Anxiety: Endorses Depression: Denies Irritability: Denies  Broset  Violence Prevention Guidelines *See Row Information*: Small Violence Risk interventions implemented   LBM  Last BM Date : 11/18/23   Pain: not present  Patient Refusals (including Rx): No  >>Shift Summary: Patient observed to be calm on unit. Patient able to make needs known. Patient observed to engage appropriately with staff and peers. Patient taking medications as prescribed. This shift, no PRN medication requested or required. No reported or observed side effects to medication. No reported or observed agitation, aggression, or other acute emotional distress. No reported or observed physical abnormalities or concerns.  Last Vitals  Vitals Weight: 59 kg Temp: 98.3 F (36.8 C) Temp Source: Oral Pulse Rate: 74 Resp: 20 BP: 114/71 Patient Position: (not recorded)  Admission Type  Psych Admission Type (Psych Patients Only) Admission Status: Involuntary Date 72 hour document signed : (not recorded) Time 72 hour document signed : (not recorded) Provider Notified (First and Last Name) (see details for LINK to note): (not recorded)   Psychosocial Assessment  Psychosocial Assessment Patient Complaints: None Eye Contact: Fair Facial Expression: Flat Affect: Anxious Speech: Logical/coherent Interaction: Assertive Motor Activity: Other (Comment) (WDL) Appearance/Hygiene: Unremarkable Behavior Characteristics: Cooperative Mood: Anxious   Aggressive Behavior  Targets: (not recorded)   Thought Process  Thought Process Coherency: Within Defined Limits Content: Within Defined Limits Delusions: None reported or observed Perception: Within Defined Limits Hallucination: None reported or observed Judgment: Poor Confusion: None  Danger to Self/Others  Danger to Self Current  suicidal ideation?: Denies Description of Suicide Plan: (not recorded) Self-Injurious Behavior: (not recorded) Agreement Not to Harm Self: (not recorded) Description of Agreement: (not recorded) Danger to Others: None reported or observed

## 2023-11-18 NOTE — Group Note (Deleted)
 Date:  11/18/2023 Time:  10:58 AM  Group Topic/Focus:  Developing a Wellness Toolbox:   The focus of this group is to help patients develop a wellness toolbox with skills and strategies to promote recovery upon discharge. Along will doing a spiritual wellness assesment    Participation Level:  {BHH PARTICIPATION LEVEL:22264}  Participation Quality:  {BHH PARTICIPATION QUALITY:22265}  Affect:  {BHH AFFECT:22266}  Cognitive:  {BHH COGNITIVE:22267}  Insight: {BHH Insight2:20797}  Engagement in Group:  {BHH ENGAGEMENT IN HMNLE:77731}  Modes of Intervention:  {BHH MODES OF INTERVENTION:22269}  Additional Comments:  ***  Terrah Decoster M Alen Matheson 11/18/2023, 10:58 AM

## 2023-11-18 NOTE — Progress Notes (Signed)
   11/17/23 2100  Psych Admission Type (Psych Patients Only)  Admission Status Involuntary  Psychosocial Assessment  Patient Complaints Anxiety  Eye Contact Fair  Facial Expression Flat  Affect Anxious;Preoccupied  Speech Logical/coherent  Interaction Assertive  Motor Activity Other (Comment) (WDL)  Appearance/Hygiene Unremarkable  Behavior Characteristics Cooperative;Calm  Mood Anxious  Thought Process  Coherency WDL  Content Preoccupation  Delusions None reported or observed  Perception WDL  Hallucination None reported or observed  Judgment Poor  Confusion None  Danger to Self  Current suicidal ideation? Denies  Danger to Others  Danger to Others None reported or observed

## 2023-11-18 NOTE — Group Note (Signed)
 Recreation Therapy Group Note   Group Topic:Problem Solving  Group Date: 11/18/2023 Start Time: 0945 End Time: 1005 Facilitators: Antonique Langford-McCall, LRT,CTRS Location: [address] Dayroom   Group Topic: Problem Solving  Goal Area(s) Addresses:  Patient will effectively work in a team with other group members. Patient will verbalize importance of using appropriate problem solving techniques.  Patient will identify positive change associated with effective problem solving skills.   Behavioral Response: Active  Intervention: Worksheet  Activity: Dentist. Patients were given a sheet front and back of brain teasers. Patients worked together (or alone) to figure out each puzzle presented.     Education: Problem solving as an important skill in daily life  Education Outcome: Acknowledges understanding/In group clarification offered/Needs additional education.    Affect/Mood: Appropriate   Participation Level: Active   Participation Quality: Independent   Behavior: Appropriate   Speech/Thought Process: Focused   Insight: Good   Judgement: Good   Modes of Intervention: Group work   Patient Response to Interventions:  Attentive   Education Outcome:  In group clarification offered    Clinical Observations/Individualized Feedback: Pt was quiet for the most part but would make suggestions at times. Pt was focused on the responses of her peers as they completed the activity.      Plan: Continue to engage patient in RT group sessions 2-3x/week.   Jerell Demery-McCall, LRT,CTRS 11/18/2023 1:32 PM

## 2023-11-18 NOTE — Plan of Care (Signed)
   Problem: Education: Goal: Emotional status will improve Outcome: Progressing Goal: Mental status will improve Outcome: Progressing Goal: Verbalization of understanding the information provided will improve Outcome: Progressing   Problem: Activity: Goal: Interest or engagement in activities will improve Outcome: Progressing

## 2023-11-18 NOTE — Group Note (Signed)
 Date:  11/18/2023 Time:  11:18 AM  Group Topic/Focus:  Developing a Wellness Toolbox:   The focus of this group is to help patients develop a wellness toolbox with skills and strategies to promote recovery upon discharge. Orientation:   The focus of this group is to educate the patient on the purpose and policies of crisis stabilization and provide a format to answer questions about their admission.  The group details unit policies and expectations of patients while admitted.  Participation Level:  Active  Participation Quality:  Appropriate  Affect:  Appropriate  Cognitive:  Alert and Appropriate  Insight: Appropriate and Good  Engagement in Group:  Engaged  Modes of Intervention:  Orientation  Additional Comments:  Patient attended Group and did assessment task with spiritual wellness.   Afrah Burlison M Jozelyn Kuwahara 11/18/2023, 11:18 AM

## 2023-11-18 NOTE — Group Note (Signed)
 Date:  11/18/2023 Time:  12:00 PM  Group Topic/Focus: Recreational Therapy  Participation Level:  Did Not Attend   Dolores CHRISTELLA Fredericks 11/18/2023, 12:00 PM

## 2023-11-18 NOTE — Group Note (Signed)
 Date:  11/18/2023 Time:  4:32 PM  Group Topic/Focus:  Dimensions of Wellness:   The focus of this group is to introduce the topic of wellness and discuss the role each dimension of wellness plays in total health.    Participation Level:  Active  Participation Quality:  Appropriate  Affect:  Appropriate  Cognitive:  Appropriate  Insight: Appropriate  Engagement in Group:  Engaged  Modes of Intervention:  Discussion   Janet Welch  Janet Welch 11/18/2023, 4:32 PM

## 2023-11-18 NOTE — BHH Group Notes (Signed)
 Adult Psychoeducational Group Note  Date:  11/18/2023 Time:  9:07 PM  Group Topic/Focus:  Wrap-Up Group:   The focus of this group is to help patients review their daily goal of treatment and discuss progress on daily workbooks.  Participation Level:  Active  Participation Quality:  Appropriate  Affect:  Appropriate  Cognitive:  Appropriate  Insight: Appropriate  Engagement in Group:  Engaged  Modes of Intervention:  Discussion  Additional Comments:  Armelia said her  day was a 6. Goal for today work on her discharge plan. Coping skills walking and praying Favorite part of the day reaction therapy. Something you like about yourself I am a good listener continue recobery have a good support system  Lang Drilling Long 11/18/2023, 9:07 PM

## 2023-11-19 ENCOUNTER — Telehealth (HOSPITAL_COMMUNITY): Payer: Self-pay | Admitting: Pharmacy Technician

## 2023-11-19 ENCOUNTER — Other Ambulatory Visit (HOSPITAL_COMMUNITY): Payer: Self-pay

## 2023-11-19 MED ORDER — ARIPIPRAZOLE ER 400 MG IM SRER
300.0000 mg | INTRAMUSCULAR | Status: DC
  Administered 2023-11-20: 300 mg via INTRAMUSCULAR
  Filled 2023-11-19: qty 2

## 2023-11-19 MED ORDER — TRAZODONE HCL 50 MG PO TABS
50.0000 mg | ORAL_TABLET | Freq: Every evening | ORAL | Status: DC | PRN
Start: 2023-11-19 — End: 2023-11-20
  Administered 2023-11-19: 50 mg via ORAL
  Filled 2023-11-19: qty 1

## 2023-11-19 MED ORDER — ARIPIPRAZOLE ER 400 MG IM SRER: 300.0000 mg | INTRAMUSCULAR | Status: DC

## 2023-11-19 NOTE — Group Note (Signed)
 LCSW Group Therapy Note   Group Date: 11/19/2023 Start Time: 1100 End Time: 1200   Participation:  patient was present.  She listened and was respectful but didn't participate in the discussion.  Type of Therapy:  Group Therapy  Topic:  Stress Less:  Nurturing Your Mind and Body Through Calm   Objective:  Learn techniques for managing stress through body relaxation, mindfulness, and self-compassion.  Goals: Use body relaxation techniques, such as Box Breathing and Progressive Muscle Relaxation, to reduce physical tension. Practice mindfulness to break the cycle of overthinking and mental chatter. Embrace self-compassion to handle stress with kindness and resilience.  Summary:  Today's session focused on calming the body with relaxation techniques, breaking the cycle of stress with mindfulness, and using self-compassion to manage challenges more gracefully. These tools help reduce stress and foster a balanced, peaceful mindset.  Therapeutic Modalities used:  Elements of CBT ( cognitive restructuring)  Elements of DBT (box breathing, progressive body relaxation, mindfulness, acceptance)    Faraaz Wolin O Sunnie Odden, LCSWA 11/19/2023  12:27 PM

## 2023-11-19 NOTE — Telephone Encounter (Signed)
 Pharmacy Patient Advocate Encounter  Received notification from [employer] that Prior Authorization for Abilify  Maintena 300MG  er suspension  has been APPROVED from 11/19/2023 to 11/18/2024. Ran test claim, Copay is $4.00. This test claim was processed through [facility]- copay amounts may vary at other pharmacies due to pharmacy/plan contracts, or as the patient moves through the different stages of their insurance plan.   PA #/Case ID/Reference #: [id number]

## 2023-11-19 NOTE — Plan of Care (Signed)
   Problem: Education: Goal: Emotional status will improve Outcome: Progressing Goal: Mental status will improve Outcome: Progressing Goal: Verbalization of understanding the information provided will improve Outcome: Progressing   Problem: Activity: Goal: Interest or engagement in activities will improve Outcome: Progressing

## 2023-11-19 NOTE — Progress Notes (Signed)

## 2023-11-19 NOTE — Group Note (Signed)
 Occupational Therapy Group Note  Group Topic: Sleep Hygiene  Group Date: 11/19/2023 Start Time: 1500 End Time: 1530 Facilitators: Dot [place] MATSU, OT   Group Description: Group encouraged increased participation and engagement through topic focused on sleep hygiene. Patients reflected on the quality of sleep they typically receive and identified areas that need improvement. Group was given background information on sleep and sleep hygiene, including common sleep disorders. Group members also received information on how to improve one's sleep and introduced a sleep diary as a tool that can be utilized to track sleep quality over a length of time. Group session ended with patients identifying one or more strategies they could utilize or implement into their sleep routine in order to improve overall sleep quality.        Therapeutic Goal(s):  Identify one or more strategies to improve overall sleep hygiene  Identify one or more areas of sleep that are negatively impacted (sleep too much, too little, etc)     Participation Level: Engaged   Participation Quality: Independent   Behavior: Appropriate   Speech/Thought Process: Relevant   Affect/Mood: Appropriate   Insight: Fair   Judgement: Fair      Modes of Intervention: Education  Patient Response to Interventions:  Attentive   Plan: Continue to engage patient in OT groups 2 - 3x/week.  11/19/2023  [place] MATSU Dot, OT   Janet Welch, OT

## 2023-11-19 NOTE — Progress Notes (Signed)
(  Sleep Hours) - 8 (Any PRNs that were needed, meds refused, or side effects to meds)- PRN vistaril  50 mg given at pt request, no meds refused.  (Any disturbances and when (visitation, over night)- None  (Concerns raised by the patient)- None  (SI/HI/AVH)- Denies SI/HI/AVH

## 2023-11-19 NOTE — Group Note (Signed)
 Date:  11/19/2023 Time:  10:13 AM  Group Topic/Focus:  Goals Group:   The focus of this group is to help patients establish daily goals to achieve during treatment and discuss how the patient can incorporate goal setting into their daily lives to aide in recovery.    Participation Level:  Did Not Attend  Participation Quality:  Did Not Attend  Affect:  Did Not Attend  Cognitive:  Did Not Attend  Insight: None  Engagement in Group:  Did Not Attend  Modes of Intervention:  Did Not Attend  Additional Comments:  Did Not Attend  Ayson Cherubini A Muad Noga 11/19/2023, 10:13 AM

## 2023-11-19 NOTE — Telephone Encounter (Signed)
 Pharmacy Patient Advocate Encounter   Received notification from Inpatient Request that prior authorization for Abilify  Maintena 300MG  er suspension  is required/requested.   Insurance verification completed.   The patient is insured through [employer].   Per test claim: PA required; PA submitted to above mentioned insurance via Latent Key/confirmation #/EOC [id number] Status is pending

## 2023-11-19 NOTE — Progress Notes (Signed)
   11/19/23 0900  Psych Admission Type (Psych Patients Only)  Admission Status Involuntary  Psychosocial Assessment  Patient Complaints Sleep disturbance  Eye Contact Fair  Facial Expression Flat  Affect Anxious  Speech Logical/coherent  Interaction Assertive  Motor Activity Other (Comment) (WDL)  Appearance/Hygiene Unremarkable  Behavior Characteristics Cooperative  Mood Anxious  Thought Process  Coherency WDL  Content WDL  Delusions None reported or observed  Perception WDL  Hallucination None reported or observed  Judgment Poor  Confusion None  Danger to Self  Current suicidal ideation? Denies  Danger to Others  Danger to Others None reported or observed

## 2023-11-19 NOTE — Progress Notes (Signed)
   11/18/23 2105  Psych Admission Type (Psych Patients Only)  Admission Status Involuntary  Psychosocial Assessment  Patient Complaints None  Eye Contact Fair  Facial Expression Flat  Affect Anxious  Speech Logical/coherent  Interaction Assertive  Motor Activity Other (Comment) (WDL)  Appearance/Hygiene Unremarkable  Behavior Characteristics Cooperative  Mood Anxious  Thought Process  Coherency WDL  Content WDL  Delusions None reported or observed  Perception WDL  Hallucination None reported or observed  Judgment Poor  Confusion None  Danger to Self  Current suicidal ideation? Denies  Danger to Others  Danger to Others None reported or observed

## 2023-11-19 NOTE — [facility] Group Notes (Signed)
 [facility] Group Notes:  (Nursing/MHT/Case Management/Adjunct)  Date:  11/19/2023  Time:  10:50 PM  Type of Therapy:  Psychoeducational Skills  Participation Level:  Active  Participation Quality:  Attentive  Affect:  Appropriate  Cognitive:  Appropriate  Insight:  Appropriate  Engagement in Group:  Engaged  Modes of Intervention:  Education  Summary of Progress/Problems: The patient rated her day as a 7 out of a possible 10. She verbalized that she was able to speak with her doctor today and talked to her family as well.   [name] S 11/19/2023, 10:50 PM

## 2023-11-19 NOTE — Plan of Care (Signed)

## 2023-11-19 NOTE — Group Note (Signed)
 Date:  11/19/2023 Time:  1:48 PM  Group Topic/Focus:  Emotional Education:   The focus of this group is to discuss what feelings/emotions are, and how they are experienced.    Participation Level:  Active  Participation Quality:  Appropriate  Affect:  Appropriate  Cognitive:  Appropriate  Insight: Appropriate  Engagement in Group:  Engaged  Modes of Intervention:  Discussion  Additional Comments:  N/A  Jordayn Mink A Kyng Matlock 11/19/2023, 1:48 PM
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
## 2023-11-20 ENCOUNTER — Encounter (HOSPITAL_COMMUNITY): Payer: Self-pay

## 2023-11-20 MED ORDER — TRAZODONE HCL 100 MG PO TABS: 100.0000 mg | ORAL_TABLET | Freq: Every evening | ORAL | Status: DC | PRN

## 2023-11-20 MED ORDER — TRAZODONE HCL 100 MG PO TABS
100.0000 mg | ORAL_TABLET | Freq: Every evening | ORAL | Status: DC | PRN
  Administered 2023-11-20: 100 mg via ORAL
  Filled 2023-11-20: qty 1

## 2023-11-20 NOTE — Progress Notes (Addendum)
 D: Patient is alert, oriented, pleasant, and cooperative. Denies SI, HI, AVH, and verbally contracts for safety. Patient reports she slept fair last night without sleeping medication. Patient reports her appetite as good, energy level as low, and concentration as fair. Patient rates her depression 2/10, hopelessness 2/10, and anxiety 2/10.    A: Scheduled medications administered per MD order, including abilify  maintena. Support provided. Patient educated on safety on the unit and medications. Routine safety checks every 15 minutes. Patient stated understanding to tell nurse about any new physical symptoms. Patient understands to tell staff of any needs.     R: No adverse drug reactions noted. Patient remains safe at this time and will continue to monitor.    11/20/23 1100  Psych Admission Type (Psych Patients Only)  Admission Status Involuntary  Psychosocial Assessment  Patient Complaints Sleep disturbance  Eye Contact Fair  Facial Expression Flat  Affect Anxious  Speech Logical/coherent  Interaction Assertive  Motor Activity Other (Comment) (WNL)  Appearance/Hygiene Unremarkable  Behavior Characteristics Cooperative  Mood Anxious;Pleasant  Thought Process  Coherency WDL  Content WDL  Delusions None reported or observed  Perception WDL  Hallucination None reported or observed  Judgment Poor  Confusion None  Danger to Self  Current suicidal ideation? Denies  Danger to Others  Danger to Others None reported or observed

## 2023-11-20 NOTE — Group Note (Signed)
 Date:  11/20/2023 Time:  11:34 AM  Group Topic/Focus:  Goals Group:   The focus of this group is to help patients establish daily goals to achieve during treatment and discuss how the patient can incorporate goal setting into their daily lives to aide in recovery.    Participation Level:  Active  Participation Quality:  Attentive  Affect:  Appropriate  Cognitive:  Alert  Insight: Good  Engagement in Group:  Engaged  Modes of Intervention:  Education  Additional Comments:    Janet Welch 11/20/2023, 11:34 AM
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
## 2023-11-20 NOTE — Group Note (Signed)
 Date:  11/20/2023 Time:  7:02 PM  Group Topic/Focus: 5 Love languages Healthy Communication:   The focus of this group is to discuss communication, barriers to communication, as well as healthy ways to communicate with others.    Participation Level:  Did Not Attend  Janet Welch 11/20/2023, 7:02 PM

## 2023-11-20 NOTE — Plan of Care (Signed)
   Problem: Education: Goal: Emotional status will improve Outcome: Progressing Goal: Mental status will improve Outcome: Progressing Goal: Verbalization of understanding the information provided will improve Outcome: Progressing   Problem: Activity: Goal: Interest or engagement in activities will improve Outcome: Progressing

## 2023-11-20 NOTE — Group Note (Signed)
 Date:  11/20/2023 Time:  12:27 PM  Group Topic/Focus:  Wellness Toolbox:   The focus of this group is to discuss various aspects of wellness, balancing those aspects and exploring ways to increase the ability to experience wellness.  Patients will create a wellness toolbox for use upon discharge.    Participation Level:  Active  Participation Quality:  Appropriate  Affect:  Appropriate  Cognitive:  Appropriate  Insight: Appropriate  Engagement in Group:  Engaged  Modes of Intervention:  Education  Additional Comments:    Avelina DELENA Humphreys 11/20/2023, 12:27 PM

## 2023-11-20 NOTE — Progress Notes (Signed)
 Collateral contact - [facility] Tully Regal - ACTT Therapist/Team Lead [phone]  Tully said she will call patient after she is discharged tomorrow, Saturday (11/1), and will visit her on Sunday.  CSW relayed information to patient.    Taniyah Ballow, LCSWA 11/20/2023

## 2023-11-20 NOTE — Progress Notes (Signed)
(  Sleep Hours) - 10.5 (Any PRNs that were needed, meds refused, or side effects to meds)- PRN vistaril  50 mg and trazodone  50 mg given at pt request, no meds refused.  (Any disturbances and when (visitation, over night)- None  (Concerns raised by the patient)- None (SI/HI/AVH)- Denies SI/HI/AVH

## 2023-11-20 NOTE — Plan of Care (Signed)

## 2023-11-20 NOTE — Group Note (Signed)
 Recreation Therapy Group Note   Group Topic:Problem Solving  Group Date: 11/20/2023 Start Time: 0934 End Time: 1000 Facilitators: Luisfernando Brightwell-McCall, LRT,CTRS Location: [address] Dayroom   Group Topic: Halloween Trivia  Goal Area(s) Addresses:  Patient will effectively work in a team with other group members. Patient will verbalize importance of using appropriate problem solving techniques.   Behavioral Response: Attentive  Intervention: Trivia  Activity: Set Designer. Patients worked together in teams. LRT read questions from 6 different categories (Classic Horror, Monster Movies, Slasher Flicks, Behind-the-Scenes, Caution! Plot and Beware the Grab Bag). Each group was to right down their answers. LRT would go over the answers with patients at the end of each category. The team with the most combined points (point total from each category) wins the game.    Education: Journalist, Newspaper, Team Building, Communication  Education Outcome: Acknowledges understanding/In group clarification offered/Needs additional education.    Affect/Mood: Appropriate   Participation Level: Moderate   Participation Quality: Independent   Behavior: Appropriate   Speech/Thought Process: Relevant   Insight: Moderate   Judgement: Moderate   Modes of Intervention: Competitive Play   Patient Response to Interventions:  Receptive   Education Outcome:  In group clarification offered    Clinical Observations/Individualized Feedback: Pt was attentive to peers figuring out the answers. Pt would make a guess every now and then but was mostly attentive to peers.      Plan: Continue to engage patient in RT group sessions 2-3x/week.   Janet Welch, LRT,CTRS 11/20/2023 12:30 PM

## 2023-11-20 NOTE — BH IP Treatment Plan (Signed)
 Interdisciplinary Treatment and Diagnostic Plan Update  11/20/2023 Time of Session: 12:15 PM - UPDATE KHALEELAH YOWELL MRN: [id number]  Principal Diagnosis: Bipolar I disorder, current or most recent episode manic, severe with mood-congruent psychotic features (HCC)  Secondary Diagnoses: Principal Problem:   Bipolar I disorder, current or most recent episode manic, severe with mood-congruent psychotic features (HCC)   Current Medications:  Current Facility-Administered Medications  Medication Dose Route Frequency Provider Last Rate Last Admin   acetaminophen  (TYLENOL ) tablet 650 mg  650 mg Oral Q6H PRN Motley-Mangrum, Jadeka A, PMHNP       alum & mag hydroxide-simeth (MAALOX/MYLANTA) 200-200-20 MG/5ML suspension 30 mL  30 mL Oral Q4H PRN Motley-Mangrum, Jadeka A, PMHNP       ARIPiprazole  (ABILIFY ) tablet 15 mg  15 mg Oral Daily McCarty, Artie, MD   15 mg at 11/20/23 0800   ARIPiprazole  ER (ABILIFY  MAINTENA) injection 300 mg  300 mg Intramuscular Q28 days McCarty, Artie, MD   300 mg at 11/20/23 0801   haloperidol (HALDOL) tablet 5 mg  5 mg Oral TID PRN Motley-Mangrum, Jadeka A, PMHNP       And   diphenhydrAMINE  (BENADRYL ) capsule 50 mg  50 mg Oral TID PRN Motley-Mangrum, Jadeka A, PMHNP       haloperidol lactate (HALDOL) injection 5 mg  5 mg Intramuscular TID PRN Motley-Mangrum, Jadeka A, PMHNP       And   diphenhydrAMINE  (BENADRYL ) injection 50 mg  50 mg Intramuscular TID PRN Motley-Mangrum, Jadeka A, PMHNP       And   LORazepam  (ATIVAN ) injection 2 mg  2 mg Intramuscular TID PRN Motley-Mangrum, Jadeka A, PMHNP       haloperidol lactate (HALDOL) injection 10 mg  10 mg Intramuscular TID PRN Motley-Mangrum, Jadeka A, PMHNP       And   diphenhydrAMINE  (BENADRYL ) injection 50 mg  50 mg Intramuscular TID PRN Motley-Mangrum, Jadeka A, PMHNP       And   LORazepam  (ATIVAN ) injection 2 mg  2 mg Intramuscular TID PRN Motley-Mangrum, Jadeka A, PMHNP       hydrOXYzine  (ATARAX ) tablet 50 mg  50 mg  Oral Q6H PRN Motley-Mangrum, Jadeka A, PMHNP   50 mg at 11/19/23 2116   magnesium  hydroxide (MILK OF MAGNESIA) suspension 30 mL  30 mL Oral Daily PRN Motley-Mangrum, Jadeka A, PMHNP       polyethylene glycol (MIRALAX  / GLYCOLAX ) packet 17 g  17 g Oral Daily McCarty, Artie, MD   17 g at 11/18/23 9166   traZODone  (DESYREL ) tablet 100 mg  100 mg Oral QHS PRN,MR X 1 McCarty, Artie, MD       PTA Medications: Medications Prior to Admission  Medication Sig Dispense Refill Last Dose/Taking   divalproex  (DEPAKOTE  ER) 250 MG 24 hr tablet Take 2 tablets (500 mg total) by mouth daily. (Patient not taking: Reported on 11/10/2023) 4 tablet 0    melatonin 3 MG TABS tablet Take 1 tablet (3 mg total) by mouth at bedtime. (Patient not taking: Reported on 11/10/2023) 30 tablet 0    OLANZapine  zydis (ZYPREXA  ZYDIS) 20 MG disintegrating tablet Take 1 tablet (20 mg total) by mouth at bedtime. (Patient not taking: Reported on 11/10/2023) 2 tablet 0    Oxcarbazepine (TRILEPTAL) 300 MG tablet Take 300 mg by mouth at bedtime.      polyethylene glycol (MIRALAX  / GLYCOLAX ) 17 g packet Take 17 g by mouth daily as needed for moderate constipation. (Patient not taking: Reported on 11/10/2023) 30 each  0     Patient Stressors:    Patient Strengths:    Treatment Modalities: Medication Management, Group therapy, Case management,  1 to 1 session with clinician, Psychoeducation, Recreational therapy.   Physician Treatment Plan for Primary Diagnosis: Bipolar I disorder, current or most recent episode manic, severe with mood-congruent psychotic features (HCC) Long Term Goal(s): Improvement in symptoms so as ready for discharge   Short Term Goals: Ability to identify changes in lifestyle to reduce recurrence of condition will improve Ability to verbalize feelings will improve Ability to disclose and discuss suicidal ideas Ability to demonstrate self-control will improve Ability to identify and develop effective coping  behaviors will improve Ability to maintain clinical measurements within normal limits will improve Compliance with prescribed medications will improve Ability to identify triggers associated with substance abuse/mental health issues will improve  Medication Management: Evaluate patient's response, side effects, and tolerance of medication regimen.  Therapeutic Interventions: 1 to 1 sessions, Unit Group sessions and Medication administration.  Evaluation of Outcomes: Progressing  Physician Treatment Plan for Secondary Diagnosis: Principal Problem:   Bipolar I disorder, current or most recent episode manic, severe with mood-congruent psychotic features (HCC)  Long Term Goal(s): Improvement in symptoms so as ready for discharge   Short Term Goals: Ability to identify changes in lifestyle to reduce recurrence of condition will improve Ability to verbalize feelings will improve Ability to disclose and discuss suicidal ideas Ability to demonstrate self-control will improve Ability to identify and develop effective coping behaviors will improve Ability to maintain clinical measurements within normal limits will improve Compliance with prescribed medications will improve Ability to identify triggers associated with substance abuse/mental health issues will improve     Medication Management: Evaluate patient's response, side effects, and tolerance of medication regimen.  Therapeutic Interventions: 1 to 1 sessions, Unit Group sessions and Medication administration.  Evaluation of Outcomes: Progressing   RN Treatment Plan for Primary Diagnosis: Bipolar I disorder, current or most recent episode manic, severe with mood-congruent psychotic features (HCC) Long Term Goal(s): Knowledge of disease and therapeutic regimen to maintain health will improve  Short Term Goals: Ability to remain free from injury will improve, Ability to verbalize frustration and anger appropriately will improve, Ability  to verbalize feelings will improve, and Ability to disclose and discuss suicidal ideas  Medication Management: RN will administer medications as ordered by provider, will assess and evaluate patient's response and provide education to patient for prescribed medication. RN will report any adverse and/or side effects to prescribing provider.  Therapeutic Interventions: 1 on 1 counseling sessions, Psychoeducation, Medication administration, Evaluate responses to treatment, Monitor vital signs and CBGs as ordered, Perform/monitor CIWA, COWS, AIMS and Fall Risk screenings as ordered, Perform wound care treatments as ordered.  Evaluation of Outcomes: Progressing   LCSW Treatment Plan for Primary Diagnosis: Bipolar I disorder, current or most recent episode manic, severe with mood-congruent psychotic features (HCC) Long Term Goal(s): Safe transition to appropriate next level of care at discharge, Engage patient in therapeutic group addressing interpersonal concerns.  Short Term Goals: Engage patient in aftercare planning with referrals and resources, Increase ability to appropriately verbalize feelings, Facilitate acceptance of mental health diagnosis and concerns, and Identify triggers associated with mental health/substance abuse issues  Therapeutic Interventions: Assess for all discharge needs, 1 to 1 time with Social worker, Explore available resources and support systems, Assess for adequacy in community support network, Educate family and significant other(s) on suicide prevention, Complete Psychosocial Assessment, Interpersonal group therapy.  Evaluation of Outcomes: Progressing  Progress in Treatment: Attending groups: Yes. Participating in groups: Yes. Taking medication as prescribed: Yes Toleration medication: Yes Family/Significant other contact made: Yes, contacted:  Arleta Ostrum (husband) [phone].   Patient understands diagnosis: No. Discussing patient identified problems/goals  with staff: Yes. Medical problems stabilized or resolved: Yes. Denies suicidal/homicidal ideation: Yes. Issues/concerns per patient self-inventory: No.   New problem(s) identified: No, Describe:  none   New Short Term/Long Term Goal(s): medication stabilization, elimination of SI thoughts, development of comprehensive mental wellness plan.      Patient Goals:  Get medications right   Discharge Plan or Barriers: Patient recently admitted. CSW will continue to follow and assess for appropriate referrals and possible discharge planning.      Reason for Continuation of Hospitalization: Depression Hallucinations Medication stabilization   Estimated Length of Stay: 1 - 2 days  Last 3 [place] Suicide Severity Risk Score: Flowsheet Row Ad[facility] (Current) [facility]2023 [facility] (Discharged) from OP [facility]  C-SSRS RISK CATEGORY Low Risk No Risk No Risk    Last PHQ 2/9 Scores:    02/12/2023    8:22 AM 02/25/2021    6:28 PM 02/20/2021    2:22 PM  Depression screen PHQ 2/9  Decreased Interest 0 2 2  Down, Depressed, Hopeless 0 2 2  PHQ - 2 Score 0 4 4  Altered sleeping  1 3  Tired, decreased energy  1 3  Change in appetite  1 0  Feeling bad or failure about yourself   2 2  Trouble concentrating  2 2  Moving slowly or fidgety/restless  2 3  Suicidal thoughts  0 0  PHQ-9 Score  13 17  Difficult doing work/chores  Somewhat difficult Somewhat difficult    Scribe for Treatment Team: Takiesha Mcdevitt O Gustie Bobb, LCSWA 11/20/2023 7:41 PM

## 2023-11-21 MED ORDER — TRAZODONE HCL 50 MG PO TABS: 100.0000 mg | ORAL_TABLET | Freq: Every day | ORAL | 0 refills | Status: DC

## 2023-11-21 MED ORDER — ARIPIPRAZOLE ER 400 MG IM SRER: 300.0000 mg | INTRAMUSCULAR | Status: DC

## 2023-11-21 MED ORDER — ARIPIPRAZOLE 15 MG PO TABS: 15.0000 mg | ORAL_TABLET | Freq: Every day | ORAL | 0 refills | Status: DC

## 2023-11-21 MED ORDER — HYDROXYZINE HCL 50 MG PO TABS: 50.0000 mg | ORAL_TABLET | Freq: Four times a day (QID) | ORAL | 0 refills | Status: DC | PRN
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
## 2023-11-21 NOTE — Progress Notes (Signed)
  [facility] Adult Case Management Discharge Plan :  Will you be returning to the same living situation after discharge:  Yes,  the paitent will be going home with husband At discharge, do you have transportation home?: Yes,  the patient's husband will be pick her up Do you have the ability to pay for your medications: Yes,  the patient stated that she has insurance   Release of information consent forms completed and in the chart;  Patient's signature needed at discharge.  Patient to Follow up at:  Follow-up Information     [facility] Follow up on 11/27/2023.   Why: You have an appointment for medication management services on 11/27/23 at 8:45 am., Virtual. * Please bring a copy of your discharge paperwork to your appt. Contact information: [address] [place], [place] [address]   PHONE: [phone] FAX: [phone]        Allean Lisbeth Rigg, Counselor. Schedule an appointment as soon as possible for a visit.   Why: You may call your provider to schedule an appointment for therapy services.[facility] information: [phone] [place], [place] [address]  P:  [phone]        [facility] Follow up.   Why: A referral was sent on 11/12/2023.  Please contact the provider on 11/23/2023 at 9 AM for an update on the status of the referral. Contact information: [facility] ([facility]) Tully Small - Therapist/Team Lead Phone #:  [phone]                Next level of care provider has access to [facility] Link:no  Safety Planning and Suicide Prevention discussed: Yes,  CSW spoke tp husband on SPE     Has patient been referred to the Quitline?: Patient refused referral for treatment  Patient has been referred for addiction treatment: No known substance use disorder.  Feiga Nadel O Rawley Harju, LCSWA 11/21/2023, 8:59 AM

## 2023-11-21 NOTE — Progress Notes (Signed)
 Patient verbalizes readiness for discharge. All patient belongings returned to patient. Discharge instructions read and discussed with patient (appointments, medications, resources). Patient expressed gratitude for care provided. Patient discharged to lobby at 0940 where her husband was waiting.

## 2023-11-21 NOTE — Group Note (Signed)
 Date:  11/21/2023 Time:  9:28 AM  Group Topic/Focus:  Goals Group:   The focus of this group is to help patients establish daily goals to achieve during treatment and discuss how the patient can incorporate goal setting into their daily lives to aide in recovery.    Participation Level:  Did Not Attend  Participation Quality:  N/A  Affect:  N/A  Cognitive:  N/A  Insight: None  Engagement in Group:  None  Modes of Intervention:  N/A  Additional Comments:  Bren did not attend goals group.  Kristi HERO Shantell Belongia 11/21/2023, 9:28 AM

## 2023-11-21 NOTE — Progress Notes (Signed)
 Received verbal report from [employer]. Agree with all pm assessments completed by Heather. Patient continues to be under q4min safety checks and remains safe on the unit.

## 2023-11-21 NOTE — Plan of Care (Signed)
   Problem: Education: Goal: Emotional status will improve Outcome: Progressing Goal: Mental status will improve Outcome: Progressing Goal: Verbalization of understanding the information provided will improve Outcome: Progressing
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
## 2023-11-21 NOTE — Progress Notes (Signed)
(  Sleep Hours) -8  (Any PRNs that were needed, meds refused, or side effects to meds)- hydroxyzine  50mg , Trazodone  100mg   (Any disturbances and when (visitation, over night)-none  (Concerns raised by the patient)- none  (SI/HI/AVH)-denies

## 2023-11-25 ENCOUNTER — Telehealth: Payer: Self-pay

## 2023-11-25 NOTE — Telephone Encounter (Signed)
 Copied from CRM [id number]. Topic: Appointments - Transfer of Care >> Nov 25, 2023 11:32 AM Shanda MATSU wrote: Pt is requesting to transfer FROM: Dr. Georgina Pt is requesting to transfer TO: Dr. Kayla Reason for requested transfer: looking for new provider It is the responsibility of the team the patient would like to transfer to (Dr. Kayla) to reach out to the patient if for any reason this transfer is not acceptable.

## 2023-11-25 NOTE — Telephone Encounter (Unsigned)
 Copied from CRM [id number]. Topic: Appointments - Transfer of Care >> Nov 25, 2023 11:32 AM Shanda MATSU wrote: Pt is requesting to transfer FROM: Dr. Georgina Pt is requesting to transfer TO: Dr. Kayla Reason for requested transfer: looking for new provider It is the responsibility of the team the patient would like to transfer to (Dr. Kayla) to reach out to the patient if for any reason this transfer is not acceptable.

## 2023-11-30 ENCOUNTER — Encounter: Admitting: Family Medicine

## 2023-12-02 ENCOUNTER — Telehealth: Payer: Self-pay

## 2023-12-02 NOTE — Telephone Encounter (Signed)
 Copied from CRM #[id number]. Topic: Appointments - Scheduling Inquiry for Clinic >> Dec 02, 2023 10:23 AM Charlet HERO wrote: Reason for CRM: Patient needs visit within 14 days was released from hospital on 11/01 scheduled for 11/19 please call patient back with earlier appt

## 2023-12-03 ENCOUNTER — Encounter (HOSPITAL_BASED_OUTPATIENT_CLINIC_OR_DEPARTMENT_OTHER): Payer: Self-pay

## 2023-12-09 ENCOUNTER — Ambulatory Visit (INDEPENDENT_AMBULATORY_CARE_PROVIDER_SITE_OTHER): Payer: MEDICAID | Admitting: Family Medicine

## 2023-12-09 ENCOUNTER — Encounter: Payer: Self-pay | Admitting: Family Medicine

## 2023-12-09 VITALS — BP 116/64 | HR 94 | Temp 98.3°F | Ht 67.0 in | Wt 173.0 lb

## 2023-12-09 DIAGNOSIS — Z2821 Immunization not carried out because of patient refusal: Secondary | ICD-10-CM | POA: Diagnosis not present

## 2023-12-09 DIAGNOSIS — F312 Bipolar disorder, current episode manic severe with psychotic features: Secondary | ICD-10-CM | POA: Diagnosis not present

## 2023-12-09 DIAGNOSIS — E559 Vitamin D deficiency, unspecified: Secondary | ICD-10-CM | POA: Diagnosis not present

## 2023-12-09 DIAGNOSIS — Z1231 Encounter for screening mammogram for malignant neoplasm of breast: Secondary | ICD-10-CM

## 2023-12-09 DIAGNOSIS — Z23 Encounter for immunization: Secondary | ICD-10-CM | POA: Diagnosis not present

## 2023-12-09 DIAGNOSIS — F332 Major depressive disorder, recurrent severe without psychotic features: Secondary | ICD-10-CM

## 2023-12-09 DIAGNOSIS — Z1159 Encounter for screening for other viral diseases: Secondary | ICD-10-CM | POA: Diagnosis not present

## 2023-12-09 NOTE — Progress Notes (Signed)
 I,Jameka J Llittleton, CMA,acting as a neurosurgeon for [employer], NP.,have documented all relevant documentation on the behalf of Bruna Creighton, NP,as directed by  Bruna Creighton, NP while in the presence of Bruna Creighton, NP.  Subjective:  Patient ID: Janet Welch , female    DOB: [birth year] , 48 y.o.   MRN: [id number]  Chief Complaint  Patient presents with   hospital f/u    Patient presents today for a hospital f/u. Patient was admitted into behavioral health on 10/21 and discharged on 11/21/23. Patient reports she is feeling better.     HPI Discussed the use of AI scribe software for clinical note transcription with the patient, who gave verbal consent to proceed.  History of Present Illness     Janet Welch is a 48 year old female who presents for a hospital follow-up after a psychiatric crisis.  Approximately two weeks ago, she was hospitalized due to a psychiatric crisis characterized by auditory hallucinations and suicidal ideation. Since her discharge, she feels better and no longer experiences suicidal thoughts or auditory hallucinations. The hallucinations previously felt 'real' and led her to 'doing weird stuff.'  Her medication regimen was adjusted during her hospital stay. She is currently taking mirtazapine and benztropine , along with magnesium  for sleep. She also receives Abilify  injections at home through a program called [facility], which visits her weekly to administer the medication and provide support. This service was arranged during her hospital stay.  She took a CBD gummy about a week ago, which she discussed with her medication prescriber at [facility], who approved its use for sleep. She reports improved sleep but still experiences some restlessness.  Her bowel movements have become more regular since her hospital stay, now occurring every few days instead of once a week.      Past Medical History:  Diagnosis Date   Bipolar 1 disorder (HCC)     No pertinent past medical history      Family History  Problem Relation Age of Onset   Thyroid  disease Mother    Mental illness Mother    Parkinson's disease Father    Stroke Maternal Grandmother    Stroke Maternal Grandfather      Current Outpatient Medications:    benztropine  (COGENTIN ) 0.5 MG tablet, Take 0.5 mg by mouth 2 (two) times daily., Disp: , Rfl:    mirtazapine (REMERON) 15 MG tablet, Take 15 mg by mouth at bedtime., Disp: , Rfl:    Allergies  Allergen Reactions   Bee Venom Dermatitis     Review of Systems  Constitutional: Negative.   HENT: Negative.    Respiratory: Negative.    Cardiovascular: Negative.   Genitourinary: Negative.   Musculoskeletal: Negative.   Neurological: Negative.  Negative for dizziness, seizures, light-headedness and headaches.  Psychiatric/Behavioral:  Positive for behavioral problems. Negative for agitation and sleep disturbance.      Today's Vitals   12/09/23 1417  BP: 116/64  Pulse: 94  Temp: 98.3 F (36.8 C)  TempSrc: Oral  Weight: 173 lb (78.5 kg)  Height: 5' 7 (1.702 m)  PainSc: 0-No pain   Body mass index is 27.1 kg/m.  Wt Readings from Last 3 Encounters:  12/09/23 173 lb (78.5 kg)  02/12/23 202 lb 9.6 oz (91.9 kg)  03/20/21 168 lb (76.2 kg)    The 10-year ASCVD risk score (Arnett DK, et al., 2019) is: 0.4%   Values used to calculate the score:     Age: 50 years  Clincally relevant sex: Female     Is Non-Hispanic African American: No     Diabetic: No     Tobacco smoker: No     Systolic Blood Pressure: 116 mmHg     Is BP treated: No     HDL Cholesterol: 62 mg/dL     Total Cholesterol: 146 mg/dL  Objective:  Physical Exam HENT:     Head: Normocephalic.  Cardiovascular:     Rate and Rhythm: Regular rhythm.  Pulmonary:     Effort: Pulmonary effort is normal.     Breath sounds: Normal breath sounds.  Neurological:     Mental Status: She is alert and oriented to person, place, and time.  Psychiatric:         Mood and Affect: Mood normal.        Behavior: Behavior is cooperative.         Assessment And Plan:   Assessment & Plan Bipolar I disorder, current or most recent episode manic, severe with mood-congruent psychotic features (HCC) Recent hospitalization for manic episode with psychotic features and suicidal ideation. Improved symptoms with current medication regimen. Emphasized medication adherence to prevent crises. - Continue mirtazapine, benztropine , and Abilify  injections. - Ensure regular administration of Abilify  injections through home visits. - Encouraged adherence to medication regimen. Screening mammogram for breast cancer ordered Need for Tdap vaccination Due for Tdap vaccine.  Vitamin D  deficiency Discussed vitamin D 's role in mental health and benefits of sun exposure. - Checked vitamin D  levels. Encounter for hepatitis C screening test for low risk patient Check lab Need for hepatitis B screening test Check lab Influenza vaccination declined   Orders Placed This Encounter  Procedures   MM 3D SCREENING MAMMOGRAM BILATERAL BREAST   Tdap vaccine greater than or equal to 7yo IM   Vitamin D  (25 hydroxy)   Hepatitis B Surface Antibody   Hepatitis C antibody    Return for keep next appt.  Patient was given opportunity to ask questions. Patient verbalized understanding of the plan and was able to repeat key elements of the plan. All questions were answered to their satisfaction.    I, Bruna Creighton, NP, have reviewed all documentation for this visit. The documentation on 12/21/2023 for the exam, diagnosis, procedures, and orders are all accurate and complete.    IF YOU HAVE BEEN REFERRED TO A SPECIALIST, IT MAY TAKE 1-2 WEEKS TO SCHEDULE/PROCESS THE REFERRAL. IF YOU HAVE NOT HEARD FROM US /SPECIALIST IN TWO WEEKS, PLEASE GIVE US  A CALL AT [phone].

## 2023-12-10 LAB — HEPATITIS C ANTIBODY: Hep C Virus Ab: NONREACTIVE

## 2023-12-10 LAB — VITAMIN D 25 HYDROXY (VIT D DEFICIENCY, FRACTURES): Vit D, 25-Hydroxy: 27.9 ng/mL — ABNORMAL LOW (ref 30.0–100.0)

## 2023-12-10 LAB — HEPATITIS B SURFACE ANTIBODY,QUALITATIVE

## 2023-12-21 ENCOUNTER — Ambulatory Visit: Payer: Self-pay | Admitting: Family Medicine

## 2023-12-21 DIAGNOSIS — Z1231 Encounter for screening mammogram for malignant neoplasm of breast: Secondary | ICD-10-CM | POA: Insufficient documentation

## 2023-12-21 DIAGNOSIS — E559 Vitamin D deficiency, unspecified: Secondary | ICD-10-CM | POA: Insufficient documentation

## 2023-12-21 DIAGNOSIS — Z23 Encounter for immunization: Secondary | ICD-10-CM | POA: Insufficient documentation

## 2023-12-21 DIAGNOSIS — Z2821 Immunization not carried out because of patient refusal: Secondary | ICD-10-CM | POA: Insufficient documentation

## 2023-12-21 DIAGNOSIS — Z1159 Encounter for screening for other viral diseases: Secondary | ICD-10-CM | POA: Insufficient documentation

## 2023-12-21 MED ORDER — VITAMIN D (ERGOCALCIFEROL) 1.25 MG (50000 UNIT) PO CAPS: 50000.0000 [IU] | ORAL_CAPSULE | ORAL | 0 refills | Status: AC

## 2023-12-21 NOTE — Assessment & Plan Note (Signed)
 Recent hospitalization for manic episode with psychotic features and suicidal ideation. Improved symptoms with current medication regimen. Emphasized medication adherence to prevent crises. - Continue mirtazapine, benztropine , and Abilify  injections. - Ensure regular administration of Abilify  injections through home visits. - Encouraged adherence to medication regimen.

## 2023-12-21 NOTE — Progress Notes (Signed)
 Your vitamin D level is low, 50,000 units of vitamin D has been sent to the pharmacy. Take one caplet weekly .    Thanks!

## 2023-12-21 NOTE — Assessment & Plan Note (Signed)
 Due for Tdap vaccine

## 2023-12-21 NOTE — Assessment & Plan Note (Signed)
 Check lab

## 2023-12-21 NOTE — Assessment & Plan Note (Signed)
 ordered

## 2023-12-21 NOTE — Assessment & Plan Note (Signed)
 Discussed vitamin D 's role in mental health and benefits of sun exposure. - Checked vitamin D  levels.

## 2024-01-25 ENCOUNTER — Ambulatory Visit

## 2024-01-25 DIAGNOSIS — Z1231 Encounter for screening mammogram for malignant neoplasm of breast: Secondary | ICD-10-CM

## 2024-01-27 ENCOUNTER — Other Ambulatory Visit: Payer: Self-pay | Admitting: Family Medicine

## 2024-01-27 DIAGNOSIS — R928 Other abnormal and inconclusive findings on diagnostic imaging of breast: Secondary | ICD-10-CM

## 2024-02-10 ENCOUNTER — Ambulatory Visit: Payer: MEDICAID

## 2024-02-10 ENCOUNTER — Ambulatory Visit
Admission: RE | Admit: 2024-02-10 | Discharge: 2024-02-10 | Disposition: A | Payer: MEDICAID | Source: Ambulatory Visit | Attending: Family Medicine | Admitting: Family Medicine

## 2024-02-10 DIAGNOSIS — R928 Other abnormal and inconclusive findings on diagnostic imaging of breast: Secondary | ICD-10-CM

## 2024-02-16 ENCOUNTER — Ambulatory Visit (HOSPITAL_BASED_OUTPATIENT_CLINIC_OR_DEPARTMENT_OTHER): Admitting: Obstetrics and Gynecology

## 2024-02-17 NOTE — Progress Notes (Unsigned)
 LILLETTE Kristeen JINNY Gladis, CMA,acting as a neurosurgeon for Gaines Ada, FNP.,have documented all relevant documentation on the behalf of Gaines Ada, FNP,as directed by  Gaines Ada, FNP while in the presence of Gaines Ada, FNP.  Subjective:    Patient ID: Janet Welch , female    DOB: 02-20-1975 , 49 y.o.   MRN: [id number]  No chief complaint on file.   HPI  HPI   Past Medical History:  Diagnosis Date   Bipolar 1 disorder (HCC)    No pertinent past medical history      Family History  Problem Relation Age of Onset   Thyroid  disease Mother    Mental illness Mother    Parkinson's disease Father    Stroke Maternal Grandmother    Stroke Maternal Grandfather     Current Medications[1]   Allergies[2]    The patient states she uses {contraceptive methods:5051} for birth control. No LMP recorded.. {Dysmenorrhea-menorrhagia:21918}. Negative for: breast discharge, breast lump(s), breast pain and breast self exam. Associated symptoms include abnormal vaginal bleeding. Pertinent negatives include abnormal bleeding (hematology), anxiety, decreased libido, depression, difficulty falling sleep, dyspareunia, history of infertility, nocturia, sexual dysfunction, sleep disturbances, urinary incontinence, urinary urgency, vaginal discharge and vaginal itching. Diet regular.The patient states her exercise level is    . The patient's tobacco use is: Tobacco Use History[3]. She has been exposed to passive smoke. The patient's alcohol use is:  Social History   Substance and Sexual Activity  Alcohol Use Yes   Alcohol/week: 0.0 - 1.0 standard drinks of alcohol   Comment: socially  . Additional information: Last pap ***, next one scheduled for ***.    Review of Systems   There were no vitals filed for this visit. There is no height or weight on file to calculate BMI.  Wt Readings from Last 3 Encounters:  12/09/23 173 lb (78.5 kg)  02/12/23 202 lb 9.6 oz (91.9 kg)  03/20/21 168 lb (76.2 kg)      Objective:  Physical Exam      Assessment And Plan:     Encounter for annual health examination  MDD (major depressive disorder), severe (HCC)  Bipolar I disorder, most recent episode mixed (HCC)  Vitamin D  deficiency     No follow-ups on file. Patient was given opportunity to ask questions. Patient verbalized understanding of the plan and was able to repeat key elements of the plan. All questions were answered to their satisfaction.   Gaines Ada, FNP  I, Gaines Ada, FNP, have reviewed all documentation for this visit. The documentation on 02/17/24 for the exam, diagnosis, procedures, and orders are all accurate and complete.     [1]  Current Outpatient Medications:    benztropine  (COGENTIN ) 0.5 MG tablet, Take 0.5 mg by mouth 2 (two) times daily., Disp: , Rfl:    mirtazapine (REMERON) 15 MG tablet, Take 15 mg by mouth at bedtime., Disp: , Rfl:    Vitamin D , Ergocalciferol , (DRISDOL ) 1.25 MG (50000 UNIT) CAPS capsule, Take 1 capsule (50,000 Units total) by mouth every 7 (seven) days., Disp: 12 capsule, Rfl: 0 [2]  Allergies Allergen Reactions   Bee Venom Dermatitis  [3]  Social History Tobacco Use  Smoking Status Never  Smokeless Tobacco Never

## 2024-02-18 ENCOUNTER — Encounter: Admitting: Nurse Practitioner

## 2024-02-18 ENCOUNTER — Encounter: Payer: Self-pay | Admitting: Nurse Practitioner

## 2024-02-18 DIAGNOSIS — E559 Vitamin D deficiency, unspecified: Secondary | ICD-10-CM

## 2024-02-18 DIAGNOSIS — Z0000 Encounter for general adult medical examination without abnormal findings: Secondary | ICD-10-CM

## 2024-02-18 DIAGNOSIS — F322 Major depressive disorder, single episode, severe without psychotic features: Secondary | ICD-10-CM

## 2024-02-18 DIAGNOSIS — F3160 Bipolar disorder, current episode mixed, unspecified: Secondary | ICD-10-CM

## 2024-02-19 ENCOUNTER — Ambulatory Visit (HOSPITAL_BASED_OUTPATIENT_CLINIC_OR_DEPARTMENT_OTHER): Payer: Medicaid Other | Admitting: Certified Nurse Midwife

## 2024-02-26 ENCOUNTER — Other Ambulatory Visit: Payer: Self-pay | Admitting: Family Medicine

## 2024-03-22 ENCOUNTER — Encounter: Admitting: Family Medicine

## 2024-03-24 ENCOUNTER — Ambulatory Visit (HOSPITAL_BASED_OUTPATIENT_CLINIC_OR_DEPARTMENT_OTHER): Admitting: Obstetrics and Gynecology

## 2024-05-25 ENCOUNTER — Encounter: Payer: MEDICAID | Admitting: Nurse Practitioner
